# Patient Record
Sex: Male | Born: 1960 | Race: Black or African American | Hispanic: No | Marital: Single | Smoking: Current every day smoker
Health system: Southern US, Community
[De-identification: ages and names within clinical notes are randomized; demographics above are authoritative.]

## PROBLEM LIST (undated history)

## (undated) DIAGNOSIS — I10 Essential (primary) hypertension: Secondary | ICD-10-CM

---

## 2006-02-06 ENCOUNTER — Ambulatory Visit: Payer: Self-pay | Admitting: Cardiology

## 2006-02-11 ENCOUNTER — Ambulatory Visit: Payer: Self-pay | Admitting: Cardiology

## 2006-02-12 ENCOUNTER — Ambulatory Visit: Payer: Self-pay | Admitting: Cardiology

## 2009-02-07 ENCOUNTER — Emergency Department (HOSPITAL_COMMUNITY): Admission: EM | Admit: 2009-02-07 | Discharge: 2009-02-07 | Payer: Self-pay | Admitting: Emergency Medicine

## 2009-02-08 ENCOUNTER — Ambulatory Visit: Payer: Self-pay | Admitting: Cardiology

## 2019-12-23 ENCOUNTER — Encounter (HOSPITAL_COMMUNITY): Payer: Self-pay | Admitting: Emergency Medicine

## 2019-12-23 ENCOUNTER — Other Ambulatory Visit: Payer: Self-pay

## 2019-12-23 ENCOUNTER — Inpatient Hospital Stay (HOSPITAL_COMMUNITY)
Admission: EM | Admit: 2019-12-23 | Discharge: 2019-12-31 | DRG: 474 | Disposition: A | Discharge status: Rehabilitation Facility | Payer: Medicaid Other | Attending: Internal Medicine | Admitting: Internal Medicine

## 2019-12-23 ENCOUNTER — Emergency Department (HOSPITAL_COMMUNITY): Payer: Medicaid Other

## 2019-12-23 DIAGNOSIS — I739 Peripheral vascular disease, unspecified: Secondary | ICD-10-CM

## 2019-12-23 DIAGNOSIS — F10230 Alcohol dependence with withdrawal, uncomplicated: Secondary | ICD-10-CM | POA: Diagnosis not present

## 2019-12-23 DIAGNOSIS — Z7902 Long term (current) use of antithrombotics/antiplatelets: Secondary | ICD-10-CM

## 2019-12-23 DIAGNOSIS — R739 Hyperglycemia, unspecified: Secondary | ICD-10-CM | POA: Diagnosis not present

## 2019-12-23 DIAGNOSIS — I70202 Unspecified atherosclerosis of native arteries of extremities, left leg: Secondary | ICD-10-CM | POA: Diagnosis present

## 2019-12-23 DIAGNOSIS — E538 Deficiency of other specified B group vitamins: Secondary | ICD-10-CM

## 2019-12-23 DIAGNOSIS — Z89612 Acquired absence of left leg above knee: Secondary | ICD-10-CM

## 2019-12-23 DIAGNOSIS — Z6826 Body mass index (BMI) 26.0-26.9, adult: Secondary | ICD-10-CM | POA: Diagnosis not present

## 2019-12-23 DIAGNOSIS — F1010 Alcohol abuse, uncomplicated: Secondary | ICD-10-CM | POA: Diagnosis not present

## 2019-12-23 DIAGNOSIS — I998 Other disorder of circulatory system: Secondary | ICD-10-CM | POA: Diagnosis present

## 2019-12-23 DIAGNOSIS — Y901 Blood alcohol level of 20-39 mg/100 ml: Secondary | ICD-10-CM | POA: Diagnosis present

## 2019-12-23 DIAGNOSIS — E43 Unspecified severe protein-calorie malnutrition: Secondary | ICD-10-CM | POA: Diagnosis present

## 2019-12-23 DIAGNOSIS — R7989 Other specified abnormal findings of blood chemistry: Secondary | ICD-10-CM | POA: Diagnosis present

## 2019-12-23 DIAGNOSIS — D473 Essential (hemorrhagic) thrombocythemia: Secondary | ICD-10-CM | POA: Diagnosis not present

## 2019-12-23 DIAGNOSIS — L89159 Pressure ulcer of sacral region, unspecified stage: Secondary | ICD-10-CM

## 2019-12-23 DIAGNOSIS — I96 Gangrene, not elsewhere classified: Secondary | ICD-10-CM | POA: Diagnosis present

## 2019-12-23 DIAGNOSIS — F17210 Nicotine dependence, cigarettes, uncomplicated: Secondary | ICD-10-CM | POA: Diagnosis present

## 2019-12-23 DIAGNOSIS — D75839 Thrombocytosis, unspecified: Secondary | ICD-10-CM

## 2019-12-23 DIAGNOSIS — I10 Essential (primary) hypertension: Secondary | ICD-10-CM | POA: Diagnosis not present

## 2019-12-23 DIAGNOSIS — G629 Polyneuropathy, unspecified: Secondary | ICD-10-CM | POA: Diagnosis present

## 2019-12-23 DIAGNOSIS — Z888 Allergy status to other drugs, medicaments and biological substances status: Secondary | ICD-10-CM

## 2019-12-23 DIAGNOSIS — L97429 Non-pressure chronic ulcer of left heel and midfoot with unspecified severity: Secondary | ICD-10-CM | POA: Diagnosis present

## 2019-12-23 DIAGNOSIS — K5900 Constipation, unspecified: Secondary | ICD-10-CM | POA: Diagnosis not present

## 2019-12-23 DIAGNOSIS — L89152 Pressure ulcer of sacral region, stage 2: Secondary | ICD-10-CM | POA: Diagnosis not present

## 2019-12-23 DIAGNOSIS — F10239 Alcohol dependence with withdrawal, unspecified: Secondary | ICD-10-CM | POA: Diagnosis present

## 2019-12-23 DIAGNOSIS — E872 Acidosis, unspecified: Secondary | ICD-10-CM

## 2019-12-23 DIAGNOSIS — Z7982 Long term (current) use of aspirin: Secondary | ICD-10-CM

## 2019-12-23 DIAGNOSIS — I70234 Atherosclerosis of native arteries of right leg with ulceration of heel and midfoot: Secondary | ICD-10-CM | POA: Diagnosis not present

## 2019-12-23 DIAGNOSIS — L89312 Pressure ulcer of right buttock, stage 2: Secondary | ICD-10-CM | POA: Diagnosis present

## 2019-12-23 DIAGNOSIS — Z20828 Contact with and (suspected) exposure to other viral communicable diseases: Secondary | ICD-10-CM | POA: Diagnosis present

## 2019-12-23 DIAGNOSIS — D539 Nutritional anemia, unspecified: Secondary | ICD-10-CM | POA: Diagnosis present

## 2019-12-23 DIAGNOSIS — L97529 Non-pressure chronic ulcer of other part of left foot with unspecified severity: Secondary | ICD-10-CM | POA: Diagnosis not present

## 2019-12-23 DIAGNOSIS — E162 Hypoglycemia, unspecified: Secondary | ICD-10-CM

## 2019-12-23 DIAGNOSIS — M869 Osteomyelitis, unspecified: Secondary | ICD-10-CM

## 2019-12-23 DIAGNOSIS — E785 Hyperlipidemia, unspecified: Secondary | ICD-10-CM | POA: Diagnosis present

## 2019-12-23 DIAGNOSIS — L89322 Pressure ulcer of left buttock, stage 2: Secondary | ICD-10-CM | POA: Diagnosis not present

## 2019-12-23 DIAGNOSIS — D62 Acute posthemorrhagic anemia: Secondary | ICD-10-CM | POA: Diagnosis present

## 2019-12-23 DIAGNOSIS — I7025 Atherosclerosis of native arteries of other extremities with ulceration: Secondary | ICD-10-CM | POA: Diagnosis present

## 2019-12-23 DIAGNOSIS — F10939 Alcohol use, unspecified with withdrawal, unspecified: Secondary | ICD-10-CM

## 2019-12-23 DIAGNOSIS — L97509 Non-pressure chronic ulcer of other part of unspecified foot with unspecified severity: Secondary | ICD-10-CM

## 2019-12-23 DIAGNOSIS — Z4781 Encounter for orthopedic aftercare following surgical amputation: Secondary | ICD-10-CM | POA: Diagnosis not present

## 2019-12-23 DIAGNOSIS — M86171 Other acute osteomyelitis, right ankle and foot: Principal | ICD-10-CM | POA: Diagnosis present

## 2019-12-23 DIAGNOSIS — Z79899 Other long term (current) drug therapy: Secondary | ICD-10-CM

## 2019-12-23 DIAGNOSIS — E871 Hypo-osmolality and hyponatremia: Secondary | ICD-10-CM | POA: Diagnosis present

## 2019-12-23 DIAGNOSIS — I70262 Atherosclerosis of native arteries of extremities with gangrene, left leg: Secondary | ICD-10-CM | POA: Diagnosis not present

## 2019-12-23 HISTORY — DX: Essential (primary) hypertension: I10

## 2019-12-23 LAB — CBG MONITORING, ED
Glucose-Capillary: 62 mg/dL — ABNORMAL LOW (ref 70–99)
Glucose-Capillary: 128 mg/dL — ABNORMAL HIGH (ref 70–99)

## 2019-12-23 LAB — CBC WITH DIFFERENTIAL/PLATELET
Abs Immature Granulocytes: 0.05 10*3/uL (ref 0.00–0.07)
Basophils Absolute: 0.1 10*3/uL (ref 0.0–0.1)
Basophils Relative: 1 %
Eosinophils Absolute: 0 10*3/uL (ref 0.0–0.5)
Eosinophils Relative: 0 %
HCT: 29.8 % — ABNORMAL LOW (ref 39.0–52.0)
Hemoglobin: 9.7 g/dL — ABNORMAL LOW (ref 13.0–17.0)
Immature Granulocytes: 1 %
Lymphocytes Relative: 16 %
Lymphs Abs: 1.2 10*3/uL (ref 0.7–4.0)
MCH: 33 pg (ref 26.0–34.0)
MCHC: 32.6 g/dL (ref 30.0–36.0)
MCV: 101.4 fL — ABNORMAL HIGH (ref 80.0–100.0)
Monocytes Absolute: 0.6 10*3/uL (ref 0.1–1.0)
Monocytes Relative: 8 %
Neutro Abs: 5.3 10*3/uL (ref 1.7–7.7)
Neutrophils Relative %: 74 %
Platelets: 533 10*3/uL — ABNORMAL HIGH (ref 150–400)
RBC: 2.94 MIL/uL — ABNORMAL LOW (ref 4.22–5.81)
RDW: 17.6 % — ABNORMAL HIGH (ref 11.5–15.5)
WBC: 7.1 10*3/uL (ref 4.0–10.5)
nRBC: 0 % (ref 0.0–0.2)

## 2019-12-23 LAB — BASIC METABOLIC PANEL
Anion gap: >20 — ABNORMAL HIGH (ref 5–15)
BUN: 8 mg/dL (ref 6–20)
CO2: 16 mmol/L — ABNORMAL LOW (ref 22–32)
Calcium: 8.5 mg/dL — ABNORMAL LOW (ref 8.9–10.3)
Chloride: 95 mmol/L — ABNORMAL LOW (ref 98–111)
Creatinine, Ser: 0.62 mg/dL (ref 0.61–1.24)
GFR calc Af Amer: >60 mL/min (ref >60)
GFR calc non Af Amer: >60 mL/min (ref >60)
Glucose, Bld: 59 mg/dL — ABNORMAL LOW (ref 70–99)
Potassium: 3.7 mmol/L (ref 3.5–5.1)
Sodium: 132 mmol/L — ABNORMAL LOW (ref 135–145)

## 2019-12-23 LAB — LACTIC ACID, PLASMA
Lactic Acid, Venous: 5.4 mmol/L (ref 0.5–1.9)
Lactic Acid, Venous: 2.1 mmol/L (ref 0.5–1.9)

## 2019-12-23 LAB — RESPIRATORY PANEL BY RT PCR (FLU A&B, COVID)
Influenza A by PCR: NEGATIVE
Influenza B by PCR: NEGATIVE
SARS Coronavirus 2 by RT PCR: NEGATIVE

## 2019-12-23 LAB — ETHANOL: Alcohol, Ethyl (B): 23 mg/dL — ABNORMAL HIGH (ref <10)

## 2019-12-23 MED ORDER — THIAMINE HCL 100 MG PO TABS
100.0000 mg | ORAL_TABLET | Freq: Every day | ORAL | Status: DC
  Administered 2019-12-23 – 2019-12-31 (×7): 100 mg via ORAL
  Filled 2019-12-23 (×8): qty 1

## 2019-12-23 MED ORDER — SODIUM CHLORIDE 0.9 % IV BOLUS
1000.0000 mL | Freq: Once | INTRAVENOUS | Status: AC
  Administered 2019-12-23: 1000 mL via INTRAVENOUS

## 2019-12-23 MED ORDER — LORAZEPAM 2 MG/ML IJ SOLN: 0.0000 mg | Freq: Four times a day (QID) | INTRAMUSCULAR | Status: DC

## 2019-12-23 MED ORDER — VANCOMYCIN HCL 1500 MG/300ML IV SOLN
1500.0000 mg | Freq: Once | INTRAVENOUS | Status: AC
  Administered 2019-12-23: 1500 mg via INTRAVENOUS
  Filled 2019-12-23: qty 300

## 2019-12-23 MED ORDER — LORAZEPAM 1 MG PO TABS
0.0000 mg | ORAL_TABLET | Freq: Two times a day (BID) | ORAL | Status: AC
  Administered 2019-12-26: 1 mg via ORAL
  Filled 2019-12-23: qty 1

## 2019-12-23 MED ORDER — CEFAZOLIN SODIUM-DEXTROSE 1-4 GM/50ML-% IV SOLN
2.0000 g | Freq: Once | INTRAVENOUS | Status: DC
  Filled 2019-12-23 (×2): qty 100

## 2019-12-23 MED ORDER — THIAMINE HCL 100 MG/ML IJ SOLN: 100.0000 mg | Freq: Every day | INTRAMUSCULAR | Status: DC

## 2019-12-23 MED ORDER — ACETAMINOPHEN 325 MG PO TABS
650.0000 mg | ORAL_TABLET | Freq: Four times a day (QID) | ORAL | Status: DC | PRN
  Administered 2019-12-24 – 2019-12-28 (×6): 650 mg via ORAL
  Filled 2019-12-23 (×6): qty 2

## 2019-12-23 MED ORDER — LORAZEPAM 2 MG/ML IJ SOLN
0.0000 mg | Freq: Two times a day (BID) | INTRAMUSCULAR | Status: AC
  Administered 2019-12-27: 1 mg via INTRAVENOUS
  Administered 2019-12-27: 2 mg via INTRAVENOUS
  Filled 2019-12-23 (×2): qty 1

## 2019-12-23 MED ORDER — LORAZEPAM 1 MG PO TABS: 0.0000 mg | ORAL_TABLET | Freq: Four times a day (QID) | ORAL | Status: DC

## 2019-12-23 MED ORDER — MORPHINE SULFATE (PF) 4 MG/ML IV SOLN
4.0000 mg | Freq: Once | INTRAVENOUS | Status: AC
  Administered 2019-12-23: 4 mg via INTRAVENOUS
  Filled 2019-12-23: qty 1

## 2019-12-23 MED ORDER — SODIUM CHLORIDE 0.9 % IV SOLN
2.0000 g | Freq: Once | INTRAVENOUS | Status: AC
  Administered 2019-12-23: 2 g via INTRAVENOUS
  Filled 2019-12-23: qty 2

## 2019-12-23 MED ORDER — ACETAMINOPHEN 650 MG RE SUPP: 650.0000 mg | Freq: Four times a day (QID) | RECTAL | Status: DC | PRN

## 2019-12-23 MED ORDER — VANCOMYCIN HCL IN DEXTROSE 1-5 GM/200ML-% IV SOLN
1000.0000 mg | Freq: Two times a day (BID) | INTRAVENOUS | Status: DC
  Administered 2019-12-24 – 2019-12-29 (×10): 1000 mg via INTRAVENOUS
  Filled 2019-12-23 (×11): qty 200

## 2019-12-23 NOTE — ED Notes (Signed)
Date and time results received: 12/23/19 2150   Test: Lactic Critical Value: 2.1  Name of Provider Notified: Josephine Cables, DO

## 2019-12-23 NOTE — Progress Notes (Addendum)
Pharmacy Antibiotic Note  Terrence Carr is a 58 y.o. male admitted on 12/23/2019 with wound infection.  Pharmacy has been consulted for vancomycin dosing.  Presenting with multiple wounds to bilateral extremities with noted foul odor and drainage. WBC 7.1, afebrile. Scr 0.62 (CrCl 88 mL/min).   Plan: Vancomycin 1500 mg IV once then 1 g IV every 12 hours (estAUC 476)  Monitor renal fx, cx results, clinical pic, and vanc levels as appropriate  Height: 5\' 6"  (167.6 cm) Weight: 165 lb (74.8 kg) IBW/kg (Calculated) : 63.8  Temp (24hrs), Avg:97.9 F (36.6 C), Min:97.9 F (36.6 C), Max:97.9 F (36.6 C)  Recent Labs  Lab 12/23/19 1854  WBC 7.1  CREATININE 0.62  LATICACIDVEN 5.4*    Estimated Creatinine Clearance: 88.6 mL/min (by C-G formula based on SCr of 0.62 mg/dL).    Not on File  Antimicrobials this admission: Vancomycin 12/23 >>  Cefepime 12/23 x1  Dose adjustments this admission: N/A  Microbiology results: N/A  Thank you for allowing pharmacy to be a part of this patient's care.  Antonietta Jewel, PharmD, BCCCP Clinical Pharmacist   Please check AMION for all [facility] phone numbers 12/23/2019 7:46 PM

## 2019-12-23 NOTE — ED Triage Notes (Signed)
Pt from home with girlfriend. Present with multiple wounds to both legs and feet.  Foul odor, drainage noted

## 2019-12-23 NOTE — H&P (Signed)
History and Physical  Terrence Carr [id number] DOB: Jul 23, 1961 DOA: 12/23/2019  Referring physician: Rodell Perna PA-C PCP: Patient, No Pcp Per  Patient coming from: Home  Chief Complaint: Bilateral wounds and pain on legs   HPI: Terrence Carr is a 58 y.o. male with medical history significant for hypertension, peripheral arterial occlusive disease, tobacco use disorder, alcohol abuse, peripheral neuropathy, vitamin B12 deficiency and history of severe protein calorie malnutrition who presents to the emergency department due to 3-week history of progressive and worsening wounds to bilateral lower extremities, patient complains of bilateral lower extremity wounds with yellowish and malodorous discharge, he complained of severe pain from the wounds.  Patient states that PCP prescribed antibiotic for the wounds, but he has not taken antibiotics since he cannot remember the name.  Patient was counseled against consequences of tobacco abuse including lower extremity amputatation based on his history of peripheral arterial disease by a physician, he states that he has cut down to half a pack of cigarettes daily, however he continues to drink alcohol (about a pint of vodka daily).  Patient states that he has been treating his wounds with Epson salt soaks and Goody powder without any improvement.  He denies chest pain, shortness of breath, fever, nausea, vomiting or abdominal pain. Of note, patient appears to have been seen by vascular surgery at [facility] as an outpatient on 09/02/2019 was noted to be status post right femoral endarterectomy and right iliac artery stent placement by Dr. Radene Knee in May 2020 in which he had no lower extremity wounds at that time by ED medical record.  ED Course:  In the emergency department, BP was 165/60, but other vital signs were within normal range.  Work-up in the ED showed macrocytic anemia,  Hypoglycemia, hyponatremia,  lactic acid 5.4> 2.1, alcohol level was 23.   COVID-19 virus test, influenza A and B were negative.  Right tibia and fibula showed diffuse edematous thickening of the RLE with a large ankle effusion and subcortical lucency at the distal tibial metaphysis worrisome for osteomyelitis.  Left foot x-ray showed soft tissue ulceration along the plantar aspect of the heel and posterolateral aspect of the foot.  Patient was started on IV cefepime and vancomycin.  Patient was placed on CIWA protocol and IV hydration of 1 L NS was given.  Dr. Donnetta Hutching (vascular surgeon at [facility]) was consulted per ED PA who states that it was recommended for patient to be transferred to [facility] with plan to see me in the morning.  Hospitalist was asked to admit for further evaluation management.   Review of Systems: Constitutional: Negative for chills and fever.  HENT: Negative for ear pain and sore throat.   Eyes: Negative for pain and visual disturbance.  Respiratory: Negative for cough, chest tightness and shortness of breath.   Cardiovascular: Negative for chest pain and palpitations.  Gastrointestinal: Negative for abdominal pain and vomiting.  Endocrine: Negative for polyphagia and polyuria.  Genitourinary: Negative for decreased urine volume, dysuria, enuresis Musculoskeletal: Positive for  Leg wounds and swelling. Negative for back pain.  Skin: Positive for wound.  Allergic/Immunologic: Negative for immunocompromised state.  Neurological: Negative for tremors, syncope, speech difficulty.  Hematological: Does not bruise/bleed easily.  Review of systems as noted in the HPI. All other systems reviewed and are negative.   History reviewed. No pertinent past medical history. History reviewed. No pertinent surgical history.  Social History:  reports that he has never smoked. He has never used smokeless tobacco.  He reports that he does not drink alcohol or use drugs.   Not on File  History reviewed. No pertinent family history.   Prior to Admission medications    Not on File    Physical Exam: BP (!) 160/83   Pulse 99   Temp 99.9 F (37.7 C) (Rectal)   Resp (!) 21   Ht 5\' 6"  (1.676 m)   Wt 74.8 kg   SpO2 97%   BMI 26.63 kg/m   . General: 58 y.o. year-old male well developed well nourished in no acute distress.  Alert and oriented x3. Marland Kitchen HEENT: Normocephalic, atraumatic . NECK: Supple, trachea midline . Cardiovascular: Regular rate and rhythm with no rubs or gallops.  No thyromegaly or JVD noted.  Bilateral +2 extremity edema. 1/4 pulses in  Right dorsalis pedis, left dorsalis pedis palpable only on doppler. Marland Kitchen Respiratory: Clear to auscultation with no wheezes or rales. Good inspiratory effort. . Abdomen: Soft nontender nondistended with normal bowel sounds x4 quadrants. . Muskuloskeletal: Bilateral lower extremity edema and wounds with malodorous purulent discharge.   . Neuro: CN II-XII intact, strength, sensation, reflexes . Skin: Numerous skin lesions with weeping and malodorous purulent discharge.   Marland Kitchen Psychiatry: Judgement and insight appear normal. Mood is appropriate for condition and setting          Labs on Admission:  Basic Metabolic Panel: Recent Labs  Lab 12/23/19 1854  NA 132*  K 3.7  CL 95*  CO2 16*  GLUCOSE 59*  BUN 8  CREATININE 0.62  CALCIUM 8.5*   Liver Function Tests: No results for input(s): AST, ALT, ALKPHOS, BILITOT, PROT, ALBUMIN in the last 168 hours. No results for input(s): LIPASE, AMYLASE in the last 168 hours. No results for input(s): AMMONIA in the last 168 hours. CBC: Recent Labs  Lab 12/23/19 1854  WBC 7.1  NEUTROABS 5.3  HGB 9.7*  HCT 29.8*  MCV 101.4*  PLT 533*   Cardiac Enzymes: No results for input(s): CKTOTAL, CKMB, CKMBINDEX, TROPONINI in the last 168 hours.  BNP (last 3 results) No results for input(s): BNP in the last 8760 hours.  ProBNP (last 3 results) No results for input(s): PROBNP in the last 8760 hours.  CBG: Recent Labs  Lab 12/23/19 2132 12/23/19 2312  GLUCAP  62* 128*    Radiological Exams on Admission: DG Tibia/Fibula Left  Result Date: 12/23/2019 CLINICAL DATA:  Multiple wounds. Assessment for osteomyelitis. EXAM: LEFT TIBIA AND FIBULA - 2 VIEW COMPARISON:  None FINDINGS: Diffuse mild soft tissue swelling of the left lower leg. Question some subcortical lucency along the distal fibular metadiaphysis with overlying material. If there is visible ulceration at this location, could reflect early features of osteomyelitis. No other erosion, destructive change, or immature periostitis to suggest a discrete focus of infection. Few fragments in the suprapatellar joint recess of the left knee, may be posttraumatic or degenerative in nature. Serpiginous sclerosis in the distal femur likely sequela of prior infarct. Atherosclerotic calcifications noted in the posterior knee. IMPRESSION: 1. Question subtle subcortical lucency along the distal fibular metadiaphysis with overlying bandage material. If there is visible ulceration at this location, could reflect early features of osteomyelitis. 2. No other erosion, destructive change, or immature periostitis to suggest a discrete focus of infection. Electronically Signed   By: Lovena Le M.D.   On: 12/23/2019 18:59   DG Tibia/Fibula Right  Result Date: 12/23/2019 CLINICAL DATA:  Wounds, assess for ostia EXAM: RIGHT TIBIA AND FIBULA - 2 VIEW COMPARISON:  Concurrent ankle radiographs FINDINGS: Diffuse edematous thickening of the right lower extremity which is increasing towards the level of the foot. There is circumferential swelling at the level of the ankle with a large effusion and some questionable gas suspicious lucency noted in the subcortical bone of the distal tibial metaphysis. Mild tricompartmental degenerative changes are seen at the knee. Vascular calcium in the soft tissues. No acute fracture or traumatic malalignment. IMPRESSION: 1. Diffuse edematous thickening of the right lower extremity which is increasing  towards the level of the foot. 2. There is a large ankle effusion, questionable gas in the joint, and circumferential swelling at the level of the ankle. 3. Subcortical lucency at the distal tibial metaphysis, worrisome for osteomyelitis. Electronically Signed   By: Lovena Le M.D.   On: 12/23/2019 19:09   DG Foot Complete Left  Result Date: 12/23/2019 CLINICAL DATA:  Wounds, assess for osteomyelitis EXAM: LEFT FOOT - COMPLETE 3+ VIEW COMPARISON:  None. FINDINGS: Diffuse soft tissue swelling of the foot. There is ulceration noted along the plantar aspect of the heel and posterolateral aspect of the foot. No subjacent osseous erosion, cortical lucency or immature periostitis to suggest site of osteomyelitis. No other radiographically evident site of ulceration. Vascular calcium noted in the soft tissues. Diffuse degenerative changes in the midfoot are noted most pronounced involving navicular, medial cuneiform and base of the first metatarsal. Clawtoe deformities of the second through fifth digits. IMPRESSION: 1. Soft tissue ulceration along the plantar aspect of the heel and posterolateral aspect of the foot. 2. No convincing radiographic evidence for osteomyelitis. If there is persisting concern contrast-enhanced MRI is more sensitive and specific. 3. Diffuse soft tissue swelling of the foot. 4. Diffuse degenerative changes in the midfoot. Electronically Signed   By: Lovena Le M.D.   On: 12/23/2019 19:02   DG Foot Complete Right  Result Date: 12/23/2019 CLINICAL DATA:  Wounds, assess for osteomyelitis. EXAM: RIGHT FOOT COMPLETE - 3+ VIEW COMPARISON:  None. FINDINGS: There is diffuse soft tissue swelling and ulceration of the foot at the level of the plantar calcaneus and along the medial aspect of the head of the first metatarsal. There is some transcortical lucency at the base of the first distal phalanx. The osseous structures appear diffusely demineralized which may limit detection of small or  nondisplaced fractures or subtle osseous features of osteomyelitis. There is questionable intra-articular gas at the level of the ankle. There is a severe hallux valgus deformity of the first ray with marked clawtoe deformities of the second through fifth digits. Extensive degenerative change noted through the mid and hindfoot as well as at the level of the ankle. No acute fracture or malalignment is seen. Ankle effusion is noted. IMPRESSION: 1. Diffuse soft tissue swelling and focal ulceration of the foot at the level of the plantar calcaneus and along the medial aspect of the head of the first metatarsal. 2. There is some transcortical lucency at the base of the first distal phalanx which could be secondary to osteomyelitis. 3. Question some intra-articular gas at the level of the ankle. Consider cross-sectional imaging. 4. Diffuse bony demineralization and degenerative changes, as above. Electronically Signed   By: Lovena Le M.D.   On: 12/23/2019 19:07    EKG: I independently viewed the EKG done and my findings are as followed: EKG was not done in the ED  Assessment/Plan Present on Admission: . Osteomyelitis of ankle or foot, acute, right (HCC)  Active Problems:   Osteomyelitis of ankle or  foot, acute, right (HCC)   Lactic acidosis   Alcohol abuse   Alcohol withdrawal (HCC)   Macrocytic anemia   Sacral decubitus ulcer   Hyperglycemia   Essential hypertension   Peripheral arterial disease (HCC)   Vitamin B12 deficiency   Thrombocytosis (HCC)   Hypoglycemia  Bilateral lower extremity wounds with suspicion for acute right ankle/foot osteomyelitis in the setting of history of peripheral arterial disease Soft tissue ulceration of left heel and posterior lateral aspect of foot X-ray of right tibia and fibula showed diffuse edematous thickening of the RLE with a large ankle effusion and subcortical lucency at the distal tibial metaphysis worrisome for osteomyelitis.   Left foot x-ray showed  soft tissue ulceration along the plantar aspect of the heel and posterolateral aspect of the foot.  Patient was started on IV vancomycin and cefepime; we shall continue with same at this time Vascular surgeon (Dr. Donnetta Hutching) at [facility] was consulted per ED PA and recommended for patient transfer for possible surgical intervention in the morning. Continue wound care Continue n.p.o. at midnight  Lactic acidosis possible secondary to above Lactic acid 5.4>2.1 s/p IV hydration Continue to trend lactic acid  Alcohol abuse/withdrawal Alcohol level was 23, patient states that he drinks about a pint of vodka daily. He denies history of alcohol withdrawal, patient was placed on CIWA protocol in the ED, we shall continue with same at this time.  Macrocytic anemia H/H 9.7/29.8, MCV 101.4  Folate and B12 level will be checked  Thrombocytosis possibly reactive Platelets from 533, stable with no obvious sign of acute bleeding Continue to monitor platelet levels with morning labs  Hypoglycemia-resolved Blood glucose on admission was 59, dysesthesias increased to 128 Continue to monitor blood glucose level  Sacral decubitus ulcer stage II Continue wound care  DVT prophylaxis: SCDs  Code Status: Full code  Family Communication: None at bedside  Disposition Plan: Transfer to [facility] for further evaluation and management.  Disposition pending surgical decision/outcome  Consults called: Vascular surgeon at [facility] per ED PA  Admission status: Inpatient    Bernadette Hoit MD [facility]  If 7PM-7AM, please contact night-coverage [email or web address]  12/24/2019, 1:00 AM

## 2019-12-23 NOTE — ED Provider Notes (Signed)
[facility] EMERGENCY DEPARTMENT Provider Note   CSN: [id number] Arrival date & time: 12/23/19  1700     History Chief Complaint  Patient presents with   Wound Check    Terrence Carr is a 58 y.o. male with history of peripheral arterial occlusive disease, hypertension, diverticulosis, tobacco use disorder, peripheral neuropathy, B12 deficiency with anemia, alcohol abuse, severe protein calorie malnutrition presents for evaluation of acute onset, progressively worsening wounds to the bilateral lower extremities for 3 weeks.  He reports severe pain to his lower extremities with wounds draining malodorous yellowish discharge, left worse than right.  Notes bilateral lower extremity edema that has also been worsening.  He states that he was seen by his PCP and is supposed to be on antibiotics but he cannot remember what antibiotics he is supposed to be on and has not been taking them.  He states that he was told by a physician at some point that "if I do not quit smoking that I will lose my legs".  He states that he has cut back on his smoking and he is now smoking around half a pack of cigarettes daily.  He also notes that he drinks around a pint of vodka daily.  Denies recreational drug use.  He has been soaking his wounds in Epson salt soaks and taking Goody's powder without improvement.  He denies fevers, chest pain, shortness of breath, abdominal pain, nausea, or vomiting.  Chart review shows that he was seen by vascular surgery through [facility] at an office visit on 09/02/2019 which notes that he is status post right femoral endarterectomy and right iliac artery stent placement by Dr. Radene Knee in March 2020.  At that time he did not have any wounds on his lower extremities.   The history is provided by the patient.       History reviewed. No pertinent past medical history.  There are no problems to display for this patient.   History reviewed. No pertinent surgical  history.     History reviewed. No pertinent family history.  Social History   Tobacco Use   Smoking status: Never Smoker   Smokeless tobacco: Never Used  Substance Use Topics   Alcohol use: Never   Drug use: Never    Home Medications Prior to Admission medications   Not on File    Allergies    Patient has no allergy information on record.  Review of Systems   Review of Systems  Constitutional: Negative for chills and fever.  Respiratory: Negative for shortness of breath.   Cardiovascular: Positive for leg swelling. Negative for chest pain.  Gastrointestinal: Negative for abdominal pain, nausea and vomiting.  Skin: Positive for wound.  All other systems reviewed and are negative.   Physical Exam Updated Vital Signs BP (!) 152/115    Pulse 98    Temp 97.9 F (36.6 C) (Oral)    Resp (!) 23    Ht 5\' 6"  (1.676 m)    Wt 74.8 kg    SpO2 100%    BMI 26.63 kg/m   Physical Exam Vitals and nursing note reviewed.  Constitutional:      General: He is not in acute distress.    Appearance: He is well-developed.  HENT:     Head: Normocephalic and atraumatic.  Eyes:     General:        Right eye: No discharge.        Left eye: No discharge.     Conjunctiva/sclera:  Conjunctivae normal.  Neck:     Vascular: No JVD.     Trachea: No tracheal deviation.  Cardiovascular:     Rate and Rhythm: Normal rate.     Comments: pitting edema of the bilateral lower extremities noted, right greater than left.  1+ DP pulse on the right, left DP pulse is not palpable but present on Doppler.  Numerous skin lesions with some necrosis, others weeping malodorous purulent discharge. Pulmonary:     Effort: Pulmonary effort is normal.  Abdominal:     General: There is no distension.  Musculoskeletal:     Cervical back: Neck supple.  Skin:    General: Skin is warm and dry.     Findings: Lesion present.  Neurological:     Mental Status: He is alert.  Psychiatric:        Behavior:  Behavior normal.           ED Results / Procedures / Treatments   Labs (all labs ordered are listed, but only abnormal results are displayed) Labs Reviewed  BASIC METABOLIC PANEL - Abnormal; Notable for the following components:      Result Value   Sodium 132 (*)    Chloride 95 (*)    CO2 16 (*)    Glucose, Bld 59 (*)    Calcium 8.5 (*)    Anion gap >20 (*)    All other components within normal limits  CBC WITH DIFFERENTIAL/PLATELET - Abnormal; Notable for the following components:   RBC 2.94 (*)    Hemoglobin 9.7 (*)    HCT 29.8 (*)    MCV 101.4 (*)    RDW 17.6 (*)    Platelets 533 (*)    All other components within normal limits  LACTIC ACID, PLASMA - Abnormal; Notable for the following components:   Lactic Acid, Venous 5.4 (*)    All other components within normal limits  ETHANOL - Abnormal; Notable for the following components:   Alcohol, Ethyl (B) 23 (*)    All other components within normal limits  RESPIRATORY PANEL BY RT PCR (FLU A&B, COVID)  LACTIC ACID, PLASMA  CBG MONITORING, ED    EKG None  Radiology DG Tibia/Fibula Left  Result Date: 12/23/2019 CLINICAL DATA:  Multiple wounds. Assessment for osteomyelitis. EXAM: LEFT TIBIA AND FIBULA - 2 VIEW COMPARISON:  None FINDINGS: Diffuse mild soft tissue swelling of the left lower leg. Question some subcortical lucency along the distal fibular metadiaphysis with overlying material. If there is visible ulceration at this location, could reflect early features of osteomyelitis. No other erosion, destructive change, or immature periostitis to suggest a discrete focus of infection. Few fragments in the suprapatellar joint recess of the left knee, may be posttraumatic or degenerative in nature. Serpiginous sclerosis in the distal femur likely sequela of prior infarct. Atherosclerotic calcifications noted in the posterior knee. IMPRESSION: 1. Question subtle subcortical lucency along the distal fibular metadiaphysis with  overlying bandage material. If there is visible ulceration at this location, could reflect early features of osteomyelitis. 2. No other erosion, destructive change, or immature periostitis to suggest a discrete focus of infection. Electronically Signed   By: Lovena Le M.D.   On: 12/23/2019 18:59   DG Tibia/Fibula Right  Result Date: 12/23/2019 CLINICAL DATA:  Wounds, assess for ostia EXAM: RIGHT TIBIA AND FIBULA - 2 VIEW COMPARISON:  Concurrent ankle radiographs FINDINGS: Diffuse edematous thickening of the right lower extremity which is increasing towards the level of the foot. There is circumferential  swelling at the level of the ankle with a large effusion and some questionable gas suspicious lucency noted in the subcortical bone of the distal tibial metaphysis. Mild tricompartmental degenerative changes are seen at the knee. Vascular calcium in the soft tissues. No acute fracture or traumatic malalignment. IMPRESSION: 1. Diffuse edematous thickening of the right lower extremity which is increasing towards the level of the foot. 2. There is a large ankle effusion, questionable gas in the joint, and circumferential swelling at the level of the ankle. 3. Subcortical lucency at the distal tibial metaphysis, worrisome for osteomyelitis. Electronically Signed   By: Lovena Le M.D.   On: 12/23/2019 19:09   DG Foot Complete Left  Result Date: 12/23/2019 CLINICAL DATA:  Wounds, assess for osteomyelitis EXAM: LEFT FOOT - COMPLETE 3+ VIEW COMPARISON:  None. FINDINGS: Diffuse soft tissue swelling of the foot. There is ulceration noted along the plantar aspect of the heel and posterolateral aspect of the foot. No subjacent osseous erosion, cortical lucency or immature periostitis to suggest site of osteomyelitis. No other radiographically evident site of ulceration. Vascular calcium noted in the soft tissues. Diffuse degenerative changes in the midfoot are noted most pronounced involving navicular, medial  cuneiform and base of the first metatarsal. Clawtoe deformities of the second through fifth digits. IMPRESSION: 1. Soft tissue ulceration along the plantar aspect of the heel and posterolateral aspect of the foot. 2. No convincing radiographic evidence for osteomyelitis. If there is persisting concern contrast-enhanced MRI is more sensitive and specific. 3. Diffuse soft tissue swelling of the foot. 4. Diffuse degenerative changes in the midfoot. Electronically Signed   By: Lovena Le M.D.   On: 12/23/2019 19:02   DG Foot Complete Right  Result Date: 12/23/2019 CLINICAL DATA:  Wounds, assess for osteomyelitis. EXAM: RIGHT FOOT COMPLETE - 3+ VIEW COMPARISON:  None. FINDINGS: There is diffuse soft tissue swelling and ulceration of the foot at the level of the plantar calcaneus and along the medial aspect of the head of the first metatarsal. There is some transcortical lucency at the base of the first distal phalanx. The osseous structures appear diffusely demineralized which may limit detection of small or nondisplaced fractures or subtle osseous features of osteomyelitis. There is questionable intra-articular gas at the level of the ankle. There is a severe hallux valgus deformity of the first ray with marked clawtoe deformities of the second through fifth digits. Extensive degenerative change noted through the mid and hindfoot as well as at the level of the ankle. No acute fracture or malalignment is seen. Ankle effusion is noted. IMPRESSION: 1. Diffuse soft tissue swelling and focal ulceration of the foot at the level of the plantar calcaneus and along the medial aspect of the head of the first metatarsal. 2. There is some transcortical lucency at the base of the first distal phalanx which could be secondary to osteomyelitis. 3. Question some intra-articular gas at the level of the ankle. Consider cross-sectional imaging. 4. Diffuse bony demineralization and degenerative changes, as above. Electronically  Signed   By: Lovena Le M.D.   On: 12/23/2019 19:07    Procedures .Critical Care Performed by: Renita Papa, PA-C Authorized by: Renita Papa, PA-C   Critical care provider statement:    Critical care time (minutes):  45   Critical care was necessary to treat or prevent imminent or life-threatening deterioration of the following conditions:  Circulatory failure (ischemic limb)   Critical care was time spent personally by me on the following activities:  Discussions with consultants, evaluation of patient's response to treatment, examination of patient, ordering and performing treatments and interventions, ordering and review of laboratory studies, ordering and review of radiographic studies, pulse oximetry, re-evaluation of patient's condition, obtaining history from patient or surrogate and review of old charts   (including critical care time)  Medications Ordered in ED Medications  vancomycin (VANCOREADY) IVPB 1500 mg/300 mL (1,500 mg Intravenous New Bag/Given 12/23/19 2010)  vancomycin (VANCOCIN) IVPB 1000 mg/200 mL premix (has no administration in time range)  morphine 4 MG/ML injection 4 mg (4 mg Intravenous Given 12/23/19 1818)  sodium chloride 0.9 % bolus 1,000 mL (1,000 mLs Intravenous New Bag/Given 12/23/19 2011)  ceFEPIme (MAXIPIME) 2 g in sodium chloride 0.9 % 100 mL IVPB (2 g Intravenous New Bag/Given 12/23/19 2053)    ED Course  I have reviewed the triage vital signs and the nursing notes.  Pertinent labs & imaging results that were available during my care of the patient were reviewed by me and considered in my medical decision making (see chart for details).    MDM Rules/Calculators/A&P                      Patient presenting for progressively worsening edema and wounds to the bilateral lower extremities, left worse than right.  Has a history of a right femoral endarterectomy and right iliac artery stent placement by a vascular surgeon at [facility] in  March 2020 has had some follow-up with vascular surgery there outpatient since then.  Most recently had follow-up in September of this year with no mention of lower extremity wounds.  He is afebrile, persistently hypertensive in the ED.  He is uncomfortable but nontoxic in appearance.  Diminished DP pulse on the right, unable to palpate DP pulse on the left but it is dopplerable.  Wounds are concerning for both wet and dry gangrene particularly along the lateral left foot and ankle.  Will obtain lab work and radiographs to assess for possible bony involvement.  Lab work reviewed by me concerning for anemia with hemoglobin 9.7, elevated anion gap of 20 and elevated lactate of 5.4.  The latter 2 findings are likely in the setting of ischemia and he has no leukocytosis or fever to suggest sepsis at this time.  His ethanol is mildly elevated and he tells me he typically drinks a pint of vodka daily.  Will initiate CIWA protocol.  He does not appear to be withdrawing at this time.  Radiographs are concerning for possible osteomyelitis at the base of the first distal phalanx of the right foot as well as left distal fibular metadiaphysis.  Will start on vancomycin and cefepime.  CONSULT: Spoke with Dr. Donnetta Hutching with vascular surgery who is reviewed the patient's images and pictures attached to the patient's chart.  Unfortunately the patient will likely have to undergo bilateral lower extremity amputations lower extremity amputations certainly on the left though there could be some possibility of revitalization on the right. He recommends transfer to [facility], keep NPO after midnight, plan for surgery tomorrow.   Spoke with Dr. Josephine Cables with [facility] who agrees to assume care of patient and arrange for transfer to [facility]. COVID test is pending.   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

## 2019-12-24 ENCOUNTER — Encounter (HOSPITAL_COMMUNITY)
Admission: EM | Disposition: A | Discharge status: Rehabilitation Facility | Payer: Self-pay | Source: Home / Self Care | Attending: Internal Medicine

## 2019-12-24 ENCOUNTER — Encounter (HOSPITAL_COMMUNITY): Payer: Self-pay | Admitting: Internal Medicine

## 2019-12-24 ENCOUNTER — Inpatient Hospital Stay (HOSPITAL_COMMUNITY): Payer: Medicaid Other | Admitting: Anesthesiology

## 2019-12-24 DIAGNOSIS — D75839 Thrombocytosis, unspecified: Secondary | ICD-10-CM

## 2019-12-24 DIAGNOSIS — D473 Essential (hemorrhagic) thrombocythemia: Secondary | ICD-10-CM

## 2019-12-24 DIAGNOSIS — E162 Hypoglycemia, unspecified: Secondary | ICD-10-CM

## 2019-12-24 DIAGNOSIS — I70262 Atherosclerosis of native arteries of extremities with gangrene, left leg: Secondary | ICD-10-CM

## 2019-12-24 DIAGNOSIS — I70234 Atherosclerosis of native arteries of right leg with ulceration of heel and midfoot: Secondary | ICD-10-CM

## 2019-12-24 HISTORY — PX: AMPUTATION: SHX166

## 2019-12-24 LAB — COMPREHENSIVE METABOLIC PANEL
ALT: 21 U/L (ref 0–44)
AST: 49 U/L — ABNORMAL HIGH (ref 15–41)
Albumin: 1.6 g/dL — ABNORMAL LOW (ref 3.5–5.0)
Alkaline Phosphatase: 189 U/L — ABNORMAL HIGH (ref 38–126)
Anion gap: 11 (ref 5–15)
BUN: 8 mg/dL (ref 6–20)
CO2: 22 mmol/L (ref 22–32)
Calcium: 7.9 mg/dL — ABNORMAL LOW (ref 8.9–10.3)
Chloride: 97 mmol/L — ABNORMAL LOW (ref 98–111)
Creatinine, Ser: 0.71 mg/dL (ref 0.61–1.24)
GFR calc Af Amer: >60 mL/min (ref >60)
GFR calc non Af Amer: >60 mL/min (ref >60)
Glucose, Bld: 100 mg/dL — ABNORMAL HIGH (ref 70–99)
Potassium: 3.9 mmol/L (ref 3.5–5.1)
Sodium: 130 mmol/L — ABNORMAL LOW (ref 135–145)
Total Bilirubin: 1 mg/dL (ref 0.3–1.2)
Total Protein: 5.3 g/dL — ABNORMAL LOW (ref 6.5–8.1)

## 2019-12-24 LAB — CBC WITH DIFFERENTIAL/PLATELET
Abs Immature Granulocytes: 0.04 10*3/uL (ref 0.00–0.07)
Basophils Absolute: 0 10*3/uL (ref 0.0–0.1)
Basophils Relative: 0 %
Eosinophils Absolute: 0 10*3/uL (ref 0.0–0.5)
Eosinophils Relative: 0 %
HCT: 24.5 % — ABNORMAL LOW (ref 39.0–52.0)
Hemoglobin: 8.3 g/dL — ABNORMAL LOW (ref 13.0–17.0)
Immature Granulocytes: 1 %
Lymphocytes Relative: 10 %
Lymphs Abs: 0.7 10*3/uL (ref 0.7–4.0)
MCH: 32.9 pg (ref 26.0–34.0)
MCHC: 33.9 g/dL (ref 30.0–36.0)
MCV: 97.2 fL (ref 80.0–100.0)
Monocytes Absolute: 0.5 10*3/uL (ref 0.1–1.0)
Monocytes Relative: 7 %
Neutro Abs: 5.9 10*3/uL (ref 1.7–7.7)
Neutrophils Relative %: 82 %
Platelets: 467 10*3/uL — ABNORMAL HIGH (ref 150–400)
RBC: 2.52 MIL/uL — ABNORMAL LOW (ref 4.22–5.81)
RDW: 17.2 % — ABNORMAL HIGH (ref 11.5–15.5)
WBC: 7.1 10*3/uL (ref 4.0–10.5)
nRBC: 0 % (ref 0.0–0.2)

## 2019-12-24 LAB — TYPE AND SCREEN
ABO/RH(D): O POS
Antibody Screen: NEGATIVE

## 2019-12-24 LAB — MAGNESIUM
Magnesium: 1.9 mg/dL (ref 1.7–2.4)
Magnesium: 2 mg/dL (ref 1.7–2.4)

## 2019-12-24 LAB — HIV ANTIBODY (ROUTINE TESTING W REFLEX): HIV Screen 4th Generation wRfx: NONREACTIVE

## 2019-12-24 LAB — C-REACTIVE PROTEIN: CRP: 22.7 mg/dL — ABNORMAL HIGH (ref <1.0)

## 2019-12-24 LAB — SEDIMENTATION RATE: Sed Rate: 95 mm/hr — ABNORMAL HIGH (ref 0–16)

## 2019-12-24 LAB — VITAMIN B12: Vitamin B-12: 159 pg/mL — ABNORMAL LOW (ref 180–914)

## 2019-12-24 LAB — SURGICAL PCR SCREEN
MRSA, PCR: POSITIVE — AB
Staphylococcus aureus: POSITIVE — AB

## 2019-12-24 LAB — GLUCOSE, CAPILLARY: Glucose-Capillary: 89 mg/dL (ref 70–99)

## 2019-12-24 LAB — PHOSPHORUS: Phosphorus: 3.2 mg/dL (ref 2.5–4.6)

## 2019-12-24 LAB — ABO/RH: ABO/RH(D): O POS

## 2019-12-24 SURGERY — AMPUTATION, ABOVE KNEE
Anesthesia: General | Site: Leg Upper | Laterality: Left

## 2019-12-24 MED ORDER — PROPOFOL 10 MG/ML IV BOLUS
INTRAVENOUS | Status: AC
  Filled 2019-12-24: qty 20

## 2019-12-24 MED ORDER — LIDOCAINE HCL (CARDIAC) PF 100 MG/5ML IV SOSY
PREFILLED_SYRINGE | INTRAVENOUS | Status: DC | PRN
  Administered 2019-12-24: 100 mg via INTRAVENOUS

## 2019-12-24 MED ORDER — FENTANYL CITRATE (PF) 250 MCG/5ML IJ SOLN
INTRAMUSCULAR | Status: AC
  Filled 2019-12-24: qty 5

## 2019-12-24 MED ORDER — MIDAZOLAM HCL 2 MG/2ML IJ SOLN
INTRAMUSCULAR | Status: AC
  Filled 2019-12-24: qty 2

## 2019-12-24 MED ORDER — LACTATED RINGERS IV SOLN: INTRAVENOUS | Status: DC

## 2019-12-24 MED ORDER — PROPOFOL 10 MG/ML IV BOLUS
INTRAVENOUS | Status: DC | PRN
  Administered 2019-12-24: 150 mg via INTRAVENOUS

## 2019-12-24 MED ORDER — ENOXAPARIN SODIUM 40 MG/0.4ML ~~LOC~~ SOLN
40.0000 mg | SUBCUTANEOUS | Status: DC
  Administered 2019-12-24 – 2019-12-27 (×4): 40 mg via SUBCUTANEOUS
  Filled 2019-12-24 (×4): qty 0.4

## 2019-12-24 MED ORDER — CYANOCOBALAMIN 1000 MCG/ML IJ SOLN
1000.0000 ug | Freq: Once | INTRAMUSCULAR | Status: AC
  Administered 2019-12-24: 1000 ug via SUBCUTANEOUS
  Filled 2019-12-24: qty 1

## 2019-12-24 MED ORDER — PHENYLEPHRINE HCL (PRESSORS) 10 MG/ML IV SOLN
INTRAVENOUS | Status: DC | PRN
  Administered 2019-12-24 (×4): 80 ug via INTRAVENOUS

## 2019-12-24 MED ORDER — ONDANSETRON HCL 4 MG/2ML IJ SOLN
INTRAMUSCULAR | Status: DC | PRN
  Administered 2019-12-24: 4 mg via INTRAVENOUS

## 2019-12-24 MED ORDER — FENTANYL CITRATE (PF) 250 MCG/5ML IJ SOLN
INTRAMUSCULAR | Status: DC | PRN
  Administered 2019-12-24 (×3): 50 ug via INTRAVENOUS

## 2019-12-24 MED ORDER — SODIUM CHLORIDE 0.9 % IV SOLN
2.0000 g | Freq: Three times a day (TID) | INTRAVENOUS | Status: DC
  Administered 2019-12-24 – 2019-12-26 (×7): 2 g via INTRAVENOUS
  Filled 2019-12-24 (×7): qty 2

## 2019-12-24 MED ORDER — OXYCODONE-ACETAMINOPHEN 5-325 MG PO TABS
1.0000 | ORAL_TABLET | ORAL | Status: DC | PRN
  Administered 2019-12-24 – 2019-12-25 (×2): 2 via ORAL
  Administered 2019-12-25: 1 via ORAL
  Administered 2019-12-25: 2 via ORAL
  Administered 2019-12-25: 1 via ORAL
  Administered 2019-12-26 (×2): 2 via ORAL
  Administered 2019-12-26 – 2019-12-27 (×3): 1 via ORAL
  Administered 2019-12-28: 21:00:00 2 via ORAL
  Administered 2019-12-28 – 2019-12-29 (×2): 1 via ORAL
  Administered 2019-12-29 – 2019-12-30 (×3): 2 via ORAL
  Filled 2019-12-24 (×7): qty 2
  Filled 2019-12-24 (×3): qty 1
  Filled 2019-12-24: qty 2
  Filled 2019-12-24: qty 1
  Filled 2019-12-24: qty 2
  Filled 2019-12-24 (×3): qty 1
  Filled 2019-12-24: qty 2

## 2019-12-24 MED ORDER — PHENYLEPHRINE HCL-NACL 10-0.9 MG/250ML-% IV SOLN
INTRAVENOUS | Status: DC | PRN
  Administered 2019-12-24: 25 ug/min via INTRAVENOUS

## 2019-12-24 MED ORDER — ONDANSETRON HCL 4 MG/2ML IJ SOLN: 4.0000 mg | Freq: Once | INTRAMUSCULAR | Status: DC | PRN

## 2019-12-24 MED ORDER — HYDROMORPHONE HCL 1 MG/ML IJ SOLN: 0.2500 mg | INTRAMUSCULAR | Status: DC | PRN

## 2019-12-24 MED ORDER — MUPIROCIN 2 % EX OINT
1.0000 "application " | TOPICAL_OINTMENT | Freq: Two times a day (BID) | CUTANEOUS | Status: AC
  Administered 2019-12-24 – 2019-12-28 (×10): 1 via TOPICAL
  Filled 2019-12-24 (×6): qty 22

## 2019-12-24 MED ORDER — 0.9 % SODIUM CHLORIDE (POUR BTL) OPTIME
TOPICAL | Status: DC | PRN
  Administered 2019-12-24: 1000 mL

## 2019-12-24 MED ORDER — MEPERIDINE HCL 25 MG/ML IJ SOLN: 6.2500 mg | INTRAMUSCULAR | Status: DC | PRN

## 2019-12-24 SURGICAL SUPPLY — 47 items
BANDAGE ESMARK 6X9 LF (GAUZE/BANDAGES/DRESSINGS) IMPLANT
BLADE SAW GIGLI 510 (BLADE) ×2 IMPLANT
BLADE SAW GIGLI 510MM (BLADE) ×1
BNDG CMPR 9X6 STRL LF SNTH (GAUZE/BANDAGES/DRESSINGS)
BNDG CMPR MED 10X6 ELC LF (GAUZE/BANDAGES/DRESSINGS) ×1
BNDG ELASTIC 4X5.8 VLCR STR LF (GAUZE/BANDAGES/DRESSINGS) ×3 IMPLANT
BNDG ELASTIC 6X10 VLCR STRL LF (GAUZE/BANDAGES/DRESSINGS) ×2 IMPLANT
BNDG ELASTIC 6X5.8 VLCR STR LF (GAUZE/BANDAGES/DRESSINGS) ×3 IMPLANT
BNDG ESMARK 6X9 LF (GAUZE/BANDAGES/DRESSINGS)
BNDG GAUZE ELAST 4 BULKY (GAUZE/BANDAGES/DRESSINGS) ×2 IMPLANT
CANISTER SUCT 3000ML PPV (MISCELLANEOUS) ×3 IMPLANT
CLIP LIGATING EXTRA MED SLVR (CLIP) ×3 IMPLANT
CLIP LIGATING EXTRA SM BLUE (MISCELLANEOUS) ×3 IMPLANT
COVER SURGICAL LIGHT HANDLE (MISCELLANEOUS) ×6 IMPLANT
COVER WAND RF STERILE (DRAPES) ×3 IMPLANT
CUFF TOURN SGL QUICK 34 (TOURNIQUET CUFF)
CUFF TOURN SGL QUICK 42 (TOURNIQUET CUFF) IMPLANT
CUFF TRNQT CYL 34X4.125X (TOURNIQUET CUFF) IMPLANT
DRAIN SNY 10X20 3/4 PERF (WOUND CARE) IMPLANT
DRAPE HALF SHEET 40X57 (DRAPES) ×3 IMPLANT
DRAPE ORTHO SPLIT 77X108 STRL (DRAPES) ×6
DRAPE SURG ORHT 6 SPLT 77X108 (DRAPES) ×2 IMPLANT
ELECT CAUTERY BLADE 6.4 (BLADE) ×3 IMPLANT
ELECT REM PT RETURN 9FT ADLT (ELECTROSURGICAL) ×3
ELECTRODE REM PT RTRN 9FT ADLT (ELECTROSURGICAL) ×1 IMPLANT
EVACUATOR SILICONE 100CC (DRAIN) IMPLANT
GAUZE SPONGE 4X4 12PLY STRL (GAUZE/BANDAGES/DRESSINGS) ×4 IMPLANT
GAUZE XEROFORM 5X9 LF (GAUZE/BANDAGES/DRESSINGS) ×3 IMPLANT
GLOVE SS BIOGEL STRL SZ 7.5 (GLOVE) ×1 IMPLANT
GLOVE SUPERSENSE BIOGEL SZ 7.5 (GLOVE) ×2
GOWN STRL REUS W/ TWL LRG LVL3 (GOWN DISPOSABLE) ×3 IMPLANT
GOWN STRL REUS W/TWL LRG LVL3 (GOWN DISPOSABLE) ×9
KIT BASIN OR (CUSTOM PROCEDURE TRAY) ×3 IMPLANT
KIT TURNOVER KIT B (KITS) ×3 IMPLANT
NS IRRIG 1000ML POUR BTL (IV SOLUTION) ×3 IMPLANT
PACK GENERAL/GYN (CUSTOM PROCEDURE TRAY) ×3 IMPLANT
PAD ARMBOARD 7.5X6 YLW CONV (MISCELLANEOUS) ×6 IMPLANT
PADDING CAST COTTON 6X4 STRL (CAST SUPPLIES) IMPLANT
STAPLER VISISTAT 35W (STAPLE) ×5 IMPLANT
STOCKINETTE IMPERVIOUS LG (DRAPES) ×3 IMPLANT
SUT ETHILON 3 0 PS 1 (SUTURE) IMPLANT
SUT VIC AB 0 CT1 18XCR BRD 8 (SUTURE) ×2 IMPLANT
SUT VIC AB 0 CT1 8-18 (SUTURE) ×6
SUT VICRYL AB 2 0 TIES (SUTURE) ×5 IMPLANT
TOWEL GREEN STERILE (TOWEL DISPOSABLE) ×6 IMPLANT
UNDERPAD 30X30 (UNDERPADS AND DIAPERS) ×3 IMPLANT
WATER STERILE IRR 1000ML POUR (IV SOLUTION) ×3 IMPLANT

## 2019-12-24 NOTE — Op Note (Signed)
    OPERATIVE REPORT  DATE OF SURGERY: 12/24/2019  PATIENT: Terrence Carr, 58 y.o. male MRN: [id number]  DOB: 1961/10/26  PRE-OPERATIVE DIAGNOSIS: Gangrene left foot  POST-OPERATIVE DIAGNOSIS:  Same  PROCEDURE: Left above-knee amputation  SURGEON:  Curt Jews, M.D.  PHYSICIAN ASSISTANT: Risa Grill PA-C  ANESTHESIA: General  EBL: per anesthesia record  Total I/O In: 500 [I.V.:500] Out: 50 [Blood:50]  BLOOD ADMINISTERED: none  DRAINS: none  SPECIMEN: none  COUNTS CORRECT:  YES  PATIENT DISPOSITION:  PACU - hemodynamically stable  PROCEDURE DETAILS: Patient was taken the operating placed to position with area the left leg was prepped draped you sterile fashion.  [place] type incision was made at the base of the distal thigh and carried down through the fat and fascia and muscle to the level of the femur.  Superficial femoral artery was chronically occluded and was ligated and divided.  The femoral vein was ligated and divided.  The sciatic nerve was ligated further proximally and divided.  The periosteum was elevated off the femur and the femur was divided with a Gigli saw.  Bone edges were smoothed with a bone rasp.  The wound was irrigated with saline and hemostasis was obtained with electrocautery.  The anterior fascia was closed to the posterior fascia with interrupted 0 Vicryl figure-of-eight sutures.  The skin with closed with skin staples.  Sterile dressing was applied and the patient was transferred to the recovery in stable edition   Rosetta Posner, M.D., [facility] 12/24/2019 11:51 AM

## 2019-12-24 NOTE — Transfer of Care (Signed)
Immediate Anesthesia Transfer of Care Note  Patient: Terrence Carr  Procedure(s) Performed: LEFT ABOVE KNEE AMPUTATION (Left Leg Upper)  Patient Location: PACU  Anesthesia Type:General  Level of Consciousness: awake, alert  and oriented  Airway & Oxygen Therapy: Patient Spontanous Breathing and Patient connected to nasal cannula oxygen  Post-op Assessment: Report given to RN and Post -op Vital signs reviewed and stable  Post vital signs: Reviewed and stable  Last Vitals:  Vitals Value Taken Time  BP 127/66 12/24/19 1154  Temp    Pulse 67 12/24/19 1156  Resp 13 12/24/19 1156  SpO2 100 % 12/24/19 1156  Vitals shown include unvalidated device data.  Last Pain:  Vitals:   12/24/19 0841  TempSrc: Oral  PainSc:          Complications: No apparent anesthesia complications

## 2019-12-24 NOTE — Progress Notes (Signed)
Pharmacy Antibiotic Note  Terrence Carr is a 58 y.o. male admitted on 12/23/2019 with osteomyelitis.  Pharmacy has now been consulted for cefepime dosing. Patient did receive cefepime 2 g at 2053 in the ED and was also started on vancomycin.  Presenting with multiple wounds to bilateral extremities with noted foul odor and drainage. WBC 7.1, afebrile. Scr 0.62 (CrCl 88 mL/min).   Plan: Start Cefepime 2 g IV every 8 hours Continue vancomycin 1 g IV every 12 hours (estAUC 476)  Monitor renal function, culture results, clinical impression, and vancomycin levels as appropriate  Height: 5\' 6"  (167.6 cm) Weight: 165 lb (74.8 kg) IBW/kg (Calculated) : 63.8  Temp (24hrs), Avg:98.9 F (37.2 C), Min:97.9 F (36.6 C), Max:99.9 F (37.7 C)  Recent Labs  Lab 12/23/19 1854 12/23/19 2124  WBC 7.1  --   CREATININE 0.62  --   LATICACIDVEN 5.4* 2.1*    Estimated Creatinine Clearance: 88.6 mL/min (by C-G formula based on SCr of 0.62 mg/dL).     Antimicrobials this admission: Vancomycin 12/23 >>  Cefepime 12/23 >>  Dose adjustments this admission: N/A  Microbiology results: N/A   Thank you for allowing [place] to participate in this patients care. Jens Som, PharmD  Please check AMION for all [facility] phone numbers 12/24/2019 12:17 AM

## 2019-12-24 NOTE — Anesthesia Procedure Notes (Signed)
Procedure Name: LMA Insertion Date/Time: 12/24/2019 10:53 AM Performed by: Mariea Clonts, CRNA Pre-anesthesia Checklist: Patient identified, Emergency Drugs available, Suction available and Patient being monitored Patient Re-evaluated:Patient Re-evaluated prior to induction Oxygen Delivery Method: Circle System Utilized Preoxygenation: Pre-oxygenation with 100% oxygen Induction Type: IV induction Ventilation: Mask ventilation without difficulty LMA: LMA inserted LMA Size: 4.0 Number of attempts: 1 Airway Equipment and Method: Bite block Placement Confirmation: positive ETCO2 Tube secured with: Tape Dental Injury: Teeth and Oropharynx as per pre-operative assessment

## 2019-12-24 NOTE — Progress Notes (Signed)
[facility] Progress Note  Patient: Terrence Carr [id number]   PCP: Patient, No Pcp Per DOB: 1961/05/20   DOA: 12/23/2019   DOS: 12/24/2019   Date of Service: the patient was seen and examined on 12/24/2019  Chief Complaint  Patient presents with   Wound Check   Brief hospital course: Pt. with PMH of HTN, PAD, active smoker, alcohol abuse, peripheral neuropathy, B12 deficiency, severe protein calorie malnutrition; presented with complain of bilateral leg wound and pain progressively worsening over last 3 weeks, was found to have extensive gangrene of left foot.  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  Currently further plan is postoperative recovery.  Subjective: Denies any acute complaint.  No nausea no vomiting.  No fever no chills.  Somewhat confused.  Assessment and Plan: Scheduled Meds:  LORazepam  0-4 mg Intravenous Q6H   Or   LORazepam  0-4 mg Oral Q6H   [START ON 12/26/2019] LORazepam  0-4 mg Intravenous Q12H   Or   [START ON 12/26/2019] LORazepam  0-4 mg Oral Q12H   mupirocin ointment  1 application Topical BID   thiamine  100 mg Oral Daily   Or   thiamine  100 mg Intravenous Daily   Continuous Infusions:  ceFEPime (MAXIPIME) IV 2 g (12/24/19 1351)   vancomycin     PRN Meds: acetaminophen **OR** acetaminophen, oxyCODONE-acetaminophen  1.  Peripheral vascular disease Critical limb ischemia bilaterally Left foot gangrene. Osteomyelitis SP left AKA Appreciate vascular surgery assistance. Patient underwent left AKA. Tolerated procedure very well. Suspecting that the patient will also require right leg amputation but currently not urgent at this time.. Patient had significant lactic acidosis at the time of admission requiring IV hydration suspect that is from hypoperfusion. Given his presentation, will continue with the antibiotics for now and treat for at least 7 days.  2.  Alcohol abuse Alcohol withdrawal Continue CIWA  protocol. Continue thiamine.  3.  Macrocytic anemia Severe B12 deficiency. B12 injection subcutaneously x1 followed by oral B12 daily. Also continue oral folic acid.  4.  Reactive thrombocytosis. Platelet count significantly elevated at the time of the admission. 533, down to 467. Monitor.  5.  Severe protein calorie malnutrition. Likely from alcohol abuse. Dietary consultation. At risk for poor wound healing. Also at risk for third spacing from hypoalbuminemia. Monitor.  6.  Left buttock pressure ulcer stage II POA Continue foam dressing.  Pressure Injury 12/23/19 Buttocks Left;Right;Posterior;Lower;Upper;Mid Stage 2 -  Partial thickness loss of dermis presenting as a shallow open injury with a red, pink wound bed without slough. multiple pressure injuries to buttocks.  (Active)  12/23/19 2102  Location: Buttocks  Location Orientation: Left;Right;Posterior;Lower;Upper;Mid  Staging: Stage 2 -  Partial thickness loss of dermis presenting as a shallow open injury with a red, pink wound bed without slough.  Wound Description (Comments): multiple pressure injuries to buttocks.   Present on Admission: Yes   Diet: Cardiac diet and Regular diet  DVT Prophylaxis: Subcutaneous Lovenox   Advance goals of care discussion: Full code  Family Communication: no family was present at bedside, at the time of interview.  Disposition:  Discharge to be determined.  Consultants: Vascular surgery Procedures: Left AKA  Antibiotics: Anti-infectives (From admission, onward)   Start     Dose/Rate Route Frequency Ordered Stop   12/24/19 0800  vancomycin (VANCOCIN) IVPB 1000 mg/200 mL premix     1,000 mg 200 mL/hr over 60 Minutes Intravenous Every 12 hours 12/23/19 1950     12/24/19  0500  ceFEPIme (MAXIPIME) 2 g in sodium chloride 0.9 % 100 mL IVPB     2 g 200 mL/hr over 30 Minutes Intravenous Every 8 hours 12/24/19 0017     12/23/19 2045  ceFEPIme (MAXIPIME) 2 g in sodium chloride 0.9 % 100  mL IVPB     2 g 200 mL/hr over 30 Minutes Intravenous  Once 12/23/19 2036 12/23/19 2148   12/23/19 1930  ceFAZolin (ANCEF) IVPB 1 g/50 mL premix  Status:  Discontinued     2 g 200 mL/hr over 30 Minutes Intravenous  Once 12/23/19 1918 12/23/19 2036   12/23/19 1900  vancomycin (VANCOREADY) IVPB 1500 mg/300 mL     1,500 mg 150 mL/hr over 120 Minutes Intravenous  Once 12/23/19 1857 12/23/19 2231       Objective: Physical Exam: Vitals:   12/24/19 1225 12/24/19 1240 12/24/19 1251 12/24/19 1559  BP: 139/72 126/66 126/64   Pulse: 81 72 72 76  Resp: 17 14 15    Temp:  97.6 F (36.4 C) 98.4 F (36.9 C)   TempSrc:      SpO2: 100% 100% 100%   Weight:      Height:        Intake/Output Summary (Last 24 hours) at 12/24/2019 1610 Last data filed at 12/24/2019 1145 Gross per 24 hour  Intake 1911.52 ml  Output 250 ml  Net 1661.52 ml   Filed Weights   12/23/19 1855 12/24/19 1008  Weight: 74.8 kg 74.8 kg   General: alert and oriented to time, place, and person. Appear in moderate distress, affect appropriate Eyes: PERRL, Conjunctiva normal ENT: Oral Mucosa Clear, moist  Neck: no JVD, no Abnormal Mass Or lumps Cardiovascular: S1 and S2 Present, no Murmur,  Respiratory: good respiratory effort, Bilateral Air entry equal and Decreased, no signs of accessory muscle use, Clear to Auscultation, no Crackles, no wheezes Abdomen: Bowel Sound present, Soft and no tenderness, no hernia Skin: no rashes  Extremities: no Pedal edema, no calf tenderness Neurologic: without any new focal findings no asterixis Gait not checked due to patient safety concerns  Data Reviewed: I have personally reviewed and interpreted daily labs, tele strips, imagings as discussed above. I reviewed all nursing notes, pharmacy notes, vitals, pertinent old records I have discussed plan of care as described above with RN and patient/family.  CBC: Recent Labs  Lab 12/23/19 1854 12/24/19 0753  WBC 7.1 7.1  NEUTROABS  5.3 5.9  HGB 9.7* 8.3*  HCT 29.8* 24.5*  MCV 101.4* 97.2  PLT 533* 0000000*   Basic Metabolic Panel: Recent Labs  Lab 12/23/19 1854 12/24/19 0419 12/24/19 0753  NA 132*  --  130*  K 3.7  --  3.9  CL 95*  --  97*  CO2 16*  --  22  GLUCOSE 59*  --  100*  BUN 8  --  8  CREATININE 0.62  --  0.71  CALCIUM 8.5*  --  7.9*  MG  --  1.9 2.0  PHOS  --  3.2  --     Liver Function Tests: Recent Labs  Lab 12/24/19 0753  AST 49*  ALT 21  ALKPHOS 189*  BILITOT 1.0  PROT 5.3*  ALBUMIN 1.6*   No results for input(s): LIPASE, AMYLASE in the last 168 hours. No results for input(s): AMMONIA in the last 168 hours. Coagulation Profile: No results for input(s): INR, PROTIME in the last 168 hours. Cardiac Enzymes: No results for input(s): CKTOTAL, CKMB, CKMBINDEX, TROPONINI in the last  168 hours. BNP (last 3 results) No results for input(s): PROBNP in the last 8760 hours. CBG: Recent Labs  Lab 12/23/19 2132 12/23/19 2312 12/24/19 0640  GLUCAP 62* 128* 89   Studies: DG Tibia/Fibula Left  Result Date: 12/23/2019 CLINICAL DATA:  Multiple wounds. Assessment for osteomyelitis. EXAM: LEFT TIBIA AND FIBULA - 2 VIEW COMPARISON:  None FINDINGS: Diffuse mild soft tissue swelling of the left lower leg. Question some subcortical lucency along the distal fibular metadiaphysis with overlying material. If there is visible ulceration at this location, could reflect early features of osteomyelitis. No other erosion, destructive change, or immature periostitis to suggest a discrete focus of infection. Few fragments in the suprapatellar joint recess of the left knee, may be posttraumatic or degenerative in nature. Serpiginous sclerosis in the distal femur likely sequela of prior infarct. Atherosclerotic calcifications noted in the posterior knee. IMPRESSION: 1. Question subtle subcortical lucency along the distal fibular metadiaphysis with overlying bandage material. If there is visible ulceration at this  location, could reflect early features of osteomyelitis. 2. No other erosion, destructive change, or immature periostitis to suggest a discrete focus of infection. Electronically Signed   By: Lovena Le M.D.   On: 12/23/2019 18:59   DG Tibia/Fibula Right  Result Date: 12/23/2019 CLINICAL DATA:  Wounds, assess for ostia EXAM: RIGHT TIBIA AND FIBULA - 2 VIEW COMPARISON:  Concurrent ankle radiographs FINDINGS: Diffuse edematous thickening of the right lower extremity which is increasing towards the level of the foot. There is circumferential swelling at the level of the ankle with a large effusion and some questionable gas suspicious lucency noted in the subcortical bone of the distal tibial metaphysis. Mild tricompartmental degenerative changes are seen at the knee. Vascular calcium in the soft tissues. No acute fracture or traumatic malalignment. IMPRESSION: 1. Diffuse edematous thickening of the right lower extremity which is increasing towards the level of the foot. 2. There is a large ankle effusion, questionable gas in the joint, and circumferential swelling at the level of the ankle. 3. Subcortical lucency at the distal tibial metaphysis, worrisome for osteomyelitis. Electronically Signed   By: Lovena Le M.D.   On: 12/23/2019 19:09   DG Foot Complete Left  Result Date: 12/23/2019 CLINICAL DATA:  Wounds, assess for osteomyelitis EXAM: LEFT FOOT - COMPLETE 3+ VIEW COMPARISON:  None. FINDINGS: Diffuse soft tissue swelling of the foot. There is ulceration noted along the plantar aspect of the heel and posterolateral aspect of the foot. No subjacent osseous erosion, cortical lucency or immature periostitis to suggest site of osteomyelitis. No other radiographically evident site of ulceration. Vascular calcium noted in the soft tissues. Diffuse degenerative changes in the midfoot are noted most pronounced involving navicular, medial cuneiform and base of the first metatarsal. Clawtoe deformities of the  second through fifth digits. IMPRESSION: 1. Soft tissue ulceration along the plantar aspect of the heel and posterolateral aspect of the foot. 2. No convincing radiographic evidence for osteomyelitis. If there is persisting concern contrast-enhanced MRI is more sensitive and specific. 3. Diffuse soft tissue swelling of the foot. 4. Diffuse degenerative changes in the midfoot. Electronically Signed   By: Lovena Le M.D.   On: 12/23/2019 19:02   DG Foot Complete Right  Result Date: 12/23/2019 CLINICAL DATA:  Wounds, assess for osteomyelitis. EXAM: RIGHT FOOT COMPLETE - 3+ VIEW COMPARISON:  None. FINDINGS: There is diffuse soft tissue swelling and ulceration of the foot at the level of the plantar calcaneus and along the medial aspect of the  head of the first metatarsal. There is some transcortical lucency at the base of the first distal phalanx. The osseous structures appear diffusely demineralized which may limit detection of small or nondisplaced fractures or subtle osseous features of osteomyelitis. There is questionable intra-articular gas at the level of the ankle. There is a severe hallux valgus deformity of the first ray with marked clawtoe deformities of the second through fifth digits. Extensive degenerative change noted through the mid and hindfoot as well as at the level of the ankle. No acute fracture or malalignment is seen. Ankle effusion is noted. IMPRESSION: 1. Diffuse soft tissue swelling and focal ulceration of the foot at the level of the plantar calcaneus and along the medial aspect of the head of the first metatarsal. 2. There is some transcortical lucency at the base of the first distal phalanx which could be secondary to osteomyelitis. 3. Question some intra-articular gas at the level of the ankle. Consider cross-sectional imaging. 4. Diffuse bony demineralization and degenerative changes, as above. Electronically Signed   By: Lovena Le M.D.   On: 12/23/2019 19:07     Time spent: 35  minutes  Author: Berle Mull, MD [facility] 12/24/2019 4:10 PM  To reach On-call, see care teams to locate the attending and reach out to them via [email or web address]. If 7PM-7AM, please contact night-coverage If you still have difficulty reaching the attending provider, please page the [facility] (Director on Call) for [facility] on amion for assistance.

## 2019-12-24 NOTE — Plan of Care (Signed)

## 2019-12-24 NOTE — Consult Note (Addendum)
Hospital Consult    Reason for Consult:  PAD Requesting Physician:  Nils Flack MRN #:  [id number]  History of Present Illness: This is a 58 y.o. male who has a hx of PAD.  He was seen by his vascular surgery office 09/02/2019: status post right femoral endarterectomy and right iliac artery stent placement by Dr. Radene Knee in March 2020. This was approximately 6 months ago. He states that today he is still feeling well but he is having persistent issues with tingling and sharp tinges of pain into the bilateral bottoms of his feet. He states that this is been an ongoing issue for him. It does keep him up at night requiring him to take a large amount of Goody powder. He denies taking an aspirin daily. But as mentioned he has finished 2 packs of Goody powders in a short period of time per his grandson who accompanies him at today's visit.  When further questioned about claudication while he is up walking around he states that he does not ambulate often. He states that he does get up to perform his ADLs and back and forth to the bathroom without issue. He states that both of his feet bother him equally. He denies any wounds. He does state that he is still smoking several cigarettes daily. He seems to be taking Plavix. He is also on multiple antihypertensives. He is on 300 mg of Neurontin but states that he only takes this twice a day. I did trying question about other medications with him but he did seem unsure about all of the medications that he is currently taking.    They discussed smoking cessation and started neurontin for his neuropathic pain.  [facility] with c/o acute onset and progressive worsening of wound to BLE for 3 weeks.  The left is worse than the right.  He has been soaking his wounds in Epsom salts.    He had xrays that were concerning for osteomyelitis at the base of the first distal phalanx of the right foot as well as left distal fibular metadiaphysis and he was started on  IV abx.  He was then transferred to [facility] for further evaluation.  His covid test is negative.    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   The pt is not on a statin for cholesterol management.  The pt is not on a daily aspirin.   Other AC:  Plavix The pt is on CCB, ACEI for hypertension.   The pt is not diabetic.   Tobacco hx:  current  PMH: PAD HTN Tobacco use  PSH:  See HPI as well as left knee surgery and left shoulder surgery     Prior to Admission medications   Medication Sig Start Date End Date Taking? Authorizing Provider  amLODipine (NORVASC) 5 MG tablet Take 5 mg by mouth daily. 09/28/19  Yes [provider]  buPROPion (WELLBUTRIN SR) 100 MG 12 hr tablet Take 100 mg by mouth 2 (two) times daily. 09/28/19  Yes [provider]  chlorthalidone (HYGROTON) 25 MG tablet Take 25 mg by mouth every morning. 09/28/19  Yes [provider]  clopidogrel (PLAVIX) 75 MG tablet Take 75 mg by mouth daily. 09/28/19  Yes [provider]  cyanocobalamin 1000 MCG tablet Take by mouth. 09/11/19  Yes [provider]  ferrous sulfate 325 (65 FE) MG tablet Take 325 mg by mouth daily. 09/11/19  Yes [provider]  folic acid (FOLVITE) 1 MG  tablet Take 1 mg by mouth daily. 09/11/19  Yes [provider]  gabapentin (NEURONTIN) 300 MG capsule Take 600 mg by mouth 3 (three) times daily. 09/02/19  Yes [provider]  lisinopril (ZESTRIL) 20 MG tablet Take 20 mg by mouth daily. 09/28/19  Yes [provider]  metoprolol tartrate (LOPRESSOR) 25 MG tablet Take 25 mg by mouth 2 (two) times daily. 09/28/19  Yes [provider]  Multiple Vitamins-Minerals (THERA-M) TABS Take 1 tablet by mouth daily. 09/11/19  Yes [provider]  naltrexone (DEPADE) 50 MG tablet Take 50 mg by mouth daily. 09/11/19  Yes [provider]  nicotine (NICODERM CQ - DOSED IN MG/24 HOURS) 14 mg/24hr patch 14 mg daily.  09/11/19  Yes [provider]  nitroGLYCERIN (NITROSTAT) 0.4 MG SL tablet Place under the tongue. 03/13/19  Yes [provider]  thiamine 100 MG tablet Take by mouth. 09/11/19  Yes [provider]    Social History   Socioeconomic History  . Marital status: Single    Spouse name: Not on file  . Number of children: Not on file  . Years of education: Not on file  . Highest education level: Not on file  Occupational History  . Not on file  Tobacco Use  . Smoking status: Never Smoker  . Smokeless tobacco: Never Used  Substance and Sexual Activity  . Alcohol use: Never  . Drug use: Never  . Sexual activity: Not on file  Other Topics Concern  . Not on file  Social History Narrative  . Not on file   Social Determinants of Health   Financial Resource Strain:   . Difficulty of Paying Living Expenses: Not on file  Food Insecurity:   . Worried About Charity fundraiser in the Last Year: Not on file  . Ran Out of Food in the Last Year: Not on file  Transportation Needs:   . Lack of Transportation (Medical): Not on file  . Lack of Transportation (Non-Medical): Not on file  Physical Activity:   . Days of Exercise per Week: Not on file  . Minutes of Exercise per Session: Not on file  Stress:   . Feeling of Stress : Not on file  Social Connections:   . Frequency of Communication with Friends and Family: Not on file  . Frequency of Social Gatherings with Friends and Family: Not on file  . Attends Religious Services: Not on file  . Active Member of Clubs or Organizations: Not on file  . Attends Archivist Meetings: Not on file  . Marital Status: Not on file  Intimate Partner Violence:   . Fear of Current or Ex-Partner: Not on file  . Emotionally Abused: Not on file  . Physically Abused: Not on file  . Sexually Abused: Not on file    FH: Fother:  Cancer-deceased Mother:  Heart dz-deceased  ROS: [x]  Positive   [ ]  Negative   [ ]  All sytems  reviewed and are negative  Cardiac: [x]  high blood pressure  Vascular: [x]  pain in legs while walking [x]  pain in legs at rest [x]  pain in legs at night [x]  non-healing ulcers   Pulmonary: []  productive cough []  asthma/wheezing []  home O2  Neurologic: []  hx of CVA []  mini stroke  Hematologic: []  hx of cancer  Endocrine:   []  diabetes []  thyroid disease   GU: []  CKD/renal failure []  HD--[]  M/W/F or []  T/T/S  Psychiatric: []  anxiety []  depression  Musculoskeletal: []   arthritis []  joint pain  Integumentary: []  rashes []  ulcers  Constitutional: []  fever []  chills   Physical Examination  Vitals:   12/24/19 0100 12/24/19 0309  BP: (!) 158/77 138/70  Pulse: 98 87  Resp: 17 17  Temp: 98.4 F (36.9 C) 98.9 F (37.2 C)  SpO2: 100% 100%   Body mass index is 26.63 kg/m.  General:  Thin male is mild pain Gait: Not observed HENT: WNL, normocephalic Pulmonary: normal non-labored breathing, without Rales, rhonchi,  wheezing Cardiac: regular Abdomen:  soft, NT/ND, no masses Skin: with rash on right thigh and medial left thigh and right arm Vascular Exam/Pulses:  Right Left  Radial 2+ (normal) 2+ (normal)  Femoral 2+ (normal) 1+ (weak)  Unable to palpate distal pulses  Extremities:         Musculoskeletal: no muscle wasting or atrophy  Neurologic: A&O X 3;  No focal weakness or paresthesias are detected; speech is fluent/normal   CBC    Component Value Date/Time   WBC 7.1 12/23/2019 1854   RBC 2.94 (L) 12/23/2019 1854   HGB 9.7 (L) 12/23/2019 1854   HCT 29.8 (L) 12/23/2019 1854   PLT 533 (H) 12/23/2019 1854   MCV 101.4 (H) 12/23/2019 1854   MCH 33.0 12/23/2019 1854   MCHC 32.6 12/23/2019 1854   RDW 17.6 (H) 12/23/2019 1854   LYMPHSABS 1.2 12/23/2019 1854   MONOABS 0.6 12/23/2019 1854   EOSABS 0.0 12/23/2019 1854   BASOSABS 0.1 12/23/2019 1854    BMET    Component Value Date/Time   NA 132 (L) 12/23/2019 1854   K 3.7 12/23/2019  1854   CL 95 (L) 12/23/2019 1854   CO2 16 (L) 12/23/2019 1854   GLUCOSE 59 (L) 12/23/2019 1854   BUN 8 12/23/2019 1854   CREATININE 0.62 12/23/2019 1854   CALCIUM 8.5 (L) 12/23/2019 1854   GFRNONAA >60 12/23/2019 1854   GFRAA >60 12/23/2019 1854    COAGS: No results found for: INR, PROTIME   Non-Invasive Vascular Imaging:   Left foot xray 12/23/2019: IMPRESSION: 1. Soft tissue ulceration along the plantar aspect of the heel and posterolateral aspect of the foot. 2. No convincing radiographic evidence for osteomyelitis. If there is persisting concern contrast-enhanced MRI is more sensitive and specific. 3. Diffuse soft tissue swelling of the foot. 4. Diffuse degenerative changes in the midfoot  Right foot xray 12/23/2019: IMPRESSION: 1. Diffuse soft tissue swelling and focal ulceration of the foot at the level of the plantar calcaneus and along the medial aspect of the head of the first metatarsal. 2. There is some transcortical lucency at the base of the first distal phalanx which could be secondary to osteomyelitis. 3. Question some intra-articular gas at the level of the ankle. Consider cross-sectional imaging. 4. Diffuse bony demineralization and degenerative changes, as above.  CTA 03/09/2019:  1. Occlusion of the right common iliac artery extending into the right external iliac artery, common femoral artery, and superficial femoral artery with reconstitution at the above-knee popliteal artery. There is faint contrast opacification distally of the right anterior tibial, posterior tibial, and peroneal arteries to the level of the ankle. 2. Occlusion of the left SFA at its proximal one third with reconstitution distally at the above-knee popliteal artery with three-vessel runoff. 3. Approximately 5.5 cm fluid and gas containing collection anterior to the bladder. In the absence of infectious symptoms, this may reflect a urachal remnant or bladder diverticulum.  Alternatively, this could represent a diverticular abscess or infected urachal remnant  if there are infectious symptoms.  TTE 03/09/2019: SUMMARY The left ventricular size is normal.  Left ventricular systolic function is normal. LV ejection fraction = 60-65%. The right ventricle is normal in size and function. No significant stenosis or regurgitation seen There was insufficient TR detected to calculate RV systolic pressure. Estimated right atrial pressure is 5 mmHg.Marland Kitchen There is no pericardial effusion. Unable to compare with prior study in 2011  ASSESSMENT/PLAN: This is a 58 y.o. male with hx of PAD and hx of right common femoral and profunda endarterectomy and right iliac artery stent by Dr. Radene Knee March 2020  -unable to palpate distal pulses-femoral pulses are palpable with right>left -extensive wounds LLE and most likely will require AKA -he does have wounds on the RLE but not as extensive as the left. -he also has a rash on right thigh, medial left thigh and right arm-  Allergic rxn to abx? -Dr. Donnetta Hutching will be by to see pt this morning.   Leontine Locket, PA-C [facility] [phone]  I have examined the patient, reviewed and agree with above.  Difficult management issue.  Patient has very poor health care.  Had revascularization with right femoral endarterectomy in March 2020 at another institution.  Last follow-up available following that revealed ankle arm index in the point to 2.3 range bilaterally.  Obviously continued with critical limb ischemia despite intervention in March.  Presents now to the [facility] emergency room last night with gangrene and nonsalvageable left foot.  Gangrenous changes on his right foot with questionable viability.  He is an extremely poor historian.  He reports that he has not walked for quite some time.  He does report that he is able to transfer from bed to bathroom.  He reports that he lives with his girlfriend.  His left foot is  extremely malodorous.  Extensive gangrene in the entire foot.  Blistering up onto his calf.  Only option is left above-knee amputation.  I discussed this with the patient who understands and agrees to proceed.  Regarding his right foot.  He does have critical ischemia here as well.  No invasive infection.  He has a full-thickness dry eschar on his right heel and also full-thickness dry eschar on the medial aspect of his first metatarsal head.  Also has nonhealing wound on the right pretibial area.  Patient is at extremely high risk for right leg amputation as well but not urgent at this time.  We will plan amputation and then determine if any options are available for right leg limb salvage.  Suspect that he will end up with right leg amputation as well.  We will proceed with left above-knee amputation today.  Curt Jews, MD 12/24/2019 9:09 AM

## 2019-12-24 NOTE — Anesthesia Postprocedure Evaluation (Signed)
Anesthesia Post Note  Patient: Terrence Carr  Procedure(s) Performed: LEFT ABOVE KNEE AMPUTATION (Left Leg Upper)     Patient location during evaluation: PACU Anesthesia Type: General Level of consciousness: awake and alert Pain management: pain level controlled Vital Signs Assessment: post-procedure vital signs reviewed and stable Respiratory status: spontaneous breathing, nonlabored ventilation, respiratory function stable and patient connected to nasal cannula oxygen Cardiovascular status: blood pressure returned to baseline and stable Postop Assessment: no apparent nausea or vomiting Anesthetic complications: no    Last Vitals:  Vitals:   12/24/19 1240 12/24/19 1251  BP: 126/66 126/64  Pulse: 72 72  Resp: 14 15  Temp: 36.4 C 36.9 C  SpO2: 100% 100%    Last Pain:  Vitals:   12/24/19 1240  TempSrc:   PainSc: Byng DAVID

## 2019-12-24 NOTE — Anesthesia Preprocedure Evaluation (Signed)
Anesthesia Evaluation  Patient identified by MRN, date of birth, ID band Patient awake    Reviewed: Allergy & Precautions, NPO status , Patient's Chart, lab work & pertinent test results  Airway Mallampati: I  TM Distance: >3 FB Neck ROM: Full    Dental   Pulmonary    Pulmonary exam normal        Cardiovascular hypertension, Pt. on medications + Peripheral Vascular Disease  Normal cardiovascular exam     Neuro/Psych    GI/Hepatic   Endo/Other    Renal/GU      Musculoskeletal   Abdominal   Peds  Hematology   Anesthesia Other Findings   Reproductive/Obstetrics                             Anesthesia Physical Anesthesia Plan  ASA: III  Anesthesia Plan: General   Post-op Pain Management:    Induction: Intravenous  PONV Risk Score and Plan: 2 and Ondansetron and Midazolam  Airway Management Planned: LMA  Additional Equipment:   Intra-op Plan:   Post-operative Plan: Extubation in OR  Informed Consent: I have reviewed the patients History and Physical, chart, labs and discussed the procedure including the risks, benefits and alternatives for the proposed anesthesia with the patient or authorized representative who has indicated his/her understanding and acceptance.       Plan Discussed with: CRNA  Anesthesia Plan Comments:         Anesthesia Quick Evaluation

## 2019-12-25 ENCOUNTER — Inpatient Hospital Stay (HOSPITAL_COMMUNITY): Payer: Medicaid Other

## 2019-12-25 LAB — CBC
HCT: 21.6 % — ABNORMAL LOW (ref 39.0–52.0)
Hemoglobin: 7.4 g/dL — ABNORMAL LOW (ref 13.0–17.0)
MCH: 33 pg (ref 26.0–34.0)
MCHC: 34.3 g/dL (ref 30.0–36.0)
MCV: 96.4 fL (ref 80.0–100.0)
Platelets: 391 10*3/uL (ref 150–400)
RBC: 2.24 MIL/uL — ABNORMAL LOW (ref 4.22–5.81)
RDW: 17.3 % — ABNORMAL HIGH (ref 11.5–15.5)
WBC: 5.3 10*3/uL (ref 4.0–10.5)
nRBC: 0 % (ref 0.0–0.2)

## 2019-12-25 LAB — BASIC METABOLIC PANEL
Anion gap: 9 (ref 5–15)
BUN: 6 mg/dL (ref 6–20)
CO2: 24 mmol/L (ref 22–32)
Calcium: 7.8 mg/dL — ABNORMAL LOW (ref 8.9–10.3)
Chloride: 101 mmol/L (ref 98–111)
Creatinine, Ser: 0.7 mg/dL (ref 0.61–1.24)
GFR calc Af Amer: >60 mL/min (ref >60)
GFR calc non Af Amer: >60 mL/min (ref >60)
Glucose, Bld: 100 mg/dL — ABNORMAL HIGH (ref 70–99)
Potassium: 3.9 mmol/L (ref 3.5–5.1)
Sodium: 134 mmol/L — ABNORMAL LOW (ref 135–145)

## 2019-12-25 LAB — MAGNESIUM: Magnesium: 1.9 mg/dL (ref 1.7–2.4)

## 2019-12-25 MED ORDER — GABAPENTIN 100 MG PO CAPS
100.0000 mg | ORAL_CAPSULE | Freq: Three times a day (TID) | ORAL | Status: DC
  Administered 2019-12-25 – 2019-12-31 (×17): 100 mg via ORAL
  Filled 2019-12-25 (×18): qty 1

## 2019-12-25 MED ORDER — VITAMIN B-12 1000 MCG PO TABS
1000.0000 ug | ORAL_TABLET | Freq: Every day | ORAL | Status: DC
  Administered 2019-12-25 – 2019-12-31 (×6): 1000 ug via ORAL
  Filled 2019-12-25 (×6): qty 1

## 2019-12-25 NOTE — Plan of Care (Signed)
  Problem: Education: Goal: Knowledge of General Education information will improve Description: Including pain rating scale, medication(s)/side effects and non-pharmacologic comfort measures Outcome: Progressing   Problem: Health Behavior/Discharge Planning: Goal: Ability to manage health-related needs will improve Outcome: Progressing   Problem: Activity: Goal: Risk for activity intolerance will decrease Outcome: Progressing   

## 2019-12-25 NOTE — Evaluation (Signed)
Physical Therapy Evaluation Patient Details Name: Terrence Carr MRN: [id number] DOB: 1961-03-08 Today's Date: 12/25/2019   History of Present Illness  Terrence Carr is a 58 y.o. male with medical history significant for hypertension, peripheral arterial occlusive disease, tobacco use disorder, alcohol abuse, peripheral neuropathy, vitamin B12 deficiency and history of severe protein calorie malnutrition who presents to the emergency department due to 3-week history of progressive and worsening wounds to bilateral lower extremities. S/P LAKA 12/24.  Clinical Impression  PTA pt living with girlfriend and grandchildren in single story home with steps to enter. Pt ambulated household distances with a cane or RW and was independent in ADLS and iADLS. Pt is currently limited in safe mobility by decreased balance from change in COG as well as decreased strength. Pt is mod A for bed mobility, and maxAx2 for transfers. Pt has 24 hour care and would benefit from CIR level rehab to return to PLOF. PT will continue to follow acutely.    Follow Up Recommendations CIR    Equipment Recommendations  Other (comment)(TBD at next venue)    Recommendations for Other Services Rehab consult     Precautions / Restrictions Precautions Precautions: Fall Restrictions Weight Bearing Restrictions: Yes LLE Weight Bearing: Non weight bearing      Mobility  Bed Mobility Overal bed mobility: Needs Assistance Bed Mobility: Rolling;Sidelying to Sit Rolling: Mod assist Sidelying to sit: Mod assist;HOB elevated       General bed mobility comments: VCs for technique use of bed pad  Transfers Overall transfer level: Needs assistance Equipment used: Rolling walker (2 wheeled) Transfers: Sit to/from Stand Sit to Stand: Max assist;+2 physical assistance         General transfer comment: Pt with difficulty with fully upright needed cues to try to stand up straight by tucking his buttocks       Balance Overall balance assessment: Needs assistance Sitting-balance support: No upper extremity supported(RLE supported) Sitting balance-Leahy Scale: Fair     Standing balance support: Bilateral upper extremity supported;During functional activity Standing balance-Leahy Scale: Zero Standing balance comment: reliant on RW and additonal A +2 from therapists                             Pertinent Vitals/Pain Pain Assessment: 0-10 Pain Score: 9  Pain Location: LLE Pain Descriptors / Indicators: Aching;Sore Pain Intervention(s): Limited activity within patient's tolerance;Monitored during session;Repositioned    Home Living Family/patient expects to be discharged to:: Private residence Living Arrangements: Spouse/significant other;Other relatives Available Help at Discharge: Family;Available 24 hours/day Type of Home: House Home Access: Stairs to enter   [employer] of Steps: 1 Home Layout: One level Home Equipment: Walker - 4 wheels;Shower seat      Prior Function Level of Independence: Independent with assistive device(s)         Comments: RW     Hand Dominance   Dominant Hand: Right    Extremity/Trunk Assessment   Upper Extremity Assessment Upper Extremity Assessment: Defer to OT evaluation    Lower Extremity Assessment Lower Extremity Assessment: LLE deficits/detail;RLE deficits/detail RLE Deficits / Details: strength grossly 3+/5, ROM in hip and knee lacking full extension RLE Sensation: decreased light touch RLE Coordination: decreased fine motor LLE Deficits / Details: L AKA       Communication   Communication: No difficulties  Cognition Arousal/Alertness: Awake/alert Behavior During Therapy: WFL for tasks assessed/performed Overall Cognitive Status: Impaired/Different from baseline Area of Impairment: Safety/judgement  Safety/Judgement: Decreased awareness of safety(with transfers)             General Comments General comments (skin integrity, edema, etc.): R heel boggy, open wound on ankle         Assessment/Plan    PT Assessment Patient needs continued PT services  PT Problem List Decreased strength;Decreased range of motion;Decreased activity tolerance;Decreased balance;Decreased mobility;Decreased cognition;Decreased safety awareness;Pain       PT Treatment Interventions DME instruction;Gait training;Stair training;Functional mobility training;Therapeutic activities;Therapeutic exercise;Balance training;Cognitive remediation;Wheelchair mobility training    PT Goals (Current goals can be found in the Care Plan section)  Acute Rehab PT Goals Patient Stated Goal: to get some coffee and pain medication PT Goal Formulation: With patient Time For Goal Achievement: 01/08/20 Potential to Achieve Goals: Good    Frequency Min 3X/week   Barriers to discharge        Co-evaluation PT/OT/SLP Co-Evaluation/Treatment: Yes Reason for Co-Treatment: For patient/therapist safety PT goals addressed during session: Mobility/safety with mobility         AM-PAC PT "6 Clicks" Mobility  Outcome Measure Help needed turning from your back to your side while in a flat bed without using bedrails?: None Help needed moving from lying on your back to sitting on the side of a flat bed without using bedrails?: A Lot Help needed moving to and from a bed to a chair (including a wheelchair)?: Total Help needed standing up from a chair using your arms (e.g., wheelchair or bedside chair)?: Total Help needed to walk in hospital room?: Total Help needed climbing 3-5 steps with a railing? : Total 6 Click Score: 10    End of Session Equipment Utilized During Treatment: Gait belt Activity Tolerance: Patient tolerated treatment well Patient left: in chair;with call bell/phone within reach;with chair alarm set Nurse Communication: Mobility status PT Visit Diagnosis: Unsteadiness on feet  (R26.81);Other abnormalities of gait and mobility (R26.89);Muscle weakness (generalized) (M62.81);Difficulty in walking, not elsewhere classified (R26.2);Pain Pain - Right/Left: Left Pain - part of body: Leg(op site)    Time: [id number] PT Time Calculation (min) (ACUTE ONLY): 22 min   Charges:   PT Evaluation $PT Eval Moderate Complexity: 1 Mod          Kelena Garrow B. Migdalia Dk PT, DPT Acute Rehabilitation Services Pager [phone] Office [phone]   [employer] 12/25/2019, 10:12 AM

## 2019-12-25 NOTE — Evaluation (Signed)
Occupational Therapy Evaluation Patient Details Name: Terrence Carr MRN: [id number] DOB: 1961/06/04 Today's Date: 12/25/2019    History of Present Illness Terrence Carr is a 58 y.o. male with medical history significant for hypertension, peripheral arterial occlusive disease, tobacco use disorder, alcohol abuse, peripheral neuropathy, vitamin B12 deficiency and history of severe protein calorie malnutrition who presents to the emergency department due to 3-week history of progressive and worsening wounds to bilateral lower extremities. S/P [facility] 12/24.   Clinical Impression   This 58 yo male admitted and underwent above presents to acute OT with increased pain, decreased balance, decreased mobility, and decreased safety awareness thus affecting his PLOF of being independent to Mod I with all basic ADLs. He will benefit from acute OT with follow up on CIR to progress from a +2 A to a S at W/C level or better.    Follow Up Recommendations  CIR;Supervision/Assistance - 24 hour    Equipment Recommendations  3 in 1 bedside commode;Wheelchair (measurements OT);Wheelchair cushion (measurements OT)       Precautions / Restrictions Precautions Precautions: Fall Restrictions Weight Bearing Restrictions: Yes LLE Weight Bearing: Non weight bearing      Mobility Bed Mobility Overal bed mobility: Needs Assistance Bed Mobility: Rolling;Sidelying to Sit Rolling: Mod assist Sidelying to sit: Mod assist;HOB elevated       General bed mobility comments: VCs for technique use of bed pad  Transfers Overall transfer level: Needs assistance Equipment used: Rolling walker (2 wheeled) Transfers: Sit to/from Stand Sit to Stand: Max assist;+2 physical assistance         General transfer comment: Pt with difficulty with fully upright needed cues to try to stand up straight by tucking his buttocks    Balance Overall balance assessment: Needs assistance Sitting-balance support: No upper  extremity supported(RLE supported) Sitting balance-Leahy Scale: Fair     Standing balance support: Bilateral upper extremity supported;During functional activity Standing balance-Leahy Scale: Zero Standing balance comment: reliant on RW and additonal A +2 from therapists                           ADL either performed or assessed with clinical judgement   ADL Overall ADL's : Needs assistance/impaired Eating/Feeding: Independent;Sitting   Grooming: Set up;Sitting   Upper Body Bathing: Set up;Sitting   Lower Body Bathing: Maximal assistance Lower Body Bathing Details (indicate cue type and reason): Max A +2 sit<>stand Upper Body Dressing : Minimal assistance;Sitting   Lower Body Dressing: Total assistance Lower Body Dressing Details (indicate cue type and reason): Max A +2 sit<>stand Toilet Transfer: Maximal assistance;+2 for physical assistance;RW;Stand-pivot Toilet Transfer Details (indicate cue type and reason): Bed>recliner on pt's right Toileting- Clothing Manipulation and Hygiene: Total assistance Toileting - Clothing Manipulation Details (indicate cue type and reason): Max A +2 sit<>stand             Vision Patient Visual Report: No change from baseline              Pertinent Vitals/Pain Pain Assessment: 0-10 Pain Score: 9  Pain Location: LLE Pain Descriptors / Indicators: Aching;Sore Pain Intervention(s): Limited activity within patient's tolerance;Monitored during session;Repositioned     Hand Dominance Right   Extremity/Trunk Assessment Upper Extremity Assessment Upper Extremity Assessment: Overall WFL for tasks assessed           Communication Communication Communication: No difficulties   Cognition Arousal/Alertness: Awake/alert Behavior During Therapy: WFL for tasks assessed/performed Overall Cognitive Status: Impaired/Different from  baseline Area of Impairment: Safety/judgement                         Safety/Judgement:  Decreased awareness of safety(with transfers)                    Home Living Family/patient expects to be discharged to:: Private residence Living Arrangements: Spouse/significant other;Other relatives Available Help at Discharge: Family;Available 24 hours/day Type of Home: House Home Access: Stairs to enter [employer] of Steps: 1   Home Layout: One level     Bathroom Shower/Tub: Occupational psychologist: Standard     Home Equipment: Environmental consultant - 4 wheels;Shower seat          Prior Functioning/Environment Level of Independence: Independent with assistive device(s)        Comments: RW        OT Problem List: Decreased strength;Impaired balance (sitting and/or standing);Pain;Decreased knowledge of use of DME or AE      OT Treatment/Interventions: Self-care/ADL training;DME and/or AE instruction;Patient/family education;Balance training    OT Goals(Current goals can be found in the care plan section) Acute Rehab OT Goals Patient Stated Goal: to get some coffee and pain medication OT Goal Formulation: With patient Time For Goal Achievement: 01/08/20 Potential to Achieve Goals: Good  OT Frequency: Min 2X/week              AM-PAC OT "6 Clicks" Daily Activity     Outcome Measure Help from another person eating meals?: None Help from another person taking care of personal grooming?: A Little Help from another person toileting, which includes using toliet, bedpan, or urinal?: A Lot Help from another person bathing (including washing, rinsing, drying)?: A Lot Help from another person to put on and taking off regular upper body clothing?: A Little Help from another person to put on and taking off regular lower body clothing?: Total 6 Click Score: 15   End of Session Equipment Utilized During Treatment: Gait belt;Rolling walker Nurse Communication: Mobility status;Patient requests pain meds  Activity Tolerance: Patient tolerated treatment  well Patient left: in chair;with call bell/phone within reach;with chair alarm set;with nursing/sitter in room  OT Visit Diagnosis: Unsteadiness on feet (R26.81);Other abnormalities of gait and mobility (R26.89);Muscle weakness (generalized) (M62.81);Pain Pain - Right/Left: Left Pain - part of body: Leg                Time: [id number] OT Time Calculation (min): 21 min Charges:  OT General Charges $OT Visit: 1 Visit OT Evaluation $OT Eval Moderate Complexity: [place], OTR/L Acute [employer] Pager [phone] Office [phone]    Almon Register 12/25/2019, 9:00 AM

## 2019-12-25 NOTE — Progress Notes (Signed)
[facility]  Subjective  - no complaints.   Objective 126/64 76 98.4 F (36.9 C) (Oral) 18 100%  Intake/Output Summary (Last 24 hours) at 12/25/2019 0925 Last data filed at 12/25/2019 0541 Gross per 24 hour  Intake 988.54 ml  Output 1250 ml  Net -261.46 ml    L AKA dressing c/d/i Right foot with dry wound on 1st metatarsal head and right shin wounds as well  Laboratory Lab Results: Recent Labs    12/24/19 0753 12/25/19 0359  WBC 7.1 5.3  HGB 8.3* 7.4*  HCT 24.5* 21.6*  PLT 467* 391   BMET Recent Labs    12/24/19 0753 12/25/19 0359  NA 130* 134*  K 3.9 3.9  CL 97* 101  CO2 22 24  GLUCOSE 100* 100*  BUN 8 6  CREATININE 0.71 0.70  CALCIUM 7.9* 7.8*    COAG No results found for: INR, PROTIME No results found for: PTT  Assessment/Planning:  Postop day 1 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 Donnetta Hutching.  Dressing is clean dry and intact and pain seems reasonably well controlled.  Can consult PT OT etc.  We will also order ABIs of the right lower extremity given non-palpable pedal pulses and wounds.  We will make a decision about proceeding with arteriogram, possible intervention for limb salvage of the right foot.  Reportedly had a right femoral endarterectomy and right iliac stent at [facility] in March of this year.  Marty Heck 12/25/2019 9:25 AM --

## 2019-12-25 NOTE — Progress Notes (Signed)
[facility] Progress Note  Patient: Terrence Carr [id number]   PCP: Patient, No Pcp Per DOB: 1961/04/24   DOA: 12/23/2019   DOS: 12/25/2019   Date of Service: the patient was seen and examined on 12/25/2019  Chief Complaint  Patient presents with  . Wound Check   Brief hospital course: Pt. with PMH of HTN, PAD, active smoker, alcohol abuse, peripheral neuropathy, B12 deficiency, severe protein calorie malnutrition; presented with complain of bilateral leg wound and pain progressively worsening over last 3 weeks, was found to have extensive gangrene of left foot.  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  Currently further plan is postoperative recovery.  Subjective: Reports generalized body ache.  Also reports pain at the surgical site.  No nausea no vomiting.  Minimal oral intake.  No fever no chills.  Assessment and Plan: Scheduled Meds: . enoxaparin (LOVENOX) injection  40 mg Subcutaneous Q24H  . gabapentin  100 mg Oral TID  . LORazepam  0-4 mg Intravenous Q6H   Or  . LORazepam  0-4 mg Oral Q6H  . [START ON 12/26/2019] LORazepam  0-4 mg Intravenous Q12H   Or  . [START ON 12/26/2019] LORazepam  0-4 mg Oral Q12H  . mupirocin ointment  1 application Topical BID  . thiamine  100 mg Oral Daily  . vitamin B-12  1,000 mcg Oral Daily   Continuous Infusions: . ceFEPime (MAXIPIME) IV 2 g (12/25/19 1302)  . vancomycin 1,000 mg (12/25/19 0854)   PRN Meds: acetaminophen **OR** acetaminophen, oxyCODONE-acetaminophen  1.  Peripheral vascular disease Critical limb ischemia bilaterally Left foot gangrene. Osteomyelitis SP left AKA Appreciate vascular surgery assistance. Patient underwent left AKA. Tolerated procedure very well. Suspecting that the patient will also require right leg amputation but currently not urgent at this time.. Patient had significant lactic acidosis at the time of admission requiring IV hydration suspect that is from  hypoperfusion. Given his presentation, will continue with the antibiotics for now and treat for at least 7 days.  Transitioning to oral on 12/26/2019, will discuss with ID before that given the evidence of osteomyelitis on right leg which is still not operated on.  2.  Alcohol abuse Alcohol withdrawal Continue CIWA protocol. Continue thiamine.  3.  Macrocytic anemia Severe B12 deficiency. B12 injection subcutaneously x1 followed by oral B12 daily. Also continue oral folic acid.  4.  Reactive thrombocytosis. Platelet count significantly elevated at the time of the admission. 533, down to 467. Monitor.  5.  Severe protein calorie malnutrition. Likely from alcohol abuse. Dietary consultation. At risk for poor wound healing. Also at risk for third spacing from hypoalbuminemia. Monitor.  6.  Left buttock pressure ulcer stage II POA Continue foam dressing.  Pressure Injury 12/23/19 Buttocks Left;Right;Posterior;Lower;Upper;Mid Stage 2 -  Partial thickness loss of dermis presenting as a shallow open injury with a red, pink wound bed without slough. multiple pressure injuries to buttocks.  (Active)  12/23/19 2102  Location: Buttocks  Location Orientation: Left;Right;Posterior;Lower;Upper;Mid  Staging: Stage 2 -  Partial thickness loss of dermis presenting as a shallow open injury with a red, pink wound bed without slough.  Wound Description (Comments): multiple pressure injuries to buttocks.   Present on Admission: Yes   7.  Generalized body ache. Added gabapentin.  Diet: Cardiac diet and Regular diet  DVT Prophylaxis: Subcutaneous Lovenox   Advance goals of care discussion: Full code  Family Communication: no family was present at bedside, at the time of interview.  Disposition:  Discharge to  be determined.  Consultants: Vascular surgery Procedures: Left AKA  Antibiotics: Anti-infectives (From admission, onward)   Start     Dose/Rate Route Frequency Ordered Stop    12/24/19 0800  vancomycin (VANCOCIN) IVPB 1000 mg/200 mL premix     1,000 mg 200 mL/hr over 60 Minutes Intravenous Every 12 hours 12/23/19 1950     12/24/19 0500  ceFEPIme (MAXIPIME) 2 g in sodium chloride 0.9 % 100 mL IVPB     2 g 200 mL/hr over 30 Minutes Intravenous Every 8 hours 12/24/19 0017     12/23/19 2045  ceFEPIme (MAXIPIME) 2 g in sodium chloride 0.9 % 100 mL IVPB     2 g 200 mL/hr over 30 Minutes Intravenous  Once 12/23/19 2036 12/23/19 2148   12/23/19 1930  ceFAZolin (ANCEF) IVPB 1 g/50 mL premix  Status:  Discontinued     2 g 200 mL/hr over 30 Minutes Intravenous  Once 12/23/19 1918 12/23/19 2036   12/23/19 1900  vancomycin (VANCOREADY) IVPB 1500 mg/300 mL     1,500 mg 150 mL/hr over 120 Minutes Intravenous  Once 12/23/19 1857 12/23/19 2231       Objective: Physical Exam: Vitals:   12/24/19 1931 12/25/19 0324 12/25/19 0832 12/25/19 1429  BP: (!) 107/56 (!) 122/59 126/64 126/62  Pulse: 73 79 76 76  Resp: 16 18 18 18   Temp: 98.4 F (36.9 C) 98.6 F (37 C) 98.4 F (36.9 C) 98.6 F (37 C)  TempSrc: Oral Oral Oral Oral  SpO2: 98% 97% 100% 100%  Weight:      Height:        Intake/Output Summary (Last 24 hours) at 12/25/2019 1510 Last data filed at 12/25/2019 0900 Gross per 24 hour  Intake 728.54 ml  Output 1600 ml  Net -871.46 ml   Filed Weights   12/23/19 1855 12/24/19 1008  Weight: 74.8 kg 74.8 kg   General: alert and oriented to time, place, and person. Appear in moderate distress, affect appropriate Eyes: PERRL, Conjunctiva normal ENT: Oral Mucosa Clear, moist  Neck: no JVD, no Abnormal Mass Or lumps Cardiovascular: S1 and S2 Present, no Murmur,  Respiratory: good respiratory effort, Bilateral Air entry equal and Decreased, no signs of accessory muscle use, Clear to Auscultation, no Crackles, no wheezes Abdomen: Bowel Sound present, Soft and no tenderness, no hernia Skin: no rashes  Extremities: no Pedal edema, no calf tenderness Neurologic: without  any new focal findings no asterixis Gait not checked due to patient safety concerns  Data Reviewed: I have personally reviewed and interpreted daily labs, tele strips, imagings as discussed above. I reviewed all nursing notes, pharmacy notes, vitals, pertinent old records I have discussed plan of care as described above with RN and patient/family.  CBC: Recent Labs  Lab 12/23/19 1854 12/24/19 0753 12/25/19 0359  WBC 7.1 7.1 5.3  NEUTROABS 5.3 5.9  --   HGB 9.7* 8.3* 7.4*  HCT 29.8* 24.5* 21.6*  MCV 101.4* 97.2 96.4  PLT 533* 467* 0000000   Basic Metabolic Panel: Recent Labs  Lab 12/23/19 1854 12/24/19 0419 12/24/19 0753 12/25/19 0359  NA 132*  --  130* 134*  K 3.7  --  3.9 3.9  CL 95*  --  97* 101  CO2 16*  --  22 24  GLUCOSE 59*  --  100* 100*  BUN 8  --  8 6  CREATININE 0.62  --  0.71 0.70  CALCIUM 8.5*  --  7.9* 7.8*  MG  --  1.9  2.0 1.9  PHOS  --  3.2  --   --     Liver Function Tests: Recent Labs  Lab 12/24/19 0753  AST 49*  ALT 21  ALKPHOS 189*  BILITOT 1.0  PROT 5.3*  ALBUMIN 1.6*   No results for input(s): LIPASE, AMYLASE in the last 168 hours. No results for input(s): AMMONIA in the last 168 hours. Coagulation Profile: No results for input(s): INR, PROTIME in the last 168 hours. Cardiac Enzymes: No results for input(s): CKTOTAL, CKMB, CKMBINDEX, TROPONINI in the last 168 hours. BNP (last 3 results) No results for input(s): PROBNP in the last 8760 hours. CBG: Recent Labs  Lab 12/23/19 2132 12/23/19 2312 12/24/19 0640  GLUCAP 62* 128* 89   Studies: No results found.   Time spent: 35 minutes  Author: Berle Mull, MD [facility] 12/25/2019 3:10 PM  To reach On-call, see care teams to locate the attending and reach out to them via [email or web address]. If 7PM-7AM, please contact night-coverage If you still have difficulty reaching the attending provider, please page the [facility] (Director on Call) for [facility] on amion for  assistance.

## 2019-12-26 ENCOUNTER — Inpatient Hospital Stay (HOSPITAL_COMMUNITY): Payer: Medicaid Other

## 2019-12-26 DIAGNOSIS — I96 Gangrene, not elsewhere classified: Secondary | ICD-10-CM

## 2019-12-26 LAB — BASIC METABOLIC PANEL
Anion gap: 9 (ref 5–15)
BUN: 6 mg/dL (ref 6–20)
CO2: 21 mmol/L — ABNORMAL LOW (ref 22–32)
Calcium: 7.6 mg/dL — ABNORMAL LOW (ref 8.9–10.3)
Chloride: 101 mmol/L (ref 98–111)
Creatinine, Ser: 0.7 mg/dL (ref 0.61–1.24)
GFR calc Af Amer: >60 mL/min (ref >60)
GFR calc non Af Amer: >60 mL/min (ref >60)
Glucose, Bld: 145 mg/dL — ABNORMAL HIGH (ref 70–99)
Potassium: 3.9 mmol/L (ref 3.5–5.1)
Sodium: 131 mmol/L — ABNORMAL LOW (ref 135–145)

## 2019-12-26 LAB — CBC
HCT: 21.7 % — ABNORMAL LOW (ref 39.0–52.0)
Hemoglobin: 7.3 g/dL — ABNORMAL LOW (ref 13.0–17.0)
MCH: 33.3 pg (ref 26.0–34.0)
MCHC: 33.6 g/dL (ref 30.0–36.0)
MCV: 99.1 fL (ref 80.0–100.0)
Platelets: 409 10*3/uL — ABNORMAL HIGH (ref 150–400)
RBC: 2.19 MIL/uL — ABNORMAL LOW (ref 4.22–5.81)
RDW: 17.2 % — ABNORMAL HIGH (ref 11.5–15.5)
WBC: 4.7 10*3/uL (ref 4.0–10.5)
nRBC: 0 % (ref 0.0–0.2)

## 2019-12-26 MED ORDER — OCUVITE-LUTEIN PO CAPS
1.0000 | ORAL_CAPSULE | Freq: Every day | ORAL | Status: DC
  Filled 2019-12-26: qty 1

## 2019-12-26 MED ORDER — ENSURE ENLIVE PO LIQD: 237.0000 mL | Freq: Two times a day (BID) | ORAL | Status: DC

## 2019-12-26 MED ORDER — ADULT MULTIVITAMIN W/MINERALS CH: 1.0000 | ORAL_TABLET | Freq: Every day | ORAL | Status: DC

## 2019-12-26 MED ORDER — JUVEN PO PACK
1.0000 | PACK | Freq: Two times a day (BID) | ORAL | Status: DC
  Administered 2019-12-29 – 2019-12-31 (×3): 1 via ORAL
  Filled 2019-12-26 (×12): qty 1

## 2019-12-26 MED ORDER — PROSIGHT PO TABS
1.0000 | ORAL_TABLET | Freq: Every day | ORAL | Status: DC
  Administered 2019-12-27 – 2019-12-29 (×3): 1 via ORAL
  Filled 2019-12-26 (×3): qty 1

## 2019-12-26 NOTE — Progress Notes (Signed)
ABI completed. Refer to "CV Proc" under chart review to view preliminary results. Preliminary results discussed with Dr. Carlis Abbott.  12/26/2019 3:07 PM Kelby Aline., MHA, RVT, RDCS, RDMS

## 2019-12-26 NOTE — Progress Notes (Signed)
[facility] Progress Note  Patient: Terrence Carr [id number]   PCP: Patient, No Pcp Per DOB: 01/29/1961   DOA: 12/23/2019   DOS: 12/26/2019   Date of Service: the patient was seen and examined on 12/26/2019  Chief Complaint  Patient presents with   Wound Check   Brief hospital course: Pt. with PMH of HTN, PAD, active smoker, alcohol abuse, peripheral neuropathy, B12 deficiency, severe protein calorie malnutrition; presented with complain of bilateral leg wound and pain progressively worsening over last 3 weeks, was found to have extensive gangrene of left foot.  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  Currently further plan is postoperative recovery.  Subjective: Denies any acute complaint.  Pain is moderately well controlled.  No fever no chills.  Assessment and Plan: Scheduled Meds:  enoxaparin (LOVENOX) injection  40 mg Subcutaneous Q24H   [START ON 12/27/2019] feeding supplement (ENSURE ENLIVE)  237 mL Oral BID BM   gabapentin  100 mg Oral TID   LORazepam  0-4 mg Intravenous Q12H   Or   LORazepam  0-4 mg Oral Q12H   multivitamin  1 tablet Oral Daily   mupirocin ointment  1 application Topical BID   [START ON 12/27/2019] nutrition supplement (JUVEN)  1 packet Oral BID BM   thiamine  100 mg Oral Daily   vitamin B-12  1,000 mcg Oral Daily   Continuous Infusions:  vancomycin 1,000 mg (12/26/19 0859)   PRN Meds: acetaminophen **OR** acetaminophen, oxyCODONE-acetaminophen  1.  Peripheral vascular disease Critical limb ischemia bilaterally Left foot gangrene. Osteomyelitis SP left AKA Appreciate vascular surgery assistance. Patient underwent left AKA. Tolerated procedure very well. Suspecting that the patient will also require right leg amputation but currently not urgent at this time.. Patient had significant lactic acidosis at the time of admission requiring IV hydration suspect that is from hypoperfusion. Given his presentation,  will continue with the antibiotics for now  will need to discuss with ID before that given the evidence of osteomyelitis on right leg which is still not operated on.  2.  Alcohol abuse Alcohol withdrawal Continue CIWA protocol. Continue thiamine.  3.  Macrocytic anemia Severe B12 deficiency. B12 injection subcutaneously x1 followed by oral B12 daily. Also continue oral folic acid.  4.  Reactive thrombocytosis. Platelet count significantly elevated at the time of the admission. 533, down to 467. Monitor.  5.  Severe protein calorie malnutrition. Likely from alcohol abuse. Dietary consultation. At risk for poor wound healing. Also at risk for third spacing from hypoalbuminemia. Monitor.  6.  Left buttock pressure ulcer stage II POA Continue foam dressing.  Pressure Injury 12/23/19 Buttocks Left;Right;Posterior;Lower;Upper;Mid Stage 2 -  Partial thickness loss of dermis presenting as a shallow open injury with a red, pink wound bed without slough. multiple pressure injuries to buttocks.  (Active)  12/23/19 2102  Location: Buttocks  Location Orientation: Left;Right;Posterior;Lower;Upper;Mid  Staging: Stage 2 -  Partial thickness loss of dermis presenting as a shallow open injury with a red, pink wound bed without slough.  Wound Description (Comments): multiple pressure injuries to buttocks.   Present on Admission: Yes   7.  Generalized body ache. Added gabapentin.  Diet: Cardiac diet and Regular diet  DVT Prophylaxis: Subcutaneous Lovenox   Advance goals of care discussion: Full code  Family Communication: no family was present at bedside, at the time of interview.  Disposition:  Discharge to be determined.  Consultants: Vascular surgery Procedures: Left AKA  Antibiotics: Anti-infectives (From admission, onward)   Start  Dose/Rate Route Frequency Ordered Stop   12/24/19 0800  vancomycin (VANCOCIN) IVPB 1000 mg/200 mL premix     1,000 mg 200 mL/hr over 60  Minutes Intravenous Every 12 hours 12/23/19 1950     12/24/19 0500  ceFEPIme (MAXIPIME) 2 g in sodium chloride 0.9 % 100 mL IVPB  Status:  Discontinued     2 g 200 mL/hr over 30 Minutes Intravenous Every 8 hours 12/24/19 0017 12/26/19 0750   12/23/19 2045  ceFEPIme (MAXIPIME) 2 g in sodium chloride 0.9 % 100 mL IVPB     2 g 200 mL/hr over 30 Minutes Intravenous  Once 12/23/19 2036 12/23/19 2148   12/23/19 1930  ceFAZolin (ANCEF) IVPB 1 g/50 mL premix  Status:  Discontinued     2 g 200 mL/hr over 30 Minutes Intravenous  Once 12/23/19 1918 12/23/19 2036   12/23/19 1900  vancomycin (VANCOREADY) IVPB 1500 mg/300 mL     1,500 mg 150 mL/hr over 120 Minutes Intravenous  Once 12/23/19 1857 12/23/19 2231       Objective: Physical Exam: Vitals:   12/26/19 1300 12/26/19 1748 12/26/19 1757 12/26/19 1926  BP: 116/64 118/66 118/66 116/64  Pulse: 86 80 80 77  Resp: 18  16   Temp: 98.6 F (37 C)   99 F (37.2 C)  TempSrc: Oral   Oral  SpO2: 100%  100% 100%  Weight:      Height:        Intake/Output Summary (Last 24 hours) at 12/26/2019 2111 Last data filed at 12/26/2019 1718 Gross per 24 hour  Intake 720 ml  Output 700 ml  Net 20 ml   Filed Weights   12/23/19 1855 12/24/19 1008  Weight: 74.8 kg 74.8 kg   General: alert and oriented to time, place, and person. Appear in moderate distress, affect appropriate Eyes: PERRL, Conjunctiva normal ENT: Oral Mucosa Clear, moist  Neck: no JVD, no Abnormal Mass Or lumps Cardiovascular: S1 and S2 Present, no Murmur,  Respiratory: good respiratory effort, Bilateral Air entry equal and Decreased, no signs of accessory muscle use, Clear to Auscultation, no Crackles, no wheezes Abdomen: Bowel Sound present, Soft and no tenderness, no hernia Skin: no rashes  Extremities: no Pedal edema, no calf tenderness Neurologic: without any new focal findings no asterixis Gait not checked due to patient safety concerns  Data Reviewed: I have personally  reviewed and interpreted daily labs, tele strips, imagings as discussed above. I reviewed all nursing notes, pharmacy notes, vitals, pertinent old records I have discussed plan of care as described above with RN and patient/family.  CBC: Recent Labs  Lab 12/23/19 1854 12/24/19 0753 12/25/19 0359 12/26/19 0727  WBC 7.1 7.1 5.3 4.7  NEUTROABS 5.3 5.9  --   --   HGB 9.7* 8.3* 7.4* 7.3*  HCT 29.8* 24.5* 21.6* 21.7*  MCV 101.4* 97.2 96.4 99.1  PLT 533* 467* 391 [id number]   Basic Metabolic Panel: Recent Labs  Lab 12/23/19 1854 12/24/19 0419 12/24/19 0753 12/25/19 0359 12/26/19 0727  NA 132*  --  130* 134* 131*  K 3.7  --  3.9 3.9 3.9  CL 95*  --  97* 101 101  CO2 16*  --  22 24 21*  GLUCOSE 59*  --  100* 100* 145*  BUN 8  --  8 6 6   CREATININE 0.62  --  0.71 0.70 0.70  CALCIUM 8.5*  --  7.9* 7.8* 7.6*  MG  --  1.9 2.0 1.9  --  PHOS  --  3.2  --   --   --     Liver Function Tests: Recent Labs  Lab 12/24/19 0753  AST 49*  ALT 21  ALKPHOS 189*  BILITOT 1.0  PROT 5.3*  ALBUMIN 1.6*   No results for input(s): LIPASE, AMYLASE in the last 168 hours. No results for input(s): AMMONIA in the last 168 hours. Coagulation Profile: No results for input(s): INR, PROTIME in the last 168 hours. Cardiac Enzymes: No results for input(s): CKTOTAL, CKMB, CKMBINDEX, TROPONINI in the last 168 hours. BNP (last 3 results) No results for input(s): PROBNP in the last 8760 hours. CBG: Recent Labs  Lab 12/23/19 2132 12/23/19 2312 12/24/19 0640  GLUCAP 62* 128* 89   Studies: VAS [place] ABI WITH/WO TBI  Result Date: 12/26/2019 LOWER EXTREMITY DOPPLER STUDY Indications: Gangrene.  Vascular Interventions: Left AKA. Comparison Study: No prior study. Performing Technologist: Maudry Mayhew MHA, RVT, RDCS, RDMS  Examination Guidelines: A complete evaluation includes at minimum, Doppler waveform signals and systolic blood pressure reading at the level of bilateral brachial, anterior tibial,  and posterior tibial arteries, when vessel segments are accessible. Bilateral testing is considered an integral part of a complete examination. Photoelectric Plethysmograph (PPG) waveforms and toe systolic pressure readings are included as required and additional duplex testing as needed. Limited examinations for reoccurring indications may be performed as noted.  ABI Findings: +--------+------------------+-----+-------------------+--------+  Right    Rt Pressure (mmHg) Index Waveform            Comment   +--------+------------------+-----+-------------------+--------+  Brachial 120                      triphasic                     +--------+------------------+-----+-------------------+--------+  PTA                               dampened monophasic           +--------+------------------+-----+-------------------+--------+  DP                                dampened monophasic           +--------+------------------+-----+-------------------+--------+ +--------+------------------+-----+--------+-------------+  Left     Lt Pressure (mmHg) Index Waveform Comment        +--------+------------------+-----+--------+-------------+  Brachial                                   Not evaluated  +--------+------------------+-----+--------+-------------+  Summary: Right: Abnormal right pedal waveforms suggestive of possible severe arterial insufficiency. Patient unable to tolerate blood pressure cuff, therefore unable to obtain right ABI.  *See table(s) above for measurements and observations.    Preliminary      Time spent: 35 minutes  Author: Berle Mull, MD [facility] 12/26/2019 9:11 PM  To reach On-call, see care teams to locate the attending and reach out to them via [email or web address]. If 7PM-7AM, please contact night-coverage If you still have difficulty reaching the attending provider, please page the [facility] (Director on Call) for [facility] on amion for assistance.

## 2019-12-26 NOTE — Progress Notes (Signed)
Initial Nutrition Assessment  DOCUMENTATION CODES:   Not applicable  INTERVENTION:  -Ensure Enlive po BID, each supplement provides 350 kcal and 20 grams of protein  -1 packet Juven BID, each packet provides 95 calories, 2.5 grams of protein (collagen), and 9.8 grams of carbohydrate (3 grams sugar); also contains 7 grams of L-arginine and L-glutamine, 300 mg vitamin C, 15 mg vitamin E, 1.2 mcg vitamin B-12, 9.5 mg zinc, 200 mg calcium, and 1.5 g  Calcium Beta-hydroxy-Beta-methylbutyrate to support wound healing  -MVI with minerals daily  -Ocuvite-luetin daily to support wound healing  -Recommend monitoring magnesium, potassium, and phosphorus daily for at least 3 days, MD to replete as needed, as pt is at risk for refeeding syndrome given history of alcohol abuse, alcohol 23 upon presentation.  -Recommend daily folic acid  -Recommend obtaining new wt s/p AKA  NUTRITION DIAGNOSIS:   Increased nutrient needs related to acute illness, wound healing(gangrene of left foot s/p left AKA) as evidenced by estimated needs.  GOAL:   Patient will meet greater than or equal to 90% of their needs    MONITOR:   PO intake, Labs, I & O's, Supplement acceptance, Weight trends, Skin  REASON FOR ASSESSMENT:   Consult Wound healing  ASSESSMENT:  RD working remotely.  58 year old Carr with past medical history of HTN, peripheral arterial occlusive disease, alcohol abuse, peripheral neuropathy, vitamin B12 deficiency, 9/02 right femoral endarterectomy and right iliac artery stent at [facility] vascular, and history of severe protein calorie malnutrition who presents to ED with 3 week history of progressive and worsening wounds to bilateral lower extremities with yellowish malodorous discharge and severe pain. In ED,alcohol level 23; right tibia and fibula showed diffuse edematous thickening of the RLE with large ankle effusion worrisome for osteomyelitis; left foot x-ray showed soft tissue ulceration  along plantar aspect of heel and patient transferred to [facility] for vascular surgery evaluation.  Patient is s/p left AKA for gangrene of left foot on 12/24.   Per chart awaiting ABIs of the right lower extremity  given non-palpable pedal pulses and wounds. Decision about proceeding with arteriogram, possible intervention for limb salvage of right foot currently pending.   Patient with good po intake, eating 100% of 4 recorded meals this admission. RD will provide Ensure twice daily to aid with calorie/protein needs as well as Juven to support wound healing.   Current wt 74.8 kg (164.56 lb) prior to left AKA. Will use adjusted IBW + 10% (63.5kg) to calculate needs and recommend obtaining new weight to fully assess needs.   I/Os: +630 ml since admit   -320 ml x 24 hrs UOP: 800 ml x 24 hrs Medications reviewed and include: Gabapentin, Ativan, B1, B12 Vancomycin Labs: CBGs 62-128, Na 131 (L), Hgb 7.3 (L)  NUTRITION - FOCUSED PHYSICAL EXAM: Unable to complete at this time, RD working remotely.  Diet Order:   Diet Order            Diet Heart Room service appropriate? Yes; Fluid consistency: Thin  Diet effective now              EDUCATION NEEDS:   Not appropriate for education at this time  Skin:  Skin Assessment: Reviewed RN Assessment(amputation;left leg; stage II; buttocks)  Last BM:  12/26  Height:   Ht Readings from Last 1 Encounters:  12/24/19 5' 5.98" (1.676 m)    Weight:   Wt Readings from Last 1 Encounters:  12/24/19 74.8 kg    Ideal Body  Weight:  58 kg(Adjusted for AKA)  BMI:  Body mass index is 26.64 kg/m.  Estimated Nutritional Needs:   Kcal:  2000-2200  Protein:  102-114  Fluid:  >/= 2 L/day   Lajuan Lines, RD, LDN Clinical Nutrition [facility] Telephone [phone] After Hours/Weekend Pager: [phone]

## 2019-12-26 NOTE — Progress Notes (Signed)
[facility]  Subjective  - no complaints.   Objective (!) 104/56 84 99 F (37.2 C) (Oral) 18 100%  Intake/Output Summary (Last 24 hours) at 12/26/2019 1123 Last data filed at 12/26/2019 1009 Gross per 24 hour  Intake 480 ml  Output 800 ml  Net -320 ml    L AKA c/d/i after dressing removed with staples Right foot with dry wound on 1st metatarsal head and right shin wounds as well  Laboratory Lab Results: Recent Labs    12/25/19 0359 12/26/19 0727  WBC 5.3 4.7  HGB 7.4* 7.3*  HCT 21.6* 21.7*  PLT 391 409*   BMET Recent Labs    12/25/19 0359 12/26/19 0727  NA 134* 131*  K 3.9 3.9  CL 101 101  CO2 24 21*  GLUCOSE 100* 145*  BUN 6 6  CREATININE 0.70 0.70  CALCIUM 7.8* 7.6*    COAG No results found for: INR, PROTIME No results found for: PTT  Assessment/Planning:  Postop day 2 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 Donnetta Hutching.  Dressing removed at bedside and amputation looks excellent.  Staples remain for 3 weeks.  Awaiting ABIs of the right lower extremity given non-palpable pedal pulses and wounds.  We will make a decision about proceeding with arteriogram, possible intervention for limb salvage of the right foot.  Reportedly had a right femoral endarterectomy and right iliac stent at [facility] in March of this year.  Marty Heck 12/26/2019 11:23 AM --

## 2019-12-26 NOTE — Consult Note (Addendum)
Physical Medicine and Rehabilitation Consult Reason for Consult: Impaired mobility and ADLs Referring Physician: Dr. Berle Mull   HPI: Terrence Carr is a 58 y.o. male with PMH of HTN, PAD, active smoker, alcohol abuse, peripheral neuropathy, B12 deficiency, severe protein calorie malnutrition, who presented with bilateral leg wounds and pain progressively worsening over the past 3 weeks and was found to have extensive gangrene of the left foot, now postop day 2 from a left AKA for gangrene of the left foot by Dr. Donnetta Hutching (vascular surgery). Healing well. Staples to remain for 3 weeks. Pending decision regarding right foot limb salvage based on results of ABIs and possible arteriogram.   ROS + pain at surgical site, generalized body ache, minimal oral intake. Otherwise negative.  Past Medical History:  Diagnosis Date  . Hypertension    Past Surgical History:  Procedure Laterality Date  . AMPUTATION Left 12/24/2019   Procedure: LEFT ABOVE KNEE AMPUTATION;  Surgeon: Rosetta Posner, MD;  Location: [facility];  Service: Vascular;  Laterality: Left;   History reviewed. No pertinent family history. Social History:  reports that he has never smoked. He has never used smokeless tobacco. He reports that he does not drink alcohol or use drugs. Allergies: Not on File Medications Prior to Admission  Medication Sig Dispense Refill  . amLODipine (NORVASC) 5 MG tablet Take 5 mg by mouth daily.    Marland Kitchen buPROPion (WELLBUTRIN SR) 100 MG 12 hr tablet Take 100 mg by mouth 2 (two) times daily.    . chlorthalidone (HYGROTON) 25 MG tablet Take 25 mg by mouth every morning.    . clopidogrel (PLAVIX) 75 MG tablet Take 75 mg by mouth daily.    . cyanocobalamin 1000 MCG tablet Take by mouth.    . ferrous sulfate 325 (65 FE) MG tablet Take 325 mg by mouth daily.    . folic acid (FOLVITE) 1 MG tablet Take 1 mg by mouth daily.    Marland Kitchen gabapentin (NEURONTIN) 300 MG capsule Take 600 mg by mouth 3 (three) times daily.     Marland Kitchen lisinopril (ZESTRIL) 20 MG tablet Take 20 mg by mouth daily.    . metoprolol tartrate (LOPRESSOR) 25 MG tablet Take 25 mg by mouth 2 (two) times daily.    . Multiple Vitamins-Minerals (THERA-M) TABS Take 1 tablet by mouth daily.    . naltrexone (DEPADE) 50 MG tablet Take 50 mg by mouth daily.    . nicotine (NICODERM CQ - DOSED IN MG/24 HOURS) 14 mg/24hr patch Place 14 mg onto the skin daily.     . nitroGLYCERIN (NITROSTAT) 0.4 MG SL tablet Place under the tongue.    . thiamine 100 MG tablet Take by mouth.      Home: Home Living Family/patient expects to be discharged to:: Private residence Living Arrangements: Spouse/significant other, Other relatives Available Help at Discharge: Family, Available 24 hours/day Type of Home: House Home Access: Stairs to enter [employer] of Steps: 1 [place]: One level Bathroom Shower/Tub: Multimedia programmer: [place]: Environmental consultant - 4 wheels, Shower seat  Functional History: Prior Function Level of Independence: Independent with assistive device(s) Comments: RW Functional Status:  Mobility: Bed Mobility Overal bed mobility: Needs Assistance Bed Mobility: Rolling, Sidelying to Sit Rolling: Mod assist Sidelying to sit: Mod assist, HOB elevated General bed mobility comments: VCs for technique use of bed pad Transfers Overall transfer level: Needs assistance Equipment used: Rolling walker (2 wheeled) Transfers: Sit to/from Stand Sit to  Stand: Max assist, +2 physical assistance General transfer comment: Pt with difficulty with fully upright needed cues to try to stand up straight by tucking his buttocks      ADL: ADL Overall ADL's : Needs assistance/impaired Eating/Feeding: Independent, Sitting Grooming: Set up, Sitting Upper Body Bathing: Set up, Sitting Lower Body Bathing: Maximal assistance Lower Body Bathing Details (indicate cue type and reason): Max A +2 sit<>stand Upper Body Dressing : Minimal  assistance, Sitting Lower Body Dressing: Total assistance Lower Body Dressing Details (indicate cue type and reason): Max A +2 sit<>stand Toilet Transfer: Maximal assistance, +2 for physical assistance, RW, Stand-pivot Toilet Transfer Details (indicate cue type and reason): Bed>recliner on pt's right ToiletingRunner, broadcasting/film/video and Hygiene: Total assistance Toileting - Clothing Manipulation Details (indicate cue type and reason): Max A +2 sit<>stand  Cognition: Cognition Overall Cognitive Status: Impaired/Different from baseline Orientation Level: Oriented to person, Oriented to place, Oriented to situation, Disoriented to time Cognition Arousal/Alertness: Awake/alert Behavior During Therapy: WFL for tasks assessed/performed Overall Cognitive Status: Impaired/Different from baseline Area of Impairment: Safety/judgement Safety/Judgement: Decreased awareness of safety(with transfers)  Blood pressure 116/64, pulse 86, temperature 98.6 F (37 C), temperature source Oral, resp. rate 18, height 5' 5.98" (1.676 m), weight 74.8 kg, SpO2 100 %.   Physical Exam  General: Alert and oriented x 3, No apparent distress HEENT: Head is normocephalic, atraumatic, PERRLA, EOMI, sclera anicteric, oral mucosa pink and moist, dentition intact, ext ear canals clear,  Neck: Supple without JVD or lymphadenopathy Heart: Reg rate and rhythm. No murmurs rubs or gallops Chest: CTA bilaterally without wheezes, rales, or rhonchi; no distress Abdomen: Soft, non-tender, non-distended, bowel sounds positive. Extremities: No clubbing, cyanosis, or edema. Pulses are 2+ Skin: Clean and intact without signs of breakdown Neuro/MSK: Alert, decreased safety awareness. Impaired memory. L AKA staples C/D/I. Right wounds on shin and 1st metatarsal head.   Psych: Belligerent at first, did not want to be disturbed, more agreeable later on.   Results for orders placed or performed during the hospital encounter of 12/23/19  (from the past 24 hour(s))  Basic metabolic panel     Status: Abnormal   Collection Time: 12/26/19  7:27 AM  Result Value Ref Range   Sodium 131 (L) 135 - 145 mmol/L   Potassium 3.9 3.5 - 5.1 mmol/L   Chloride 101 98 - 111 mmol/L   CO2 21 (L) 22 - 32 mmol/L   Glucose, Bld 145 (H) 70 - 99 mg/dL   BUN 6 6 - 20 mg/dL   Creatinine, Ser 0.70 0.61 - 1.24 mg/dL   Calcium 7.6 (L) 8.9 - 10.3 mg/dL   GFR calc non Af Amer >60 >60 mL/min   GFR calc Af Amer >60 >60 mL/min   Anion gap 9 5 - 15  CBC     Status: Abnormal   Collection Time: 12/26/19  7:27 AM  Result Value Ref Range   WBC 4.7 4.0 - 10.5 K/uL   RBC 2.19 (L) 4.22 - 5.81 MIL/uL   Hemoglobin 7.3 (L) 13.0 - 17.0 g/dL   HCT 21.7 (L) 39.0 - 52.0 %   MCV 99.1 80.0 - 100.0 fL   MCH 33.3 26.0 - 34.0 pg   MCHC 33.6 30.0 - 36.0 g/dL   RDW 17.2 (H) 11.5 - 15.5 %   Platelets 409 (H) 150 - 400 K/uL   nRBC 0.0 0.0 - 0.2 %   VAS [place] ABI WITH/WO TBI  Result Date: 12/26/2019 LOWER EXTREMITY DOPPLER STUDY Indications: Gangrene.  Vascular Interventions: Left AKA. Comparison Study: No prior study. Performing Technologist: Maudry Mayhew MHA, RVT, RDCS, RDMS  Examination Guidelines: A complete evaluation includes at minimum, Doppler waveform signals and systolic blood pressure reading at the level of bilateral brachial, anterior tibial, and posterior tibial arteries, when vessel segments are accessible. Bilateral testing is considered an integral part of a complete examination. Photoelectric Plethysmograph (PPG) waveforms and toe systolic pressure readings are included as required and additional duplex testing as needed. Limited examinations for reoccurring indications may be performed as noted.  ABI Findings: +--------+------------------+-----+-------------------+--------+ Right   Rt Pressure (mmHg)IndexWaveform           Comment  +--------+------------------+-----+-------------------+--------+ Brachial120                    triphasic                    +--------+------------------+-----+-------------------+--------+ PTA                            dampened monophasic         +--------+------------------+-----+-------------------+--------+ DP                             dampened monophasic         +--------+------------------+-----+-------------------+--------+ +--------+------------------+-----+--------+-------------+ Left    Lt Pressure (mmHg)IndexWaveformComment       +--------+------------------+-----+--------+-------------+ Brachial                               Not evaluated +--------+------------------+-----+--------+-------------+  Summary: Right: Abnormal right pedal waveforms suggestive of possible severe arterial insufficiency. Patient unable to tolerate blood pressure cuff, therefore unable to obtain right ABI.  *See table(s) above for measurements and observations.    Preliminary      Assessment/Plan: Diagnosis: Impaired mobility and ADLs 1. Does the need for close, 24 hr/day medical supervision in concert with the patient's rehab needs make it unreasonable for this patient to be served in a less intensive setting? Yes 2. Co-Morbidities requiring supervision/potential complications: lactic acidosis, alcohol abuse and withdrawal, macrocytic anemia, sacral decubitus ulcer, hyperglycemia, essential HTN, PAD, vitamin B12 deficiency, thrombocytosis, hypoglycemia  3. Due to bladder management, bowel management, safety, skin/wound care, disease management, medication administration, pain management and patient education, does the patient require 24 hr/day rehab nursing? Yes 4. Does the patient require coordinated care of a physician, rehab nurse, therapy disciplines of PT, OT, SLP to address physical and functional deficits in the context of the above medical diagnosis(es)? Yes Addressing deficits in the following areas: balance, endurance, locomotion, strength, transferring, bowel/bladder control, bathing,  dressing, feeding, grooming, toileting, cognition and psychosocial support 5. Can the patient actively participate in an intensive therapy program of at least 3 hrs of therapy per day at least 5 days per week? Yes 6. The potential for patient to make measurable gains while on inpatient rehab is good 7. Anticipated functional outcomes upon discharge from inpatient rehab are S with PT, S with OT, modified independent with SLP. 8. Estimated rehab length of stay to reach the above functional goals is: 10-14 days 9. Anticipated discharge destination: Home 10. Overall Rehab/Functional Prognosis: excellent and good  RECOMMENDATIONS: This patient's condition is appropriate for continued rehabilitative care in the following setting: CIR Patient has agreed to participate in recommended program. Yes Note that insurance prior authorization may be required for reimbursement for recommended  care.  Comment: Mr. Wissler would be a good CIR candidate pending family support. Requires education regarding medication compliance. Goal of CIR is to progress from +2A to S at W/C level or better. Vascular team currently determining whether arteriogram and right foot limb salvage are appropriate.   Izora Ribas, MD 12/26/2019

## 2019-12-27 LAB — FOLATE RBC
Folate, Hemolysate: 327 ng/mL
Folate, RBC: 1324 ng/mL (ref >498)
Hematocrit: 24.7 % — ABNORMAL LOW (ref 37.5–51.0)

## 2019-12-27 LAB — BASIC METABOLIC PANEL
Anion gap: 9 (ref 5–15)
BUN: 5 mg/dL — ABNORMAL LOW (ref 6–20)
CO2: 26 mmol/L (ref 22–32)
Calcium: 8.1 mg/dL — ABNORMAL LOW (ref 8.9–10.3)
Chloride: 99 mmol/L (ref 98–111)
Creatinine, Ser: 0.76 mg/dL (ref 0.61–1.24)
GFR calc Af Amer: >60 mL/min (ref >60)
GFR calc non Af Amer: >60 mL/min (ref >60)
Glucose, Bld: 102 mg/dL — ABNORMAL HIGH (ref 70–99)
Potassium: 3.9 mmol/L (ref 3.5–5.1)
Sodium: 134 mmol/L — ABNORMAL LOW (ref 135–145)

## 2019-12-27 LAB — CBC
HCT: 23.4 % — ABNORMAL LOW (ref 39.0–52.0)
Hemoglobin: 7.7 g/dL — ABNORMAL LOW (ref 13.0–17.0)
MCH: 32.8 pg (ref 26.0–34.0)
MCHC: 32.9 g/dL (ref 30.0–36.0)
MCV: 99.6 fL (ref 80.0–100.0)
Platelets: 495 10*3/uL — ABNORMAL HIGH (ref 150–400)
RBC: 2.35 MIL/uL — ABNORMAL LOW (ref 4.22–5.81)
RDW: 17.2 % — ABNORMAL HIGH (ref 11.5–15.5)
WBC: 5.3 10*3/uL (ref 4.0–10.5)
nRBC: 0 % (ref 0.0–0.2)

## 2019-12-27 NOTE — Progress Notes (Signed)
Pharmacy Antibiotic Note  Terrence Carr is a 58 y.o. male admitted on 12/23/2019 with osteomyelitis. Pharmacy has been consulted for vancomycin dosing.   Patient is s/p L AKA on 12/24. Today, afebrile and WBC wnl  Plan: Continue vancomycin 1 g IV every 12 hours (estAUC 476 using SCr 0.8)  Monitor renal function, culture results, clinical impression, and vancomycin levels as appropriate Follow up surgical plans   Height: 5' 5.98" (167.6 cm) Weight: 165 lb (74.8 kg) IBW/kg (Calculated) : 63.76  Temp (24hrs), Avg:98.5 F (36.9 C), Min:97.7 F (36.5 C), Max:99 F (37.2 C)  Recent Labs  Lab 12/23/19 1854 12/23/19 2124 12/24/19 0753 12/25/19 0359 12/26/19 0727 12/27/19 0535  WBC 7.1  --  7.1 5.3 4.7 5.3  CREATININE 0.62  --  0.71 0.70 0.70 0.76  LATICACIDVEN 5.4* 2.1*  --   --   --   --     Estimated Creatinine Clearance: 88.6 mL/min (by C-G formula based on SCr of 0.76 mg/dL).     Antimicrobials this admission: Vancomycin 12/23 >>  Cefepime 12/23 >> 12/26  Microbiology results: Resp PCR 12/23: negative 12/24 MRSA PCR: Positive   Brendolyn Patty, PharmD PGY2 Pharmacy Resident Phone [phone]  12/27/2019   1:27 PM

## 2019-12-27 NOTE — Progress Notes (Addendum)
  Progress Note    12/27/2019 8:14 AM 3 Days Post-Op  Subjective:  No complaints  Tm 99 now afebrile  Vitals:   12/27/19 0500 12/27/19 0742  BP: 138/65 136/67  Pulse: 71 85  Resp:  17  Temp: 97.7 F (36.5 C) 98.8 F (37.1 C)  SpO2: 100% 100%    Physical Exam: Incisions:  Clean and dry with staples in tact.    CBC    Component Value Date/Time   WBC 5.3 12/27/2019 0535   RBC 2.35 (L) 12/27/2019 0535   HGB 7.7 (L) 12/27/2019 0535   HCT 23.4 (L) 12/27/2019 0535   PLT 495 (H) 12/27/2019 0535   MCV 99.6 12/27/2019 0535   MCH 32.8 12/27/2019 0535   MCHC 32.9 12/27/2019 0535   RDW 17.2 (H) 12/27/2019 0535   LYMPHSABS 0.7 12/24/2019 0753   MONOABS 0.5 12/24/2019 0753   EOSABS 0.0 12/24/2019 0753   BASOSABS 0.0 12/24/2019 0753    BMET    Component Value Date/Time   NA 134 (L) 12/27/2019 0535   K 3.9 12/27/2019 0535   CL 99 12/27/2019 0535   CO2 26 12/27/2019 0535   GLUCOSE 102 (H) 12/27/2019 0535   BUN 5 (L) 12/27/2019 0535   CREATININE 0.76 12/27/2019 0535   CALCIUM 8.1 (L) 12/27/2019 0535   GFRNONAA >60 12/27/2019 0535   GFRAA >60 12/27/2019 0535    INR No results found for: INR   Intake/Output Summary (Last 24 hours) at 12/27/2019 0814 Last data filed at 12/27/2019 0500 Gross per 24 hour  Intake 720 ml  Output 750 ml  Net -30 ml     Assessment/Plan:  58 y.o. male is s/p left above knee amputation  3 Days Post-Op  -pt's stump looks good with staples in tact.   -will hold order for retention sock.  Pt's bed wet both days dressing has been changed with presumed urine.  Will need to keep close eye on this to help stump not get infected.     Leontine Locket, PA-C [facility] [phone] 12/27/2019 8:14 AM   I have seen and evaluated the patient. I agree with the PA note as documented above.  Left AKA continues to look okay.  I have posted him for right lower extremity arteriogram and possible intervention tomorrow as an  add-on if we can get to him.  He has very diminished waveforms at the ankle with noncompressible ABIs.  I do not think he has enough inflow to heal his wounds.  He previously had a right femoral endarterectomy and iliac intervention at [place], MD [facility] [place] Office: [phone] Pager: [phone]

## 2019-12-27 NOTE — Progress Notes (Signed)
[facility] Progress Note  Patient: Terrence Carr [id number]   PCP: Patient, No Pcp Per DOB: [birth year]   DOA: 12/23/2019   DOS: 12/27/2019   Date of Service: the patient was seen and examined on 12/27/2019  Chief Complaint  Patient presents with  . Wound Check   Brief hospital course: Patient is a 58 year old African-American male with past medical history significant for hypertension, peripheral vascular disease, tobacco abuse that is continuous, alcohol abuse, peripheral neuropathy, B12 deficiency and severe protein calorie malnutrition.  Patient presented  with bilateral leg wound and pain progressively worsening over last 3 weeks, was found to have extensive gangrene of left foot.  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  Vascular surgery plans arteriogram of the right lower extremity tomorrow.    Subjective:  -No new complaints.   -No fever or chills.   -No shortness of breath.    Assessment and Plan: Scheduled Meds: . enoxaparin (LOVENOX) injection  40 mg Subcutaneous Q24H  . feeding supplement (ENSURE ENLIVE)  237 mL Oral BID BM  . gabapentin  100 mg Oral TID  . LORazepam  0-4 mg Intravenous Q12H   Or  . LORazepam  0-4 mg Oral Q12H  . multivitamin  1 tablet Oral Daily  . mupirocin ointment  1 application Topical BID  . nutrition supplement (JUVEN)  1 packet Oral BID BM  . thiamine  100 mg Oral Daily  . vitamin B-12  1,000 mcg Oral Daily   Continuous Infusions: . vancomycin 1,000 mg (12/27/19 1000)   PRN Meds: acetaminophen **OR** acetaminophen, oxyCODONE-acetaminophen  1.  Peripheral vascular disease Critical limb ischemia bilaterally Left foot gangrene. Osteomyelitis SP left AKA Appreciate vascular surgery assistance. Patient underwent left AKA. Tolerated procedure very well. Suspecting that the patient will also require right leg amputation but currently not urgent at this time.. Patient had significant lactic acidosis at the time of admission  requiring IV hydration suspect that is from hypoperfusion. Given his presentation, will continue with the antibiotics for now  will need to discuss with ID before that given the evidence of osteomyelitis on right leg which is still not operated on. 12/27/2019: Patient remained stable postop.  Arteriogram of the right lower extremity is planned for tomorrow.  Vascular surgery team is directing postop care.  2.  Alcohol abuse Alcohol withdrawal Continue CIWA protocol. Continue thiamine.  3.  Macrocytic anemia Severe B12 deficiency. B12 injection subcutaneously x1 followed by oral B12 daily. Also continue oral folic acid.  4.  Reactive thrombocytosis. -Continue to monitor closely. -Platelet count today is 495.  5.  Severe protein calorie malnutrition. Likely from alcohol abuse. Dietary consultation. At risk for poor wound healing. Also at risk for third spacing from hypoalbuminemia. Monitor.  6.  Left buttock pressure ulcer stage II POA Continue foam dressing.  Pressure Injury 12/23/19 Buttocks Left;Right;Posterior;Lower;Upper;Mid Stage 2 -  Partial thickness loss of dermis presenting as a shallow open injury with a red, pink wound bed without slough. multiple pressure injuries to buttocks.  (Active)  12/23/19 2102  Location: Buttocks  Location Orientation: Left;Right;Posterior;Lower;Upper;Mid  Staging: Stage 2 -  Partial thickness loss of dermis presenting as a shallow open injury with a red, pink wound bed without slough.  Wound Description (Comments): multiple pressure injuries to buttocks.   Present on Admission: Yes    Diet: Cardiac diet and Regular diet  DVT Prophylaxis: Subcutaneous Lovenox   Advance goals of care discussion: Full code  Family Communication: no family was present at bedside, at  the time of interview.  Disposition:  Discharge to be determined.  Consultants: Vascular surgery Procedures: Left AKA  Antibiotics: Anti-infectives (From admission,  onward)   Start     Dose/Rate Route Frequency Ordered Stop   12/24/19 0800  vancomycin (VANCOCIN) IVPB 1000 mg/200 mL premix     1,000 mg 200 mL/hr over 60 Minutes Intravenous Every 12 hours 12/23/19 1950     12/24/19 0500  ceFEPIme (MAXIPIME) 2 g in sodium chloride 0.9 % 100 mL IVPB  Status:  Discontinued     2 g 200 mL/hr over 30 Minutes Intravenous Every 8 hours 12/24/19 0017 12/26/19 0750   12/23/19 2045  ceFEPIme (MAXIPIME) 2 g in sodium chloride 0.9 % 100 mL IVPB     2 g 200 mL/hr over 30 Minutes Intravenous  Once 12/23/19 2036 12/23/19 2148   12/23/19 1930  ceFAZolin (ANCEF) IVPB 1 g/50 mL premix  Status:  Discontinued     2 g 200 mL/hr over 30 Minutes Intravenous  Once 12/23/19 1918 12/23/19 2036   12/23/19 1900  vancomycin (VANCOREADY) IVPB 1500 mg/300 mL     1,500 mg 150 mL/hr over 120 Minutes Intravenous  Once 12/23/19 1857 12/23/19 2231       Objective: Physical Exam: Vitals:   12/26/19 1926 12/27/19 0500 12/27/19 0742 12/27/19 1535  BP: 116/64 138/65 136/67 127/68  Pulse: 77 71 85 77  Resp:   17 15  Temp: 99 F (37.2 C) 97.7 F (36.5 C) 98.8 F (37.1 C) 98.7 F (37.1 C)  TempSrc: Oral Oral Oral Oral  SpO2: 100% 100% 100%   Weight:      Height:        Intake/Output Summary (Last 24 hours) at 12/27/2019 1640 Last data filed at 12/27/2019 1500 Gross per 24 hour  Intake 480 ml  Output 375 ml  Net 105 ml   Filed Weights   12/23/19 1855 12/24/19 1008  Weight: 74.8 kg 74.8 kg   General: Patient is cachectic.  Patient is not in any obvious distress.   Eyes: Pallor.  Neck: no JVD Cardiovascular: S1 and S2  Respiratory: Clear to auscultation  abdomen: Bowel Sound present, Soft and no tenderness, no hernia Extremities: Status post left AKA.  Right lower extremity wounds are dressed.   Neurologic:  Awake and alert.  Patient moves all extremities.  CBC: Recent Labs  Lab 12/23/19 1854 12/24/19 0419 12/24/19 0753 12/25/19 0359 12/26/19 0727  12/27/19 0535  WBC 7.1  --  7.1 5.3 4.7 5.3  NEUTROABS 5.3  --  5.9  --   --   --   HGB 9.7*  --  8.3* 7.4* 7.3* 7.7*  HCT 29.8* 24.7* 24.5* 21.6* 21.7* 23.4*  MCV 101.4*  --  97.2 96.4 99.1 99.6  PLT 533*  --  467* 391 409* [id number]*   Basic Metabolic Panel: Recent Labs  Lab 12/23/19 1854 12/24/19 0419 12/24/19 0753 12/25/19 0359 12/26/19 0727 12/27/19 0535  NA 132*  --  130* 134* 131* 134*  K 3.7  --  3.9 3.9 3.9 3.9  CL 95*  --  97* 101 101 99  CO2 16*  --  22 24 21* 26  GLUCOSE 59*  --  100* 100* 145* 102*  BUN 8  --  8 6 6  5*  CREATININE 0.62  --  0.71 0.70 0.70 0.76  CALCIUM 8.5*  --  7.9* 7.8* 7.6* 8.1*  MG  --  1.9 2.0 1.9  --   --  PHOS  --  3.2  --   --   --   --     Liver Function Tests: Recent Labs  Lab 12/24/19 0753  AST 49*  ALT 21  ALKPHOS 189*  BILITOT 1.0  PROT 5.3*  ALBUMIN 1.6*   No results for input(s): LIPASE, AMYLASE in the last 168 hours. No results for input(s): AMMONIA in the last 168 hours. Coagulation Profile: No results for input(s): INR, PROTIME in the last 168 hours. Cardiac Enzymes: No results for input(s): CKTOTAL, CKMB, CKMBINDEX, TROPONINI in the last 168 hours. BNP (last 3 results) No results for input(s): PROBNP in the last 8760 hours. CBG: Recent Labs  Lab 12/23/19 2132 12/23/19 2312 12/24/19 0640  GLUCAP 62* 128* 89   Studies: No results found.   Time spent: 25 minutes  Author: Dana Allan, MD [facility] 12/27/2019 4:40 PM  To reach On-call, see care teams to locate the attending and reach out to them via [email or web address]. If 7PM-7AM, please contact night-coverage If you still have difficulty reaching the attending provider, please page the [facility] (Director on Call) for [facility] on amion for assistance.

## 2019-12-28 ENCOUNTER — Inpatient Hospital Stay (HOSPITAL_COMMUNITY): Payer: Medicaid Other

## 2019-12-28 ENCOUNTER — Encounter (HOSPITAL_COMMUNITY)
Admission: EM | Disposition: A | Discharge status: Rehabilitation Facility | Payer: Self-pay | Source: Home / Self Care | Attending: Internal Medicine

## 2019-12-28 DIAGNOSIS — I739 Peripheral vascular disease, unspecified: Secondary | ICD-10-CM

## 2019-12-28 HISTORY — PX: LOWER EXTREMITY ANGIOGRAPHY: CATH118251

## 2019-12-28 LAB — CBC
HCT: 23 % — ABNORMAL LOW (ref 39.0–52.0)
Hemoglobin: 7.6 g/dL — ABNORMAL LOW (ref 13.0–17.0)
MCH: 32.9 pg (ref 26.0–34.0)
MCHC: 33 g/dL (ref 30.0–36.0)
MCV: 99.6 fL (ref 80.0–100.0)
Platelets: 452 10*3/uL — ABNORMAL HIGH (ref 150–400)
RBC: 2.31 MIL/uL — ABNORMAL LOW (ref 4.22–5.81)
RDW: 17 % — ABNORMAL HIGH (ref 11.5–15.5)
WBC: 5.2 10*3/uL (ref 4.0–10.5)
nRBC: 0 % (ref 0.0–0.2)

## 2019-12-28 LAB — POCT ACTIVATED CLOTTING TIME: Activated Clotting Time: 125 seconds

## 2019-12-28 LAB — PROTIME-INR
INR: 1 (ref 0.8–1.2)
Prothrombin Time: 12.9 seconds (ref 11.4–15.2)

## 2019-12-28 LAB — BASIC METABOLIC PANEL
Anion gap: 7 (ref 5–15)
BUN: 6 mg/dL (ref 6–20)
CO2: 27 mmol/L (ref 22–32)
Calcium: 8.1 mg/dL — ABNORMAL LOW (ref 8.9–10.3)
Chloride: 98 mmol/L (ref 98–111)
Creatinine, Ser: 0.63 mg/dL (ref 0.61–1.24)
GFR calc Af Amer: >60 mL/min (ref >60)
GFR calc non Af Amer: >60 mL/min (ref >60)
Glucose, Bld: 103 mg/dL — ABNORMAL HIGH (ref 70–99)
Potassium: 4.2 mmol/L (ref 3.5–5.1)
Sodium: 132 mmol/L — ABNORMAL LOW (ref 135–145)

## 2019-12-28 SURGERY — LOWER EXTREMITY ANGIOGRAPHY
Anesthesia: LOCAL | Laterality: Right

## 2019-12-28 MED ORDER — SODIUM CHLORIDE 0.9 % IV SOLN: INTRAVENOUS | Status: AC

## 2019-12-28 MED ORDER — HYDRALAZINE HCL 20 MG/ML IJ SOLN: 5.0000 mg | INTRAMUSCULAR | Status: DC | PRN

## 2019-12-28 MED ORDER — SODIUM CHLORIDE 0.9% FLUSH
3.0000 mL | Freq: Two times a day (BID) | INTRAVENOUS | Status: DC
  Administered 2019-12-28 – 2019-12-29 (×3): 3 mL via INTRAVENOUS

## 2019-12-28 MED ORDER — FENTANYL CITRATE (PF) 100 MCG/2ML IJ SOLN
INTRAMUSCULAR | Status: AC
  Filled 2019-12-28: qty 2

## 2019-12-28 MED ORDER — LIDOCAINE HCL (PF) 1 % IJ SOLN
INTRAMUSCULAR | Status: AC
  Filled 2019-12-28: qty 30

## 2019-12-28 MED ORDER — MIDAZOLAM HCL 2 MG/2ML IJ SOLN
INTRAMUSCULAR | Status: DC | PRN
  Administered 2019-12-28: 1 mg via INTRAVENOUS

## 2019-12-28 MED ORDER — SODIUM CHLORIDE 0.9 % IV SOLN: INTRAVENOUS | Status: DC

## 2019-12-28 MED ORDER — MORPHINE SULFATE (PF) 2 MG/ML IV SOLN
2.0000 mg | INTRAVENOUS | Status: DC | PRN
  Administered 2019-12-29: 2 mg via INTRAVENOUS
  Filled 2019-12-28: qty 1

## 2019-12-28 MED ORDER — ASPIRIN EC 81 MG PO TBEC
81.0000 mg | DELAYED_RELEASE_TABLET | Freq: Every day | ORAL | Status: DC
  Administered 2019-12-28 – 2019-12-31 (×3): 81 mg via ORAL
  Filled 2019-12-28 (×3): qty 1

## 2019-12-28 MED ORDER — OXYCODONE HCL 5 MG PO TABS
5.0000 mg | ORAL_TABLET | ORAL | Status: DC | PRN
  Administered 2019-12-30 – 2019-12-31 (×4): 10 mg via ORAL
  Filled 2019-12-28 (×4): qty 2

## 2019-12-28 MED ORDER — HEPARIN SODIUM (PORCINE) 5000 UNIT/ML IJ SOLN
5000.0000 [IU] | Freq: Three times a day (TID) | INTRAMUSCULAR | Status: DC
  Administered 2019-12-28 – 2019-12-30 (×5): 5000 [IU] via SUBCUTANEOUS
  Filled 2019-12-28 (×5): qty 1

## 2019-12-28 MED ORDER — SODIUM CHLORIDE 0.9 % IV SOLN: 250.0000 mL | INTRAVENOUS | Status: DC | PRN

## 2019-12-28 MED ORDER — MIDAZOLAM HCL 2 MG/2ML IJ SOLN
INTRAMUSCULAR | Status: AC
  Filled 2019-12-28: qty 2

## 2019-12-28 MED ORDER — ONDANSETRON HCL 4 MG/2ML IJ SOLN
4.0000 mg | Freq: Four times a day (QID) | INTRAMUSCULAR | Status: DC | PRN
  Filled 2019-12-28: qty 2

## 2019-12-28 MED ORDER — ACETAMINOPHEN 325 MG PO TABS
650.0000 mg | ORAL_TABLET | ORAL | Status: DC | PRN
  Administered 2019-12-28 – 2019-12-29 (×3): 650 mg via ORAL
  Filled 2019-12-28 (×3): qty 2

## 2019-12-28 MED ORDER — LIDOCAINE HCL (PF) 1 % IJ SOLN
INTRAMUSCULAR | Status: DC | PRN
  Administered 2019-12-28: 18 mL via INTRADERMAL

## 2019-12-28 MED ORDER — FENTANYL CITRATE (PF) 100 MCG/2ML IJ SOLN
INTRAMUSCULAR | Status: DC | PRN
  Administered 2019-12-28: 25 ug via INTRAVENOUS

## 2019-12-28 MED ORDER — SODIUM CHLORIDE 0.9% FLUSH: 3.0000 mL | INTRAVENOUS | Status: DC | PRN

## 2019-12-28 MED ORDER — LABETALOL HCL 5 MG/ML IV SOLN: 10.0000 mg | INTRAVENOUS | Status: DC | PRN

## 2019-12-28 MED ORDER — HEPARIN (PORCINE) IN NACL 1000-0.9 UT/500ML-% IV SOLN
INTRAVENOUS | Status: DC | PRN
  Administered 2019-12-28 (×2): 500 mL

## 2019-12-28 MED ORDER — IODIXANOL 320 MG/ML IV SOLN
INTRAVENOUS | Status: DC | PRN
  Administered 2019-12-28: 65 mL via INTRA_ARTERIAL

## 2019-12-28 SURGICAL SUPPLY — 9 items
CATH OMNI FLUSH 5F 65CM (CATHETERS) ×1 IMPLANT
KIT MICROPUNCTURE NIT STIFF (SHEATH) ×1 IMPLANT
KIT PV (KITS) ×2 IMPLANT
SHEATH PINNACLE 5F 10CM (SHEATH) ×1 IMPLANT
SHEATH PROBE COVER 6X72 (BAG) ×1 IMPLANT
SYR MEDRAD MARK V 150ML (SYRINGE) ×1 IMPLANT
TRANSDUCER W/STOPCOCK (MISCELLANEOUS) ×2 IMPLANT
TRAY PV CATH (CUSTOM PROCEDURE TRAY) ×2 IMPLANT
WIRE BENTSON .035X145CM (WIRE) ×1 IMPLANT

## 2019-12-28 NOTE — Progress Notes (Signed)
  Progress Note    12/28/2019 10:22 AM Day of Surgery  Subjective: No overnight issues.  Vitals:   12/28/19 0503 12/28/19 0852  BP: 117/63 113/61  Pulse: 67 63  Resp:  16  Temp: 98.4 F (36.9 C) 98 F (36.7 C)  SpO2: 100% 100%    Physical Exam: Awake alert oriented Right groin well-healed Left AKA healing well  CBC    Component Value Date/Time   WBC 5.2 12/28/2019 0447   RBC 2.31 (L) 12/28/2019 0447   HGB 7.6 (L) 12/28/2019 0447   HCT 23.0 (L) 12/28/2019 0447   HCT 24.7 (L) 12/24/2019 0419   PLT 452 (H) 12/28/2019 0447   MCV 99.6 12/28/2019 0447   MCH 32.9 12/28/2019 0447   MCHC 33.0 12/28/2019 0447   RDW 17.0 (H) 12/28/2019 0447   LYMPHSABS 0.7 12/24/2019 0753   MONOABS 0.5 12/24/2019 0753   EOSABS 0.0 12/24/2019 0753   BASOSABS 0.0 12/24/2019 0753    BMET    Component Value Date/Time   NA 132 (L) 12/28/2019 0447   K 4.2 12/28/2019 0447   CL 98 12/28/2019 0447   CO2 27 12/28/2019 0447   GLUCOSE 103 (H) 12/28/2019 0447   BUN 6 12/28/2019 0447   CREATININE 0.63 12/28/2019 0447   CALCIUM 8.1 (L) 12/28/2019 0447   GFRNONAA >60 12/28/2019 0447   GFRAA >60 12/28/2019 0447    INR    Component Value Date/Time   INR 1.0 12/28/2019 0447     Intake/Output Summary (Last 24 hours) at 12/28/2019 1022 Last data filed at 12/27/2019 1700 Gross per 24 hour  Intake 360 ml  Output 200 ml  Net 160 ml     Assessment/plan:  58 y.o. male is s/p left AKA now with critical limb ischemia on the right plan for right lower extremity angiography today.    Kolbey Teichert C. Donzetta Matters, MD [facility] [place] Office: [phone] Pager: [phone]  12/28/2019 10:22 AM

## 2019-12-28 NOTE — Progress Notes (Signed)
Physical Therapy Treatment Patient Details Name: Terrence Carr MRN: [id number] DOB: [birth year] Today's Date: 12/28/2019    History of Present Illness Terrence Carr is a 58 y.o. male with medical history significant for hypertension, peripheral arterial occlusive disease, tobacco use disorder, alcohol abuse, peripheral neuropathy, vitamin B12 deficiency and history of severe protein calorie malnutrition who presents to the emergency department due to 3-week history of progressive and worsening wounds to bilateral lower extremities. S/P LAKA 12/24.    PT Comments    Pt performed BLE exercises in supine and sitting EOB. Transport arrived during session to take pt for arteriogram RLE. Pt assisted back to bed and then with lateral slide transfer to stretcher.   Follow Up Recommendations  CIR     Equipment Recommendations  Other (comment)(TBD at next venue)    Recommendations for Other Services       Precautions / Restrictions Precautions Precautions: Fall Restrictions LLE Weight Bearing: Non weight bearing    Mobility  Bed Mobility Overal bed mobility: Needs Assistance Bed Mobility: Supine to Sit     Supine to sit: Mod assist;HOB elevated     General bed mobility comments: +rail, increased time, cues for sequencing, use of bed pad to scoot to EOB  Transfers                    Ambulation/Gait                 Stairs             Wheelchair Mobility    Modified Rankin (Stroke Patients Only)       Balance Overall balance assessment: Needs assistance Sitting-balance support: Bilateral upper extremity supported;Feet unsupported Sitting balance-Leahy Scale: Fair Sitting balance - Comments: Pt sat EOB x 5 minutes min guard assist                                    Cognition Arousal/Alertness: Awake/alert Behavior During Therapy: WFL for tasks assessed/performed Overall Cognitive Status: Impaired/Different from baseline Area  of Impairment: Safety/judgement                         Safety/Judgement: Decreased awareness of safety            Exercises General Exercises - Lower Extremity Ankle Circles/Pumps: AROM;Right;10 reps;Supine Gluteal Sets: AROM;Both;10 reps;Supine Long Arc Quad: AROM;Right;10 reps;Seated Heel Slides: AROM;Right;15 reps;Supine Hip ABduction/ADduction: AROM;Right;Left;15 reps;Supine    General Comments General comments (skin integrity, edema, etc.): RLE very tender to touch      Pertinent Vitals/Pain Pain Assessment: Faces Faces Pain Scale: Hurts whole lot Pain Location: bilat LE Pain Descriptors / Indicators: Grimacing;Guarding;Tender;Sore Pain Intervention(s): Repositioned;Limited activity within patient's tolerance;Monitored during session    Home Living                      Prior Function            PT Goals (current goals can now be found in the care plan section) Acute Rehab PT Goals Patient Stated Goal: decrease pain Progress towards PT goals: Progressing toward goals    Frequency    Min 3X/week      PT Plan Current plan remains appropriate    Co-evaluation              AM-PAC PT "6 Clicks" Mobility   Outcome Measure  Help  needed turning from your back to your side while in a flat bed without using bedrails?: None Help needed moving from lying on your back to sitting on the side of a flat bed without using bedrails?: A Lot Help needed moving to and from a bed to a chair (including a wheelchair)?: Total Help needed standing up from a chair using your arms (e.g., wheelchair or bedside chair)?: Total Help needed to walk in hospital room?: Total Help needed climbing 3-5 steps with a railing? : Total 6 Click Score: 10    End of Session   Activity Tolerance: Patient tolerated treatment well Patient left: Other (comment)(assisted to stretcher for transport to procedure) Nurse Communication: Mobility status PT Visit Diagnosis:  Unsteadiness on feet (R26.81);Other abnormalities of gait and mobility (R26.89);Muscle weakness (generalized) (M62.81);Difficulty in walking, not elsewhere classified (R26.2);Pain Pain - part of body: Leg     Time: 0911-0928 PT Time Calculation (min) (ACUTE ONLY): 17 min  Charges:  $Therapeutic Exercise: 8-22 mins                     Lorrin Goodell, PT  Office # [phone] Pager [phone]    Lorriane Shire 12/28/2019, 9:43 AM

## 2019-12-28 NOTE — Op Note (Signed)
    Patient name: Terrence Carr MRN: [id number] DOB: 1961/02/06 Sex: male  12/28/2019 Pre-operative Diagnosis: Critical right lower extremity ischemia with wounds Post-operative diagnosis:  Same Surgeon:  Erlene Quan C. Donzetta Matters, MD Procedure Performed: 1.  Ultrasound-guided cannulation left common femoral artery 2.  Aortogram 3.  Selection of right common femoral artery and right lower extremity angiography 4.  Moderate sedation with fentanyl and Versed for 22 minutes   Indications: 58 year old male presented with bilateral lower extremity ischemia.  He is undergone left above-knee amputation.  He is now indicated for right lower extremity angiography possible intervention.  Findings: Aorta and iliac segments are free of flow-limiting stenosis.  The right side common iliac artery has a stent that appears patent.  Common femoral artery has patent patch.  The SFA is flush occluded.  He reconstitutes above the knee popliteal artery which is diseased to below the knee.  Does have three-vessel runoff to the foot.   He will be considered for right femoral to popliteal artery bypass.   Procedure:  The patient was identified in the holding area and taken to room 8.  The patient was then placed supine on the table and prepped and draped in the usual sterile fashion.  A time out was called.  Ultrasound was used to evaluate the left common femoral artery.  There is anesthetized 1% lidocaine cannulated with direct ultrasound visualization a micropuncture needle followed by wire sheath.  Bentson wire was placed.  And images saved the permanent record of the ultrasound-guided cannulation site.  5 French sheath was placed.  Omni catheter placed to level L1 aortogram performed.  We crossed bifurcation perform right lower extremity angiography.  With above findings patient will need bypass surgery.  He remains high risk for amputation.  Catheter was removed over wire.  Sheath will be pulled in postoperative holding.   He tolerated procedure well without any complication   Contrast: 65 cc  [name] C. Donzetta Matters, MD [facility] Office: [phone] Pager: [phone]

## 2019-12-28 NOTE — Progress Notes (Signed)
Inpatient Rehabilitation Admissions Coordinator  Noted arteriogram results. I await further plans to assist with dispo.

## 2019-12-28 NOTE — Plan of Care (Signed)
  Problem: Activity: Goal: Risk for activity intolerance will decrease Outcome: Progressing   Problem: Coping: Goal: Level of anxiety will decrease Outcome: Progressing   Problem: Pain Managment: Goal: General experience of comfort will improve Outcome: Progressing   Problem: Skin Integrity: Goal: Risk for impaired skin integrity will decrease Outcome: Progressing   

## 2019-12-28 NOTE — Progress Notes (Signed)
Site area: Left  Groin a 5 french arterial sheath was removed  Site Prior to Removal:  Level 0  Pressure Applied For 20 MINUTES    Bedrest Beginning at 1130am X 4 hours  Manual:   Yes.    Patient Status During Pull:  stable  Post Pull Groin Site:  Level 0  Post Pull Instructions Given:  Yes.    Post Pull Pulses Present:  Yes.    Dressing Applied:  Yes.    Comments:

## 2019-12-28 NOTE — Progress Notes (Signed)
Patient has foam dressings to sacral area and buttocks. Applied barrier cream to peri area ulcers. Patient has foam dressing to right anterior lower leg and right foot/great toe. Wounds are malodorous and sticking to foam dressing. Will need wound consuLt for care.

## 2019-12-28 NOTE — Progress Notes (Signed)
PROGRESS NOTE    Terrence Carr  [id number] DOB: [birth year] DOA: 12/23/2019 PCP: Patient, No Pcp Per   Brief Narrative:  Patient is a 58 year old African-American male with past medical history significant for hypertension, peripheral vascular disease, tobacco abuse that is continuous, alcohol abuse, peripheral neuropathy, B12 deficiency and severe protein calorie malnutrition.  Patient presented  with bilateral leg wound and pain progressively worsening over last 3 weeks, was found to have extensive gangrene of left foot.  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  Vascular surgery plans arteriogram of the right lower extremity tomorrow.    12/28: No acute overnight events noted per nursing staff.  Plans for arteriogram of right lower extremity per vascular surgery today.  Assessment & Plan:   Active Problems:   Osteomyelitis of ankle or foot, acute, right (HCC)   Lactic acidosis   Alcohol abuse   Alcohol withdrawal (HCC)   Macrocytic anemia   Sacral decubitus ulcer   Hyperglycemia   Essential hypertension   Peripheral arterial disease (HCC)   Vitamin B12 deficiency   Thrombocytosis (HCC)   Hypoglycemia   1.  Peripheral vascular disease Critical limb ischemia bilaterally Left foot gangrene. Osteomyelitis SP left AKA Appreciate vascular surgery assistance. Patient underwent left AKA. Tolerated procedure very well. Suspecting that the patient will also require right leg amputation but currently not urgent at this time.. Patient had significant lactic acidosis at the time of admission requiring IV hydration suspect that is from hypoperfusion. Given his presentation, will continue with the antibiotics for now  will need to discuss with ID before that given the evidence of osteomyelitis on right leg which is still not operated on. 12/28/2019: Patient has remained stable postop.  Repeat arteriogram of right lower extremity is planned today.  Vascular surgery is directing  perioperative care.  2.  Alcohol abuse Alcohol withdrawal Continue CIWA protocol. Continue thiamine. -No signs of withdrawal noted  3.  Macrocytic anemia Severe B12 deficiency. B12 injection subcutaneously x1 followed by oral B12 daily. Also continue oral folic acid.  4.  Reactive thrombocytosis. -Continue to monitor closely. -Platelet count today is 452  5.  Severe protein calorie malnutrition. Likely from alcohol abuse. Dietary consultation. At risk for poor wound healing. Also at risk for third spacing from hypoalbuminemia. Monitor.  6.  Left buttock pressure ulcer stage II POA Continue foam dressing.  Pressure Injury 12/23/19 Buttocks Left;Right;Posterior;Lower;Upper;Mid Stage 2 -  Partial thickness loss of dermis presenting as a shallow open injury with a red, pink wound bed without slough. multiple pressure injuries to buttocks.  (Active)  12/23/19 2102  Location: Buttocks  Location Orientation: Left;Right;Posterior;Lower;Upper;Mid  Staging: Stage 2 -  Partial thickness loss of dermis presenting as a shallow open injury with a red, pink wound bed without slough.  Wound Description (Comments): multiple pressure injuries to buttocks.   Present on Admission: Yes      DVT prophylaxis: Lovenox Code Status: Full Family Communication: None at bedside Disposition Plan: Plan for right lower extremity arteriogram per vascular surgery today.   Consultants:   Vascular surgery  Procedures:   Left AKA  Plans for right lower extremity arteriogram 12/28  Antimicrobials:  Anti-infectives (From admission, onward)   Start     Dose/Rate Route Frequency Ordered Stop   12/24/19 0800  vancomycin (VANCOCIN) IVPB 1000 mg/200 mL premix     1,000 mg 200 mL/hr over 60 Minutes Intravenous Every 12 hours 12/23/19 1950     12/24/19 0500  ceFEPIme (MAXIPIME) 2 g in sodium  chloride 0.9 % 100 mL IVPB  Status:  Discontinued     2 g 200 mL/hr over 30 Minutes Intravenous Every 8  hours 12/24/19 0017 12/26/19 0750   12/23/19 2045  ceFEPIme (MAXIPIME) 2 g in sodium chloride 0.9 % 100 mL IVPB     2 g 200 mL/hr over 30 Minutes Intravenous  Once 12/23/19 2036 12/23/19 2148   12/23/19 1930  ceFAZolin (ANCEF) IVPB 1 g/50 mL premix  Status:  Discontinued     2 g 200 mL/hr over 30 Minutes Intravenous  Once 12/23/19 1918 12/23/19 2036   12/23/19 1900  vancomycin (VANCOREADY) IVPB 1500 mg/300 mL     1,500 mg 150 mL/hr over 120 Minutes Intravenous  Once 12/23/19 1857 12/23/19 2231       Subjective: Patient seen and evaluated today with no new acute complaints or concerns. No acute concerns or events noted overnight.  Objective: Vitals:   12/27/19 1535 12/27/19 2028 12/28/19 0500 12/28/19 0503  BP: 127/68 (!) 111/58  117/63  Pulse: 77 75  67  Resp: 15     Temp: 98.7 F (37.1 C) 98.2 F (36.8 C)  98.4 F (36.9 C)  TempSrc: Oral Oral  Oral  SpO2:  100%  100%  Weight:   55.1 kg   Height:        Intake/Output Summary (Last 24 hours) at 12/28/2019 0834 Last data filed at 12/27/2019 1700 Gross per 24 hour  Intake 480 ml  Output 325 ml  Net 155 ml   Filed Weights   12/23/19 1855 12/24/19 1008 12/28/19 0500  Weight: 74.8 kg 74.8 kg 55.1 kg    Examination:  General exam: Appears calm and comfortable  Respiratory system: Clear to auscultation. Respiratory effort normal.  On room air. Cardiovascular system: S1 & S2 heard, RRR. No JVD, murmurs, rubs, gallops or clicks. No pedal edema. Gastrointestinal system: Abdomen is nondistended, soft and nontender. No organomegaly or masses felt. Normal bowel sounds heard. Central nervous system: Alert and awake Extremities: Left AKA with staples.  Incision clean dry and intact with no drainage.  Right lower extremity with wounds on shin and right great toe area with necrotic base. Skin: No rashes, lesions or ulcers Psychiatry: Difficult to assess.    Data Reviewed: I have personally reviewed following labs and imaging  studies  CBC: Recent Labs  Lab 12/23/19 1854 12/24/19 0753 12/25/19 0359 12/26/19 0727 12/27/19 0535 12/28/19 0447  WBC 7.1 7.1 5.3 4.7 5.3 5.2  NEUTROABS 5.3 5.9  --   --   --   --   HGB 9.7* 8.3* 7.4* 7.3* 7.7* 7.6*  HCT 29.8* 24.5* 21.6* 21.7* 23.4* 23.0*  MCV 101.4* 97.2 96.4 99.1 99.6 99.6  PLT 533* 467* 391 409* 495* [id number]*   Basic Metabolic Panel: Recent Labs  Lab 12/24/19 0419 12/24/19 0753 12/25/19 0359 12/26/19 0727 12/27/19 0535 12/28/19 0447  NA  --  130* 134* 131* 134* 132*  K  --  3.9 3.9 3.9 3.9 4.2  CL  --  97* 101 101 99 98  CO2  --  22 24 21* 26 27  GLUCOSE  --  100* 100* 145* 102* 103*  BUN  --  8 6 6  5* 6  CREATININE  --  0.71 0.70 0.70 0.76 0.63  CALCIUM  --  7.9* 7.8* 7.6* 8.1* 8.1*  MG 1.9 2.0 1.9  --   --   --   PHOS 3.2  --   --   --   --   --  GFR: Estimated Creatinine Clearance: 76.5 mL/min (by C-G formula based on SCr of 0.63 mg/dL). Liver Function Tests: Recent Labs  Lab 12/24/19 0753  AST 49*  ALT 21  ALKPHOS 189*  BILITOT 1.0  PROT 5.3*  ALBUMIN 1.6*   No results for input(s): LIPASE, AMYLASE in the last 168 hours. No results for input(s): AMMONIA in the last 168 hours. Coagulation Profile: Recent Labs  Lab 12/28/19 0447  INR 1.0   Cardiac Enzymes: No results for input(s): CKTOTAL, CKMB, CKMBINDEX, TROPONINI in the last 168 hours. BNP (last 3 results) No results for input(s): PROBNP in the last 8760 hours. HbA1C: No results for input(s): HGBA1C in the last 72 hours. CBG: Recent Labs  Lab 12/23/19 2132 12/23/19 2312 12/24/19 0640  GLUCAP 62* 128* 89   Lipid Profile: No results for input(s): CHOL, HDL, LDLCALC, TRIG, CHOLHDL, LDLDIRECT in the last 72 hours. Thyroid Function Tests: No results for input(s): TSH, T4TOTAL, FREET4, T3FREE, THYROIDAB in the last 72 hours. Anemia Panel: No results for input(s): VITAMINB12, FOLATE, FERRITIN, TIBC, IRON, RETICCTPCT in the last 72 hours. Sepsis Labs: Recent Labs  Lab  12/23/19 1854 12/23/19 2124  LATICACIDVEN 5.4* 2.1*    Recent Results (from the past 240 hour(s))  Respiratory Panel by RT PCR (Flu A&B, Covid) - Nasopharyngeal Swab     Status: None   Collection Time: 12/23/19  8:55 PM   Specimen: Nasopharyngeal Swab  Result Value Ref Range Status   SARS Coronavirus 2 by RT PCR NEGATIVE NEGATIVE Final    Comment: (NOTE) SARS-CoV-2 target nucleic acids are NOT DETECTED. The SARS-CoV-2 RNA is generally detectable in upper respiratoy specimens during the acute phase of infection. The lowest concentration of SARS-CoV-2 viral copies this assay can detect is 131 copies/mL. A negative result does not preclude SARS-Cov-2 infection and should not be used as the sole basis for treatment or other patient management decisions. A negative result may occur with  improper specimen collection/handling, submission of specimen other than nasopharyngeal swab, presence of viral mutation(s) within the areas targeted by this assay, and inadequate number of viral copies (<131 copies/mL). A negative result must be combined with clinical observations, patient history, and epidemiological information. The expected result is Negative. Fact Sheet for Patients:  PinkCheek.be Fact Sheet for Healthcare Providers:  GravelBags.it This test is not yet ap proved or cleared by the [place] FDA and  has been authorized for detection and/or diagnosis of SARS-CoV-2 by FDA under an Emergency Use Authorization (EUA). This EUA will remain  in effect (meaning this test can be used) for the duration of the COVID-19 declaration under Section 564(b)(1) of the Act, 21 U.S.C. section 360bbb-3(b)(1), unless the authorization is terminated or revoked sooner.    Influenza A by PCR NEGATIVE NEGATIVE Final   Influenza B by PCR NEGATIVE NEGATIVE Final    Comment: (NOTE) The Xpert Xpress SARS-CoV-2/FLU/RSV assay is intended as an aid  in  the diagnosis of influenza from Nasopharyngeal swab specimens and  should not be used as a sole basis for treatment. Nasal washings and  aspirates are unacceptable for Xpert Xpress SARS-CoV-2/FLU/RSV  testing. Fact Sheet for Patients: PinkCheek.be Fact Sheet for Healthcare Providers: GravelBags.it This test is not yet approved or cleared by the [place] FDA and  has been authorized for detection and/or diagnosis of SARS-CoV-2 by  FDA under an Emergency Use Authorization (EUA). This EUA will remain  in effect (meaning this test can be used) for the duration of the  Covid-19 declaration  under Section 564(b)(1) of the Act, 21  U.S.C. section 360bbb-3(b)(1), unless the authorization is  terminated or revoked. Performed at [facility], [address] [place], [place] [address]   Surgical pcr screen     Status: Abnormal   Collection Time: 12/24/19  1:31 AM   Specimen: Nasal Mucosa; Nasal Swab  Result Value Ref Range Status   MRSA, PCR POSITIVE (A) NEGATIVE Final    Comment: RESULT CALLED TO, READ BACK BY AND VERIFIED WITH: A NEVIUS RN 12/24/19 0524 JDW    Staphylococcus aureus POSITIVE (A) NEGATIVE Final    Comment: (NOTE) The Xpert SA Assay (FDA approved for NASAL specimens in patients 85 years of age and older), is one component of a comprehensive surveillance program. It is not intended to diagnose infection nor to guide or monitor treatment. Performed at [facility], [place] [address] [place], [place] [address]          Radiology Studies: VAS [place] ABI WITH/WO TBI  Result Date: 12/27/2019 LOWER EXTREMITY DOPPLER STUDY Indications: Gangrene.  Vascular Interventions: Left AKA. Comparison Study: No prior study. Performing Technologist: Maudry Mayhew MHA, RVT, RDCS, RDMS  Examination Guidelines: A complete evaluation includes at minimum, Doppler waveform signals and systolic blood pressure reading at the  level of bilateral brachial, anterior tibial, and posterior tibial arteries, when vessel segments are accessible. Bilateral testing is considered an integral part of a complete examination. Photoelectric Plethysmograph (PPG) waveforms and toe systolic pressure readings are included as required and additional duplex testing as needed. Limited examinations for reoccurring indications may be performed as noted.  ABI Findings: +--------+------------------+-----+-------------------+--------+ Right   Rt Pressure (mmHg)IndexWaveform           Comment  +--------+------------------+-----+-------------------+--------+ Brachial120                    triphasic                   +--------+------------------+-----+-------------------+--------+ PTA                            dampened monophasic         +--------+------------------+-----+-------------------+--------+ DP                             dampened monophasic         +--------+------------------+-----+-------------------+--------+ +--------+------------------+-----+--------+-------------+ Left    Lt Pressure (mmHg)IndexWaveformComment       +--------+------------------+-----+--------+-------------+ Brachial                               Not evaluated +--------+------------------+-----+--------+-------------+  Summary: Right: Abnormal right pedal waveforms suggestive of possible severe arterial insufficiency. Patient unable to tolerate blood pressure cuff, therefore unable to obtain right ABI.  *See table(s) above for measurements and observations.  Electronically signed by Monica Martinez MD on 12/27/2019 at 1:42:16 PM.   Final         Scheduled Meds: . enoxaparin (LOVENOX) injection  40 mg Subcutaneous Q24H  . feeding supplement (ENSURE ENLIVE)  237 mL Oral BID BM  . gabapentin  100 mg Oral TID  . multivitamin  1 tablet Oral Daily  . mupirocin ointment  1 application Topical BID  . nutrition supplement (JUVEN)  1 packet  Oral BID BM  . thiamine  100 mg Oral Daily  . vitamin B-12  1,000 mcg Oral Daily  Continuous Infusions: . sodium chloride 100 mL/hr at 12/28/19 0458  . vancomycin 1,000 mg (12/27/19 [id number])     LOS: 5 days    Time spent: 30 minutes    Jenavive Lamboy Darleen Crocker, DO [facility] Pager [phone]  If 7PM-7AM, please contact night-coverage [email or web address] Password [facility] 12/28/2019, 8:34 AM

## 2019-12-28 NOTE — Plan of Care (Signed)

## 2019-12-28 NOTE — Progress Notes (Signed)
VASCULAR LAB PRELIMINARY  PRELIMINARY  PRELIMINARY  PRELIMINARY  Right lower extremity mapping completed.    Preliminary report:  See CV proc for preliminary results.  Katrin Grabel, RVT 12/28/2019, 2:04 PM

## 2019-12-29 LAB — CBC
HCT: 20.1 % — ABNORMAL LOW (ref 39.0–52.0)
HCT: 28 % — ABNORMAL LOW (ref 39.0–52.0)
Hemoglobin: 6.7 g/dL — CL (ref 13.0–17.0)
Hemoglobin: 9.7 g/dL — ABNORMAL LOW (ref 13.0–17.0)
MCH: 32.7 pg (ref 26.0–34.0)
MCH: 32.3 pg (ref 26.0–34.0)
MCHC: 33.3 g/dL (ref 30.0–36.0)
MCHC: 34.6 g/dL (ref 30.0–36.0)
MCV: 98 fL (ref 80.0–100.0)
MCV: 93.3 fL (ref 80.0–100.0)
Platelets: 421 10*3/uL — ABNORMAL HIGH (ref 150–400)
Platelets: 500 10*3/uL — ABNORMAL HIGH (ref 150–400)
RBC: 2.05 MIL/uL — ABNORMAL LOW (ref 4.22–5.81)
RBC: 3 MIL/uL — ABNORMAL LOW (ref 4.22–5.81)
RDW: 16.8 % — ABNORMAL HIGH (ref 11.5–15.5)
RDW: 18.3 % — ABNORMAL HIGH (ref 11.5–15.5)
WBC: 5.5 10*3/uL (ref 4.0–10.5)
WBC: 6.8 10*3/uL (ref 4.0–10.5)
nRBC: 0 % (ref 0.0–0.2)
nRBC: 0 % (ref 0.0–0.2)

## 2019-12-29 LAB — BASIC METABOLIC PANEL
Anion gap: 9 (ref 5–15)
BUN: 8 mg/dL (ref 6–20)
CO2: 25 mmol/L (ref 22–32)
Calcium: 7.9 mg/dL — ABNORMAL LOW (ref 8.9–10.3)
Chloride: 101 mmol/L (ref 98–111)
Creatinine, Ser: 0.78 mg/dL (ref 0.61–1.24)
GFR calc Af Amer: >60 mL/min (ref >60)
GFR calc non Af Amer: >60 mL/min (ref >60)
Glucose, Bld: 111 mg/dL — ABNORMAL HIGH (ref 70–99)
Potassium: 3.9 mmol/L (ref 3.5–5.1)
Sodium: 135 mmol/L (ref 135–145)

## 2019-12-29 LAB — PREPARE RBC (CROSSMATCH)

## 2019-12-29 LAB — OCCULT BLOOD X 1 CARD TO LAB, STOOL: Fecal Occult Bld: NEGATIVE

## 2019-12-29 MED ORDER — VANCOMYCIN HCL 750 MG/150ML IV SOLN
750.0000 mg | Freq: Two times a day (BID) | INTRAVENOUS | Status: DC
  Administered 2019-12-29 – 2019-12-31 (×4): 750 mg via INTRAVENOUS
  Filled 2019-12-29 (×7): qty 150

## 2019-12-29 MED ORDER — SODIUM CHLORIDE 0.9 % IV SOLN
1.5000 g | INTRAVENOUS | Status: AC
  Administered 2019-12-30: 1.5 g via INTRAVENOUS
  Filled 2019-12-29: qty 1.5

## 2019-12-29 MED ORDER — SODIUM CHLORIDE 0.9% IV SOLUTION: Freq: Once | INTRAVENOUS | Status: AC

## 2019-12-29 MED ORDER — BISACODYL 5 MG PO TBEC
5.0000 mg | DELAYED_RELEASE_TABLET | Freq: Every day | ORAL | Status: DC | PRN
  Administered 2019-12-29: 5 mg via ORAL
  Filled 2019-12-29: qty 1

## 2019-12-29 NOTE — Consult Note (Signed)
[facility] Nurse Consult Note: Reason for Consult: RLE wound care Wound type: PAD Pressure Injury POA: N/A  Patient is POD 1 for arteriogram on this extremity and is scheduled for a bypass tomorrow.  I have communicated via [place] with Dr. Manuella Ghazi to see if [facility] nursing can employ a conservative strategy for odor management and protection in the interim.  Guidance for Nursing is provided today via Orders for twice daily painting of pretibial lesion and foot lesion with betadine swabstick and allow to dry.  When dry, cover with dry dressings. Float heel.  [place] nursing team will not follow, but will remain available to this patient, the nursing and medical teams.  Please re-consult if needed. Thanks, Maudie Flakes, MSN, RN, [place], Arther Abbott  Pager# [phone]

## 2019-12-29 NOTE — Progress Notes (Signed)
CRITICAL VALUE ALERT  Critical Value:  Hgb 6.7  Date & Time Notied: 12/29/19 at 04:25   Provider Notified: Lang Snow, FNP  Orders Received/Actions taken: Give 2 Units of RBCs

## 2019-12-29 NOTE — Progress Notes (Signed)
Cleansed patient wound to right tibial area and right medial toe area with NS and applied betadine. Patient wanted to leave open to air longer. States he is cold and wants it to "air out". Will attempt to place gauze and kerlex before shift change. Will pass along to PM RN. Pt resting with call bell within reach.  Will continue to monitor.

## 2019-12-29 NOTE — Progress Notes (Addendum)
[facility] [place]  Subjective  - Doing well without new complaints.   Objective 107/64 68 98.6 F (37 C) (Oral) 12 100%  Intake/Output Summary (Last 24 hours) at 12/29/2019 0718 Last data filed at 12/29/2019 0446 Gross per 24 hour  Intake 2657.63 ml  Output 2150 ml  Net 507.63 ml    Left groin soft without hematoma Lungs non labored breathing   Assessment/Planning: POD # 1 right LE angiogram  Plan NPO past MN Right fem-pop bypass 12/30/19 with Dr. Fuller Plan 12/29/2019 7:18 AM --  Laboratory Lab Results: Recent Labs    12/28/19 0447 12/29/19 0248  WBC 5.2 5.5  HGB 7.6* 6.7*  HCT 23.0* 20.1*  PLT 452* 421*   BMET Recent Labs    12/28/19 0447 12/29/19 0248  NA 132* 135  K 4.2 3.9  CL 98 101  CO2 27 25  GLUCOSE 103* 111*  BUN 6 8  CREATININE 0.63 0.78  CALCIUM 8.1* 7.9*    COAG Lab Results  Component Value Date   INR 1.0 12/28/2019   No results found for: PTT  I have seen and evaluated the patient. I agree with the PA note as documented above.  Postop day 1 status post right lower extremity angiogram.  Patient will need femoropopliteal bypass.  Patient is on the schedule tomorrow for surgery.  Please have n.p.o. after midnight.  Discussed surgery with the patient today and indications for limb salvage.  Marty Heck, MD [facility] [place] Office: [phone] Pager: [phone]

## 2019-12-29 NOTE — Progress Notes (Signed)
PROGRESS NOTE    Terrence Carr  [id number] DOB: 05-20-1961 DOA: 12/23/2019 PCP: Patient, No Pcp Per   Brief Narrative:  Patient is a 58 year old African-American male with past medical history significant for hypertension, peripheral vascular disease, tobacco abuse that is continuous, alcohol abuse, peripheral neuropathy, B12 deficiencyandsevere protein calorie malnutrition. Patient presented with bilateral leg wound and pain progressively worsening over last 3 weeks, was found to have extensive gangrene of left foot. 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 Vascular surgery plans arteriogram of the right lower extremity tomorrow.   12/28: No acute overnight events noted per nursing staff.  Plans for arteriogram of right lower extremity per vascular surgery today.  12/29: Patient noted to be anemic this morning requiring PRBC transfusion.  2 units have been ordered and are pending.  Vital signs are stable with no overt bleeding identified.  Vascular surgery plans for femoropopliteal bypass tomorrow.  Wound care as ordered.  Assessment & Plan:   Active Problems:   Osteomyelitis of ankle or foot, acute, right (HCC)   Lactic acidosis   Alcohol abuse   Alcohol withdrawal (HCC)   Macrocytic anemia   Sacral decubitus ulcer   Hyperglycemia   Essential hypertension   Peripheral arterial disease (HCC)   Vitamin B12 deficiency   Thrombocytosis (HCC)   Hypoglycemia   1. Peripheral vascular disease Critical limb ischemia bilaterally Left foot gangrene. Osteomyelitis SP left AKA Appreciate vascular surgery assistance. Patient underwent left AKA. Tolerated procedure very well. Suspecting that the patient will also require right leg amputation but currently not urgent at this time.. Patient had significant lactic acidosis at the time of admission requiring IV hydration suspect that is from hypoperfusion. Given his presentation, will continue with the antibiotics for now    will need to discuss with ID before that given the evidence of osteomyelitis on right leg which is still not operated on. 12/29: Patient has had arteriogram to right lower extremity yesterday.  Plans for femoropopliteal bypass tomorrow. -Wound care ordered for right lower extremity.  2. Alcohol abuse Alcohol withdrawal Continue CIWA protocol. Continue thiamine. -No signs of withdrawal noted  3. Acute on chronic anemia-macrocytic Severe B12 deficiency. B12 injection subcutaneously x1 followed by oral B12 daily. Also continue oral folic acid. -Requires 2 unit PRBC transfusion today which has been arranged.  No overt bleeding identified, but will obtain stool occult.  He did have BM yesterday which was brown and without any melena.  Repeat CBC in a.m.  4. Reactive thrombocytosis. -Continue to monitor closely. -Platelet count today is 421  5. Severe protein calorie malnutrition. Likely from alcohol abuse. Dietary consultation. At risk for poor wound healing. Also at risk for third spacing from hypoalbuminemia. Monitor.  6. Left buttock pressure ulcer stage II POA Continue foam dressing.  Pressure Injury 12/23/19 Buttocks Left;Right;Posterior;Lower;Upper;Mid Stage 2 - Partial thickness loss of dermis presenting as a shallow open injury with a red, pink wound bed without slough. multiple pressure injuries to buttocks. (Active)  12/23/19 2102  Location: Buttocks  Location Orientation: Left;Right;Posterior;Lower;Upper;Mid  Staging: Stage 2 - Partial thickness loss of dermis presenting as a shallow open injury with a red, pink wound bed without slough.  Wound Description (Comments): multiple pressure injuries to buttocks.   Present on Admission: Yes      DVT prophylaxis: Lovenox Code Status: Full Family Communication: None at bedside Disposition Plan: Plan for right lower extremity arteriogram per vascular surgery today.   Consultants:   Vascular  surgery  Procedures:   Left  AKA  Right lower extremity arteriogram 12/28  Plans for right femoropopliteal bypass 12/30   Antimicrobials:  Anti-infectives (From admission, onward)   Start     Dose/Rate Route Frequency Ordered Stop   12/30/19 0600  cefUROXime (ZINACEF) 1.5 g in sodium chloride 0.9 % 100 mL IVPB     1.5 g 200 mL/hr over 30 Minutes Intravenous To Short Stay 12/29/19 0731 12/31/19 0600   12/29/19 2000  vancomycin (VANCOREADY) IVPB 750 mg/150 mL     750 mg 150 mL/hr over 60 Minutes Intravenous Every 12 hours 12/29/19 0842     12/24/19 0800  vancomycin (VANCOCIN) IVPB 1000 mg/200 mL premix  Status:  Discontinued     1,000 mg 200 mL/hr over 60 Minutes Intravenous Every 12 hours 12/23/19 1950 12/29/19 0842   12/24/19 0500  ceFEPIme (MAXIPIME) 2 g in sodium chloride 0.9 % 100 mL IVPB  Status:  Discontinued     2 g 200 mL/hr over 30 Minutes Intravenous Every 8 hours 12/24/19 0017 12/26/19 0750   12/23/19 2045  ceFEPIme (MAXIPIME) 2 g in sodium chloride 0.9 % 100 mL IVPB     2 g 200 mL/hr over 30 Minutes Intravenous  Once 12/23/19 2036 12/23/19 2148   12/23/19 1930  ceFAZolin (ANCEF) IVPB 1 g/50 mL premix  Status:  Discontinued     2 g 200 mL/hr over 30 Minutes Intravenous  Once 12/23/19 1918 12/23/19 2036   12/23/19 1900  vancomycin (VANCOREADY) IVPB 1500 mg/300 mL     1,500 mg 150 mL/hr over 120 Minutes Intravenous  Once 12/23/19 1857 12/23/19 2231       Subjective: Patient seen and evaluated today with no new acute complaints or concerns. No acute concerns or events noted overnight.  Objective: Vitals:   12/28/19 2044 12/28/19 2200 12/28/19 2300 12/29/19 0429  BP:  139/73 120/63 107/64  Pulse: 77 82 79 68  Resp: 15 14 14 12   Temp: 98.4 F (36.9 C)   98.6 F (37 C)  TempSrc: Oral   Oral  SpO2: 100% 92% 100% 100%  Weight:    56.3 kg  Height:        Intake/Output Summary (Last 24 hours) at 12/29/2019 0858 Last data filed at 12/29/2019 0446 Gross per 24  hour  Intake 2657.63 ml  Output 2150 ml  Net 507.63 ml   Filed Weights   12/24/19 1008 12/28/19 0500 12/29/19 0429  Weight: 74.8 kg 55.1 kg 56.3 kg    Examination:  General exam: Appears calm and comfortable  Respiratory system: Clear to auscultation. Respiratory effort normal. Cardiovascular system: S1 & S2 heard, RRR. No JVD, murmurs, rubs, gallops or clicks. No pedal edema. Gastrointestinal system: Abdomen is nondistended, soft and nontender. No organomegaly or masses felt. Normal bowel sounds heard. Central nervous system: Alert and oriented. No focal neurological deficits. Extremities: Left AKA with right lower extremity wounds noted. Skin: Foul-smelling wounds to right lower extremity. Psychiatry: Judgement and insight appear normal. Mood & affect appropriate.     Data Reviewed: I have personally reviewed following labs and imaging studies  CBC: Recent Labs  Lab 12/23/19 1854 12/24/19 0753 12/25/19 0359 12/26/19 0727 12/27/19 0535 12/28/19 0447 12/29/19 0248  WBC 7.1 7.1 5.3 4.7 5.3 5.2 5.5  NEUTROABS 5.3 5.9  --   --   --   --   --   HGB 9.7* 8.3* 7.4* 7.3* 7.7* 7.6* 6.7*  HCT 29.8* 24.5* 21.6* 21.7* 23.4* 23.0* 20.1*  MCV 101.4* 97.2 96.4 99.1 99.6 99.6  98.0  PLT 533* 467* 391 409* 495* 452* [id number]*   Basic Metabolic Panel: Recent Labs  Lab 12/24/19 0419 12/24/19 0753 12/25/19 0359 12/26/19 0727 12/27/19 0535 12/28/19 0447 12/29/19 0248  NA  --  130* 134* 131* 134* 132* 135  K  --  3.9 3.9 3.9 3.9 4.2 3.9  CL  --  97* 101 101 99 98 101  CO2  --  22 24 21* 26 27 25   GLUCOSE  --  100* 100* 145* 102* 103* 111*  BUN  --  8 6 6  5* 6 8  CREATININE  --  0.71 0.70 0.70 0.76 0.63 0.78  CALCIUM  --  7.9* 7.8* 7.6* 8.1* 8.1* 7.9*  MG 1.9 2.0 1.9  --   --   --   --   PHOS 3.2  --   --   --   --   --   --    GFR: Estimated Creatinine Clearance: 78.2 mL/min (by C-G formula based on SCr of 0.78 mg/dL). Liver Function Tests: Recent Labs  Lab 12/24/19 0753   AST 49*  ALT 21  ALKPHOS 189*  BILITOT 1.0  PROT 5.3*  ALBUMIN 1.6*   No results for input(s): LIPASE, AMYLASE in the last 168 hours. No results for input(s): AMMONIA in the last 168 hours. Coagulation Profile: Recent Labs  Lab 12/28/19 0447  INR 1.0   Cardiac Enzymes: No results for input(s): CKTOTAL, CKMB, CKMBINDEX, TROPONINI in the last 168 hours. BNP (last 3 results) No results for input(s): PROBNP in the last 8760 hours. HbA1C: No results for input(s): HGBA1C in the last 72 hours. CBG: Recent Labs  Lab 12/23/19 2132 12/23/19 2312 12/24/19 0640  GLUCAP 62* 128* 89   Lipid Profile: No results for input(s): CHOL, HDL, LDLCALC, TRIG, CHOLHDL, LDLDIRECT in the last 72 hours. Thyroid Function Tests: No results for input(s): TSH, T4TOTAL, FREET4, T3FREE, THYROIDAB in the last 72 hours. Anemia Panel: No results for input(s): VITAMINB12, FOLATE, FERRITIN, TIBC, IRON, RETICCTPCT in the last 72 hours. Sepsis Labs: Recent Labs  Lab 12/23/19 1854 12/23/19 2124  LATICACIDVEN 5.4* 2.1*    Recent Results (from the past 240 hour(s))  Respiratory Panel by RT PCR (Flu A&B, Covid) - Nasopharyngeal Swab     Status: None   Collection Time: 12/23/19  8:55 PM   Specimen: Nasopharyngeal Swab  Result Value Ref Range Status   SARS Coronavirus 2 by RT PCR NEGATIVE NEGATIVE Final    Comment: (NOTE) SARS-CoV-2 target nucleic acids are NOT DETECTED. The SARS-CoV-2 RNA is generally detectable in upper respiratoy specimens during the acute phase of infection. The lowest concentration of SARS-CoV-2 viral copies this assay can detect is 131 copies/mL. A negative result does not preclude SARS-Cov-2 infection and should not be used as the sole basis for treatment or other patient management decisions. A negative result may occur with  improper specimen collection/handling, submission of specimen other than nasopharyngeal swab, presence of viral mutation(s) within the areas targeted by  this assay, and inadequate number of viral copies (<131 copies/mL). A negative result must be combined with clinical observations, patient history, and epidemiological information. The expected result is Negative. Fact Sheet for Patients:  PinkCheek.be Fact Sheet for Healthcare Providers:  GravelBags.it This test is not yet ap proved or cleared by the [place] FDA and  has been authorized for detection and/or diagnosis of SARS-CoV-2 by FDA under an Emergency Use Authorization (EUA). This EUA will remain  in effect (meaning this test can  be used) for the duration of the COVID-19 declaration under Section 564(b)(1) of the Act, 21 U.S.C. section 360bbb-3(b)(1), unless the authorization is terminated or revoked sooner.    Influenza A by PCR NEGATIVE NEGATIVE Final   Influenza B by PCR NEGATIVE NEGATIVE Final    Comment: (NOTE) The Xpert Xpress SARS-CoV-2/FLU/RSV assay is intended as an aid in  the diagnosis of influenza from Nasopharyngeal swab specimens and  should not be used as a sole basis for treatment. Nasal washings and  aspirates are unacceptable for Xpert Xpress SARS-CoV-2/FLU/RSV  testing. Fact Sheet for Patients: PinkCheek.be Fact Sheet for Healthcare Providers: GravelBags.it This test is not yet approved or cleared by the [place] FDA and  has been authorized for detection and/or diagnosis of SARS-CoV-2 by  FDA under an Emergency Use Authorization (EUA). This EUA will remain  in effect (meaning this test can be used) for the duration of the  Covid-19 declaration under Section 564(b)(1) of the Act, 21  U.S.C. section 360bbb-3(b)(1), unless the authorization is  terminated or revoked. Performed at [facility], [address], [place], [place] [address]   Surgical pcr screen     Status: Abnormal   Collection Time: 12/24/19  1:31 AM   Specimen: Nasal  Mucosa; Nasal Swab  Result Value Ref Range Status   MRSA, PCR POSITIVE (A) NEGATIVE Final    Comment: RESULT CALLED TO, READ BACK BY AND VERIFIED WITH: A NEVIUS RN 12/24/19 0524 JDW    Staphylococcus aureus POSITIVE (A) NEGATIVE Final    Comment: (NOTE) The Xpert SA Assay (FDA approved for NASAL specimens in patients 70 years of age and older), is one component of a comprehensive surveillance program. It is not intended to diagnose infection nor to guide or monitor treatment. Performed at [facility], [place] [address], [place], [place] [address]          Radiology Studies: PERIPHERAL VASCULAR CATHETERIZATION  Result Date: 12/28/2019 Patient name: DREYTON SESLER MRN: [id number] DOB: 1961/05/01 Sex: male 12/28/2019 Pre-operative Diagnosis: Critical right lower extremity ischemia with wounds Post-operative diagnosis:  Same Surgeon:  Erlene Quan C. Donzetta Matters, MD Procedure Performed: 1.  Ultrasound-guided cannulation left common femoral artery 2.  Aortogram 3.  Selection of right common femoral artery and right lower extremity angiography 4.  Moderate sedation with fentanyl and Versed for 22 minutes Indications: 58 year old male presented with bilateral lower extremity ischemia.  He is undergone left above-knee amputation.  He is now indicated for right lower extremity angiography possible intervention. Findings: Aorta and iliac segments are free of flow-limiting stenosis.  The right side common iliac artery has a stent that appears patent.  Common femoral artery has patent patch.  The SFA is flush occluded.  He reconstitutes above the knee popliteal artery which is diseased to below the knee.  Does have three-vessel runoff to the foot. He will be considered for right femoral to popliteal artery bypass.  Procedure:  The patient was identified in the holding area and taken to room 8.  The patient was then placed supine on the table and prepped and draped in the usual sterile fashion.  A time out was  called.  Ultrasound was used to evaluate the left common femoral artery.  There is anesthetized 1% lidocaine cannulated with direct ultrasound visualization a micropuncture needle followed by wire sheath.  Bentson wire was placed.  And images saved the permanent record of the ultrasound-guided cannulation site.  5 French sheath was placed.  Omni catheter placed to level  L1 aortogram performed.  We crossed bifurcation perform right lower extremity angiography.  With above findings patient will need bypass surgery.  He remains high risk for amputation.  Catheter was removed over wire.  Sheath will be pulled in postoperative holding.  He tolerated procedure well without any complication Contrast: 65 cc Brandon C. Donzetta Matters, MD [facility] [place] Office: [phone] Pager: [phone]  VAS [place] LOWER EXTREMITY SAPHENOUS VEIN MAPPING  Result Date: 12/28/2019 LOWER EXTREMITY VEIN MAPPING Indications:  Pre-op Risk Factors: PAD.  Comparison Study: No prior study Performing Technologist: Sharion Dove RVS  Examination Guidelines: A complete evaluation includes B-mode imaging, spectral Doppler, color Doppler, and power Doppler as needed of all accessible portions of each vessel. Bilateral testing is considered an integral part of a complete examination. Limited examinations for reoccurring indications may be performed as noted. +---------------+-----------+----------------------+---------------+-----------+    RT Diameter   RT Findings          GSV             LT Diameter   LT Findings        (cm)                                               (cm)                    +---------------+-----------+----------------------+---------------+-----------+       0.49                       Saphenofemoral                                                                      Junction                                     +---------------+-----------+----------------------+---------------+-----------+       0.38                        Proximal thigh                                  +---------------+-----------+----------------------+---------------+-----------+       0.38        branching        Mid thigh                                     +---------------+-----------+----------------------+---------------+-----------+       0.38                        Distal thigh                                   +---------------+-----------+----------------------+---------------+-----------+       0.22  Knee                                       +---------------+-----------+----------------------+---------------+-----------+       0.26        branching        Prox calf                                     +---------------+-----------+----------------------+---------------+-----------+       0.24        branching         Mid calf                                     +---------------+-----------+----------------------+---------------+-----------+       0.24                        Distal calf                                    +---------------+-----------+----------------------+---------------+-----------+       0.21                           Ankle                                       +---------------+-----------+----------------------+---------------+-----------+    Preliminary         Scheduled Meds:  aspirin EC  81 mg Oral Daily   feeding supplement (ENSURE ENLIVE)  237 mL Oral BID BM   gabapentin  100 mg Oral TID   heparin  5,000 Units Subcutaneous Q8H   multivitamin  1 tablet Oral Daily   nutrition supplement (JUVEN)  1 packet Oral BID BM   sodium chloride flush  3 mL Intravenous Q12H   thiamine  100 mg Oral Daily   vitamin B-12  1,000 mcg Oral Daily   Continuous Infusions:  sodium chloride Stopped (12/28/19 0857)   sodium chloride     [START ON 12/30/2019] cefUROXime (ZINACEF)  IV     vancomycin       LOS: 6 days    Time spent: 30 minutes    Kyrra Prada Darleen Crocker, DO [facility] Pager [phone]  If 7PM-7AM, please contact night-coverage [email or web address] Password [facility] 12/29/2019, 8:58 AM

## 2019-12-29 NOTE — Progress Notes (Signed)
Pharmacy Antibiotic Note  Terrence Carr is a 58 y.o. male admitted on 12/23/2019 with BL extremity wounds.  Pharmacy has been consulted for Vancomcyin dosing.  ID: day #7 abx, Vanc alone for bilateral critical limb ischemia, osteo per [facility] note, s/p L AKA on 12/24. - Afb, WBC 5.5, CRP 22.7, LA 2.1. Weight down to 56.3kg  Vancomycin 12/23 >>  Cefepime 12/23 >> 12/26 Bactroban 12/24>>(12/28)  12/23 Resp PCR: negative 12/24 MRSA PCR: Positive  Vancomycin 750 mg IV Q 12 hrs. Goal AUC 400-550. Expected AUC: 463.5 SCr used: 0.78  Plan: Decrease Vancomycin to 750mg  IV q 12h [facility] notes plan to discuss abx with ID   Height: 5' 5.98" (167.6 cm) Weight: 124 lb 1.9 oz (56.3 kg) IBW/kg (Calculated) : 63.76  Temp (24hrs), Avg:98.3 F (36.8 C), Min:98 F (36.7 C), Max:98.6 F (37 C)  Recent Labs  Lab 12/23/19 1854 12/23/19 2124 12/25/19 0359 12/26/19 0727 12/27/19 0535 12/28/19 0447 12/29/19 0248  WBC 7.1  --  5.3 4.7 5.3 5.2 5.5  CREATININE 0.62  --  0.70 0.70 0.76 0.63 0.78  LATICACIDVEN 5.4* 2.1*  --   --   --   --   --     Estimated Creatinine Clearance: 78.2 mL/min (by C-G formula based on SCr of 0.78 mg/dL).    Allergies  Allergen Reactions  . Ibuprofen Other (See Comments)    Patient said "the little brown pills" that he bought from the store made him talk out of his head (reported by [name])  . Other Other (See Comments)    Unknown reaction to "Dihydroxyalaminum aminoacetate magnesium carbonate" (reported by [name], PharmD, BCPS Clinical Staff Pharmacist [email or web address]   [name] 12/29/2019 8:46 AM

## 2019-12-29 NOTE — PMR Pre-admission (Signed)
PMR Admission Coordinator Pre-Admission Assessment  Patient: Terrence Carr is an 58 y.o., male MRN: [id number] DOB: [birth year] Height: 5' 5.98" (167.6 cm) Weight: 57.3 kg  Insurance Information HMO:     PPO:      PCP:      IPA:      80/20:      OTHER:  PRIMARY: Medicaid [place] [facility]      Policy#: [id number]      Subscriber: pt Benefits:  Phone #: passport one online     Name: 12/29 Eff. 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 Presented to [facility] on 12/23/2019 with 3 week history of progressive and worsening wounds to bilateral LES, with yellowish and malodorous discharge with severe pain to the wounds. PCP had prescribed antibiotics, but patient had not taken. Patient previously seen at [facility] as an outpatient 09/02/2019 and was noted to be S/P right femoral endarterectomy and right iliac artery stent placement by Dr. Radene Knee in May 2020 in which he had no LE wounds.    s were within normal range.  Work-up in the ED showed macrocytic anemia,  Hypoglycemia, hyponatremia,  lactic acid 5.4> 2.1, alcohol level was 23.  COVID-19 virus test, influenza A and B were negative.  Right tibia and fibula showed diffuse edematous thickening of the RLE with a  large ankle effusion and subcortical lucency at the distal tibial metaphysis worrisome for osteomyelitis.  Left foot x-ray showed soft tissue ulceration along the plantar aspect of the heel and posterolateral aspect of the foot.  Patient was started on IV cefepime and vancomycin.  Patient was placed on CIWA protocol and IV hydration of 1 L NS was given.  Dr. Donnetta Hutching (vascular surgeon at [facility]) was consulted per ED PA who states that it was recommended for patient to be transferred to [facility].  Dr. Donnetta Hutching performed L AKA on 12/24/2019. Patient underwent right lower extremity angiogram and subsequently underwent right femoropopliteal bypass on 12/30/2019.  Acute on chronic macrocytic anemia and sever B12 deficiency. Continued B12 injections and followed by oral. Continue folic acid. S/P 2 units PRBCs for Hgb 6.7 on 12/28.  Patient's medical record from [facility]  has been reviewed by the rehabilitation admission coordinator and physician.  Past Medical History  Past Medical History:  Diagnosis Date  . Hypertension     Family History   family history is not on file.  Prior Rehab/Hospitalizations Has the patient had prior rehab or hospitalizations prior to admission? Yes  Has the patient had major surgery during 100 days prior to admission? Yes   Current Medications  Current Facility-Administered Medications:  .  0.9 %  sodium chloride infusion, 500 mL, Intravenous, Once PRN, Rhyne, Samantha J, PA-C .  0.9 %  sodium chloride infusion, , Intravenous, Continuous, Rhyne, Samantha J, PA-C .  acetaminophen (TYLENOL) tablet 325-650 mg, 325-650 mg, Oral, Q4H PRN **OR** acetaminophen (TYLENOL) suppository 325-650 mg, 325-650 mg, Rectal, Q4H PRN, Rhyne, Samantha J, PA-C .  alum & mag hydroxide-simeth (MAALOX/MYLANTA) 200-200-20 MG/5ML suspension 15-30 mL, 15-30 mL, Oral, Q2H PRN, Rhyne, Samantha J, PA-C .  aspirin EC tablet 81 mg, 81 mg, Oral, Daily, Rhyne, Samantha J, PA-C, 81 mg at 12/31/19  0949 .  atorvastatin (LIPITOR) tablet 20 mg, 20 mg, Oral, q1800, Rhyne, Samantha J, PA-C, 20 mg at 12/30/19 2321 .  bisacodyl (DULCOLAX) EC tablet 5 mg, 5 mg, Oral, Daily PRN, Rhyne, Samantha J, PA-C, 5 mg at 12/29/19 2243 .  docusate sodium (COLACE) capsule 100 mg, 100 mg, Oral, BID, Rhyne, Samantha J, PA-C, 100 mg at 12/31/19 0949 .  feeding supplement (ENSURE ENLIVE) (ENSURE ENLIVE) liquid 237 mL, 237 mL, Oral, BID BM, Rhyne, Samantha J, PA-C .  folic acid (FOLVITE) tablet 1 mg, 1 mg, Oral, Daily, Pokhrel, Laxman, MD, 1 mg at 12/31/19 0948 .  gabapentin (NEURONTIN) capsule 100 mg, 100 mg, Oral, TID, Rhyne, Samantha J, PA-C, 100 mg at 12/31/19 0949 .  guaiFENesin-dextromethorphan (ROBITUSSIN DM) 100-10 MG/5ML syrup 15 mL, 15 mL, Oral, Q4H PRN, Rhyne, Samantha J, PA-C .  heparin injection 5,000 Units, 5,000 Units, Subcutaneous, Q8H, Rhyne, Samantha J, PA-C, 5,000 Units at 12/31/19 1540 .  hydrALAZINE (APRESOLINE) injection 5 mg, 5 mg, Intravenous, Q20 Min PRN, Rhyne, Samantha J, PA-C .  labetalol (NORMODYNE) injection 10 mg, 10 mg, Intravenous, Q10 min PRN, Rhyne, Samantha J, PA-C .  magnesium sulfate IVPB 2 g 50 mL, 2 g, Intravenous, Daily PRN, Rhyne, Samantha J, PA-C .  metoprolol tartrate (LOPRESSOR) injection 2-5 mg, 2-5 mg, Intravenous, Q2H PRN, Rhyne, Samantha J, PA-C .  multivitamin with minerals tablet 1 tablet, 1 tablet, Oral, Daily, Pokhrel, Laxman, MD, 1 tablet at 12/31/19 0949 .  nutrition supplement (JUVEN) (JUVEN) powder packet 1 packet, 1 packet, Oral, BID BM, Rhyne, Hulen Shouts, PA-C, 1 packet at 12/31/19 [phone] .  ondansetron (ZOFRAN) injection 4 mg, 4 mg, Intravenous, Q6H PRN, Rhyne, Samantha J, PA-C .  oxyCODONE (Oxy IR/ROXICODONE) immediate release tablet 5-10 mg, 5-10 mg, Oral, Q4H PRN, Rhyne, Samantha J, PA-C, 10 mg at 12/31/19 0527 .  pantoprazole (PROTONIX) EC tablet 40 mg, 40 mg, Oral, Daily, Rhyne, Samantha J, PA-C, 40 mg at 12/31/19 0949 .  phenol (CHLORASEPTIC) mouth spray  1 spray, 1 spray, Mouth/Throat, PRN, Rhyne, Samantha J, PA-C .  polyethylene glycol (MIRALAX / GLYCOLAX) packet 17 g, 17 g, Oral, Daily, Rhyne, Samantha J, PA-C, 17 g at 12/31/19 0948 .  potassium chloride SA (KLOR-CON) CR tablet 20-40 mEq, 20-40 mEq, Oral, Daily PRN, Rhyne, Samantha J, PA-C .  thiamine tablet 100 mg, 100 mg, Oral, Daily, 100 mg at 12/31/19 0948 **OR** [DISCONTINUED] thiamine (B-1) injection 100 mg, 100 mg, Intravenous, Daily, Setzer, Sandra J, PA-C .  vancomycin (VANCOREADY) IVPB 750 mg/150 mL, 750 mg, Intravenous, Q12H, Rhyne, Samantha J, PA-C, Last Rate: 150 mL/hr at 12/31/19 0956, 750 mg at 12/31/19 0956 .  vitamin B-12 (CYANOCOBALAMIN) tablet 1,000 mcg, 1,000 mcg, Oral, Daily, Rhyne, Samantha J, PA-C, 1,000 mcg at 12/31/19 6195  Patients Current Diet:  Diet Order            Diet - low sodium heart healthy        Diet regular Room service appropriate? Yes; Fluid consistency: Thin  Diet effective now  Precautions / Restrictions Precautions Precautions: Fall Restrictions Weight Bearing Restrictions: Yes RLE Weight Bearing: Weight bearing as tolerated LLE Weight Bearing: Non weight bearing   Has the patient had 2 or more falls or a fall with injury in the past year? No  Prior Activity Level Limited Community (1-2x/wk): Mod I with RW; does not drive  Prior Functional Level Self Care: Did the patient need help bathing, dressing, using the toilet or eating? Independent  Indoor Mobility: Did the patient need assistance with walking from room to room (with or without device)? Independent  Stairs: Did the patient need assistance with internal or external stairs (with or without device)? Independent  Functional Cognition: Did the patient need help planning regular tasks such as shopping or remembering to take medications? Needed some help  Home Assistive Devices / [place] Devices/Equipment: None Home Equipment: Walker - 4 wheels,  Shower seat  Prior Device Use: Indicate devices/aids used by the patient prior to current illness, exacerbation or injury? Walker  Current Functional Level Cognition  Overall Cognitive Status: No family/caregiver present to determine baseline cognitive functioning Orientation Level: Oriented to person, Oriented to time, Disoriented to place, Oriented to situation(knows he is having a procedure done thinks he is at a SNF) Safety/Judgement: Decreased awareness of safety    Extremity Assessment (includes Sensation/Coordination)  Upper Extremity Assessment: Defer to OT evaluation  Lower Extremity Assessment: LLE deficits/detail, RLE deficits/detail RLE Deficits / Details: strength grossly 3+/5, ROM in hip and knee lacking full extension RLE Sensation: decreased light touch RLE Coordination: decreased fine motor LLE Deficits / Details: L AKA    ADLs  Overall ADL's : Needs assistance/impaired Eating/Feeding: Independent, Sitting Grooming: Set up, Sitting Upper Body Bathing: Set up, Sitting Lower Body Bathing: Maximal assistance Lower Body Bathing Details (indicate cue type and reason): Max A +2 sit<>stand Upper Body Dressing : Minimal assistance, Sitting Lower Body Dressing: Total assistance Lower Body Dressing Details (indicate cue type and reason): Max A +2 sit<>stand Toilet Transfer: Maximal assistance, +2 for physical assistance, RW, Stand-pivot Toilet Transfer Details (indicate cue type and reason): Bed>recliner on pt's right ToiletingRunner, broadcasting/film/video and Hygiene: Total assistance Toileting - Clothing Manipulation Details (indicate cue type and reason): Max A +2 sit<>stand    Mobility  Overal bed mobility: Needs Assistance Bed Mobility: Supine to Sit, Rolling Rolling: Supervision Sidelying to sit: Mod assist, HOB elevated Supine to sit: Min assist, HOB elevated General bed mobility comments: heavy use of rails and increased time for bed mobility    Transfers  Overall  transfer level: Needs assistance Equipment used: Rolling walker (2 wheeled) Transfers: Sit to/from Stand, Google Transfers Sit to Stand: Max assist, From elevated surface Squat pivot transfers: Max assist General transfer comment: pt performs squat pivot transfer to drop arm recliner with PT assist and knee block. Pt performs sit to stand with use of RW from recliner, PT blocking R foot as it has a tendency to slip forward due to poor knee flexion    Ambulation / Gait / Stairs / Wheelchair Mobility       Posture / Balance Dynamic Sitting Balance Sitting balance - Comments: minG-close supervision for 5 minutes Balance Overall balance assessment: Needs assistance Sitting-balance support: Bilateral upper extremity supported, Feet supported Sitting balance-Leahy Scale: Fair Sitting balance - Comments: minG-close supervision for 5 minutes Standing balance support: Bilateral upper extremity supported, During functional activity Standing balance-Leahy Scale: Zero Standing balance comment: maxA with BUE support of RW    Special needs/care consideration  BiPAP/CPAP  CPM  Continuous Drip IV  Dialysis         Life Vest  Oxygen  Special Bed  Trach Size  Wound Vac  Skin L AKA surgical site; MASD buttocks stage 2; rash to abdomen and bilateral arms  ; surgical site to RLE Bowel mgmt: LBM 12/30/2019 incontinent Bladder mgmt: external catheter Diabetic mgmt:  Behavioral consideration  Chemo/radiation  Designated visitor is Event organiser, Psychiatrist CIWA on acute for daily alcohol consumption pta   Previous Home Environment  Living Arrangements: (girlfriend, Rozella and "stepdtr", Event organiser and her boyfriend)  Lives With: Significant other, Other (Comment) Available Help at Discharge: Mamie Laurel, grand dtr with pt and his GF when Graylon Good is working) Type of Home: [place]: One level Home Access: Stairs to enter Technical brewer of Steps: 2(family to build a ramp) Bathroom  Shower/Tub: Multimedia programmer: Standard Bathroom Accessibility: Yes How Accessible: Accessible via walker Yoder: No Additional Comments: patient sponge bathes  Discharge Living Setting Plans for Discharge Living Setting: Patient's home, Lives with (comment)(girlfriend, Rozella and her daughter, Graylon Good and Shanta's bo) Type of Home at Discharge: House Discharge Home Layout: One level Discharge Home Access: Stairs to enter Entrance Stairs-Rails: None Entrance Stairs-Number of Steps: 2; family plans to build ramp Discharge Bathroom Shower/Tub: Walk-in shower(but pt sponge bathes) Discharge Bathroom Toilet: Standard Discharge Bathroom Accessibility: Yes How Accessible: Accessible via walker Does the patient have any problems obtaining your medications?: No  Social/Family/Support Systems Patient Roles: Parent, Associate Professor Information: Event organiser, "stepdaughter" Anticipated Caregiver: Graylon Good and Lasha Anticipated Caregiver's Contact Information: see above Ability/Limitations of Caregiver: Graylon Good works, Music therapist is there when Graylon Good is working to care for [name] and patient Caregiver Availability: 24/7 Discharge Plan Discussed with Primary Caregiver: Yes Is Caregiver In Agreement with Plan?: Yes Does Caregiver/Family have Issues with Lodging/Transportation while Pt is in Rehab?: No  Goals/Additional Needs Patient/Family Goal for Rehab: Mod I to superivsion with PT and OT Expected length of stay: ELOS 10 to 14 days Special Service Needs: Rozella, his girlfriend has history of lung cancer and family care for them both Pt/Family Agrees to Admission and willing to participate: Yes Program Orientation Provided & Reviewed with Pt/Caregiver Including Roles  & Responsibilities: Yes  Decrease burden of Care through IP rehab admission:   Possible need for SNF placement upon discharge:   Patient Condition: I have reviewed medical records from [facility],  spoken with  patient and family member. I met with patient at the bedside for inpatient rehabilitation assessment.  Patient will benefit from ongoing PT and OT, can actively participate in 3 hours of therapy a day 5 days of the week, and can make measurable gains during the admission.  Patient will also benefit from the coordinated team approach during an Inpatient Acute Rehabilitation admission.  The patient will receive intensive therapy as well as Rehabilitation physician, nursing, social worker, and care management interventions.  Due to bladder management, bowel management, safety, skin/wound care, disease management, medication administration, pain management and patient education the patient requires 24 hour a day rehabilitation nursing.  The patient is currently Max A for transfers no ambulation yet and Max A to Total A for basic ADLs.  Discharge setting and therapy post discharge at home with home health is anticipated.  Patient has agreed to participate in the Acute Inpatient Rehabilitation Program and will admit 12/31/2019.  Preadmission Screen Completed By: Completed by Danne Baxter, RN MSN with updates by Raechel Ache, 12/31/2019 1:46 PM ______________________________________________________________________  Discussed status with Dr. Dagoberto Ligas on 12/31/2019 at 1:42PM and received approval for admission today.  Admission Coordinator: Danne Baxter RN MSN Raechel Ache, OT, time 1:42PM/Date 12/31/2019   Assessment/Plan: Diagnosis: 1. Does the need for close, 24 hr/day Medical supervision in concert with the patient's rehab needs make it unreasonable for this patient to be served in a less intensive setting? Yes 2. Co-Morbidities requiring supervision/potential complications: tobacco and alcohol abuse, severe malnutrition, HTN, PVD, L AKA, poor dentition, poor appetite, Stage II L buttock pressure ulcer 3. Due to bladder management, bowel management, safety, skin/wound care, disease  management, medication administration, pain management and patient education, does the patient require 24 hr/day rehab nursing? Yes 4. Does the patient require coordinated care of a physician, rehab nurse, PT, OT, and SLP to address physical and functional deficits in the context of the above medical diagnosis(es)? Yes Addressing deficits in the following areas: balance, endurance, strength, transferring, bathing, dressing, feeding, grooming and toileting 5. Can the patient actively participate in an intensive therapy program of at least 3 hrs of therapy 5 days a week? Yes 6. The potential for patient to make measurable gains while on inpatient rehab is fair 7. Anticipated functional outcomes upon discharge from inpatient rehab: modified independent and supervision PT, modified independent and supervision OT, n/a SLP 8. Estimated rehab length of stay to reach the above functional goals is: 10-14 days 9. Anticipated discharge destination: Home 10. Overall Rehab/Functional Prognosis: fair   MD Signature:

## 2019-12-29 NOTE — Progress Notes (Signed)
Inpatient Rehabilitation Admissions Coordinator  I met with patient at bedside for rehab assessment. He is very positive and encouraged about rehab. He gave me permission to contact his stepdaughter, Graylon Good. I left her a voicemail, but spoke with his grand daughter, Mamie Laurel. Patient has caregiver support once he is discharged home, but family realize he will need some rehab prior to return home. Patient is a good candidate for CIR admit after postop recovery from planned LE bypass surgery tomorrow. I will follow his progress to determine timing of an inpt rehab/CIR admit.  Danne Baxter, RN, MSN Rehab Admissions Coordinator [phone] 12/29/2019 11:44 AM

## 2019-12-30 ENCOUNTER — Encounter (HOSPITAL_COMMUNITY): Payer: Self-pay | Admitting: Internal Medicine

## 2019-12-30 ENCOUNTER — Encounter (HOSPITAL_COMMUNITY)
Admission: EM | Disposition: A | Discharge status: Rehabilitation Facility | Payer: Self-pay | Source: Home / Self Care | Attending: Internal Medicine

## 2019-12-30 ENCOUNTER — Inpatient Hospital Stay (HOSPITAL_COMMUNITY): Payer: Medicaid Other | Admitting: Certified Registered Nurse Anesthetist

## 2019-12-30 HISTORY — PX: FEMORAL-POPLITEAL BYPASS GRAFT: SHX937

## 2019-12-30 LAB — BASIC METABOLIC PANEL
Anion gap: 8 (ref 5–15)
BUN: 10 mg/dL (ref 6–20)
CO2: 25 mmol/L (ref 22–32)
Calcium: 8.3 mg/dL — ABNORMAL LOW (ref 8.9–10.3)
Chloride: 103 mmol/L (ref 98–111)
Creatinine, Ser: 0.68 mg/dL (ref 0.61–1.24)
GFR calc Af Amer: >60 mL/min (ref >60)
GFR calc non Af Amer: >60 mL/min (ref >60)
Glucose, Bld: 100 mg/dL — ABNORMAL HIGH (ref 70–99)
Potassium: 4.1 mmol/L (ref 3.5–5.1)
Sodium: 136 mmol/L (ref 135–145)

## 2019-12-30 LAB — CBC
HCT: 27.7 % — ABNORMAL LOW (ref 39.0–52.0)
HCT: 33.5 % — ABNORMAL LOW (ref 39.0–52.0)
Hemoglobin: 9.4 g/dL — ABNORMAL LOW (ref 13.0–17.0)
Hemoglobin: 11 g/dL — ABNORMAL LOW (ref 13.0–17.0)
MCH: 31.9 pg (ref 26.0–34.0)
MCH: 31.9 pg (ref 26.0–34.0)
MCHC: 33.9 g/dL (ref 30.0–36.0)
MCHC: 32.8 g/dL (ref 30.0–36.0)
MCV: 93.9 fL (ref 80.0–100.0)
MCV: 97.1 fL (ref 80.0–100.0)
Platelets: 531 10*3/uL — ABNORMAL HIGH (ref 150–400)
Platelets: 701 10*3/uL — ABNORMAL HIGH (ref 150–400)
RBC: 2.95 MIL/uL — ABNORMAL LOW (ref 4.22–5.81)
RBC: 3.45 MIL/uL — ABNORMAL LOW (ref 4.22–5.81)
RDW: 18.2 % — ABNORMAL HIGH (ref 11.5–15.5)
RDW: 18.6 % — ABNORMAL HIGH (ref 11.5–15.5)
WBC: 6.6 10*3/uL (ref 4.0–10.5)
WBC: 11 10*3/uL — ABNORMAL HIGH (ref 4.0–10.5)
nRBC: 0 % (ref 0.0–0.2)
nRBC: 0 % (ref 0.0–0.2)

## 2019-12-30 LAB — PROTIME-INR
INR: 0.9 (ref 0.8–1.2)
Prothrombin Time: 12.5 seconds (ref 11.4–15.2)

## 2019-12-30 LAB — CREATININE, SERUM
Creatinine, Ser: 0.68 mg/dL (ref 0.61–1.24)
GFR calc Af Amer: >60 mL/min (ref >60)
GFR calc non Af Amer: >60 mL/min (ref >60)

## 2019-12-30 LAB — SURGICAL PATHOLOGY

## 2019-12-30 SURGERY — BYPASS GRAFT FEMORAL-POPLITEAL ARTERY
Anesthesia: General | Site: Leg Upper | Laterality: Right

## 2019-12-30 MED ORDER — HEPARIN SODIUM (PORCINE) 1000 UNIT/ML IJ SOLN
INTRAMUSCULAR | Status: AC
  Filled 2019-12-30: qty 1

## 2019-12-30 MED ORDER — FENTANYL CITRATE (PF) 250 MCG/5ML IJ SOLN
INTRAMUSCULAR | Status: AC
  Filled 2019-12-30: qty 5

## 2019-12-30 MED ORDER — PANTOPRAZOLE SODIUM 40 MG PO TBEC
40.0000 mg | DELAYED_RELEASE_TABLET | Freq: Every day | ORAL | Status: DC
  Administered 2019-12-30 – 2019-12-31 (×2): 40 mg via ORAL
  Filled 2019-12-30 (×2): qty 1

## 2019-12-30 MED ORDER — SODIUM CHLORIDE 0.9 % IV SOLN
INTRAVENOUS | Status: AC
  Filled 2019-12-30: qty 1.2

## 2019-12-30 MED ORDER — LIDOCAINE 2% (20 MG/ML) 5 ML SYRINGE
INTRAMUSCULAR | Status: DC | PRN
  Administered 2019-12-30: 40 mg via INTRAVENOUS

## 2019-12-30 MED ORDER — MIDAZOLAM HCL 5 MG/5ML IJ SOLN
INTRAMUSCULAR | Status: DC | PRN
  Administered 2019-12-30: 2 mg via INTRAVENOUS

## 2019-12-30 MED ORDER — HEPARIN SODIUM (PORCINE) 5000 UNIT/ML IJ SOLN
5000.0000 [IU] | Freq: Three times a day (TID) | INTRAMUSCULAR | Status: DC
  Administered 2019-12-31: 5000 [IU] via SUBCUTANEOUS
  Filled 2019-12-30: qty 1

## 2019-12-30 MED ORDER — ACETAMINOPHEN 325 MG RE SUPP: 325.0000 mg | RECTAL | Status: DC | PRN

## 2019-12-30 MED ORDER — 0.9 % SODIUM CHLORIDE (POUR BTL) OPTIME
TOPICAL | Status: DC | PRN
  Administered 2019-12-30: 2000 mL

## 2019-12-30 MED ORDER — CEFAZOLIN SODIUM-DEXTROSE 2-4 GM/100ML-% IV SOLN
2.0000 g | Freq: Three times a day (TID) | INTRAVENOUS | Status: AC
  Administered 2019-12-30 – 2019-12-31 (×2): 2 g via INTRAVENOUS
  Filled 2019-12-30 (×2): qty 100

## 2019-12-30 MED ORDER — DEXAMETHASONE SODIUM PHOSPHATE 10 MG/ML IJ SOLN
INTRAMUSCULAR | Status: DC | PRN
  Administered 2019-12-30: 10 mg via INTRAVENOUS

## 2019-12-30 MED ORDER — LIDOCAINE 2% (20 MG/ML) 5 ML SYRINGE
INTRAMUSCULAR | Status: AC
  Filled 2019-12-30: qty 5

## 2019-12-30 MED ORDER — ROCURONIUM BROMIDE 100 MG/10ML IV SOLN
INTRAVENOUS | Status: DC | PRN
  Administered 2019-12-30: 30 mg via INTRAVENOUS
  Administered 2019-12-30: 50 mg via INTRAVENOUS
  Administered 2019-12-30: 20 mg via INTRAVENOUS

## 2019-12-30 MED ORDER — MIDAZOLAM HCL 2 MG/2ML IJ SOLN
INTRAMUSCULAR | Status: AC
  Filled 2019-12-30: qty 2

## 2019-12-30 MED ORDER — SODIUM CHLORIDE 0.9 % IV SOLN: 500.0000 mL | Freq: Once | INTRAVENOUS | Status: DC | PRN

## 2019-12-30 MED ORDER — ROCURONIUM BROMIDE 10 MG/ML (PF) SYRINGE
PREFILLED_SYRINGE | INTRAVENOUS | Status: AC
  Filled 2019-12-30: qty 10

## 2019-12-30 MED ORDER — ONDANSETRON HCL 4 MG/2ML IJ SOLN
INTRAMUSCULAR | Status: AC
  Filled 2019-12-30: qty 2

## 2019-12-30 MED ORDER — HYDRALAZINE HCL 20 MG/ML IJ SOLN: 5.0000 mg | INTRAMUSCULAR | Status: DC | PRN

## 2019-12-30 MED ORDER — LABETALOL HCL 5 MG/ML IV SOLN: 10.0000 mg | INTRAVENOUS | Status: DC | PRN

## 2019-12-30 MED ORDER — ATORVASTATIN CALCIUM 10 MG PO TABS
20.0000 mg | ORAL_TABLET | Freq: Every day | ORAL | Status: DC
  Administered 2019-12-30 – 2019-12-31 (×2): 20 mg via ORAL
  Filled 2019-12-30 (×2): qty 2

## 2019-12-30 MED ORDER — ALUM & MAG HYDROXIDE-SIMETH 200-200-20 MG/5ML PO SUSP: 15.0000 mL | ORAL | Status: DC | PRN

## 2019-12-30 MED ORDER — POTASSIUM CHLORIDE CRYS ER 20 MEQ PO TBCR: 20.0000 meq | EXTENDED_RELEASE_TABLET | Freq: Every day | ORAL | Status: DC | PRN

## 2019-12-30 MED ORDER — METOPROLOL TARTRATE 5 MG/5ML IV SOLN: 2.0000 mg | INTRAVENOUS | Status: DC | PRN

## 2019-12-30 MED ORDER — ONDANSETRON HCL 4 MG/2ML IJ SOLN: 4.0000 mg | Freq: Four times a day (QID) | INTRAMUSCULAR | Status: DC | PRN

## 2019-12-30 MED ORDER — PROTAMINE SULFATE 10 MG/ML IV SOLN
INTRAVENOUS | Status: AC
  Filled 2019-12-30: qty 5

## 2019-12-30 MED ORDER — DEXAMETHASONE SODIUM PHOSPHATE 10 MG/ML IJ SOLN
INTRAMUSCULAR | Status: AC
  Filled 2019-12-30: qty 1

## 2019-12-30 MED ORDER — ONDANSETRON HCL 4 MG/2ML IJ SOLN
INTRAMUSCULAR | Status: DC | PRN
  Administered 2019-12-30: 4 mg via INTRAVENOUS

## 2019-12-30 MED ORDER — PHENYLEPHRINE HCL-NACL 10-0.9 MG/250ML-% IV SOLN
INTRAVENOUS | Status: DC | PRN
  Administered 2019-12-30: 20 ug/min via INTRAVENOUS

## 2019-12-30 MED ORDER — ADULT MULTIVITAMIN W/MINERALS CH
1.0000 | ORAL_TABLET | Freq: Every day | ORAL | Status: DC
  Administered 2019-12-30 – 2019-12-31 (×2): 1 via ORAL
  Filled 2019-12-30 (×2): qty 1

## 2019-12-30 MED ORDER — POLYETHYLENE GLYCOL 3350 17 G PO PACK
17.0000 g | PACK | Freq: Every day | ORAL | Status: DC
  Administered 2019-12-31: 17 g via ORAL
  Filled 2019-12-30: qty 1

## 2019-12-30 MED ORDER — SODIUM CHLORIDE 0.9 % IV SOLN: INTRAVENOUS | Status: DC

## 2019-12-30 MED ORDER — LACTATED RINGERS IV SOLN: INTRAVENOUS | Status: DC

## 2019-12-30 MED ORDER — SUGAMMADEX SODIUM 200 MG/2ML IV SOLN
INTRAVENOUS | Status: DC | PRN
  Administered 2019-12-30: 100 mg via INTRAVENOUS

## 2019-12-30 MED ORDER — PHENOL 1.4 % MT LIQD: 1.0000 | OROMUCOSAL | Status: DC | PRN

## 2019-12-30 MED ORDER — ACETAMINOPHEN 325 MG PO TABS
325.0000 mg | ORAL_TABLET | ORAL | Status: DC | PRN
  Administered 2019-12-31: 650 mg via ORAL
  Filled 2019-12-30: qty 2

## 2019-12-30 MED ORDER — PROPOFOL 10 MG/ML IV BOLUS
INTRAVENOUS | Status: AC
  Filled 2019-12-30: qty 20

## 2019-12-30 MED ORDER — MAGNESIUM SULFATE 2 GM/50ML IV SOLN: 2.0000 g | Freq: Every day | INTRAVENOUS | Status: DC | PRN

## 2019-12-30 MED ORDER — PROPOFOL 10 MG/ML IV BOLUS
INTRAVENOUS | Status: DC | PRN
  Administered 2019-12-30: 100 mg via INTRAVENOUS

## 2019-12-30 MED ORDER — GUAIFENESIN-DM 100-10 MG/5ML PO SYRP: 15.0000 mL | ORAL_SOLUTION | ORAL | Status: DC | PRN

## 2019-12-30 MED ORDER — FENTANYL CITRATE (PF) 100 MCG/2ML IJ SOLN
INTRAMUSCULAR | Status: DC | PRN
  Administered 2019-12-30 (×3): 25 ug via INTRAVENOUS
  Administered 2019-12-30: 100 ug via INTRAVENOUS
  Administered 2019-12-30: 25 ug via INTRAVENOUS
  Administered 2019-12-30: 50 ug via INTRAVENOUS

## 2019-12-30 MED ORDER — HEPARIN SODIUM (PORCINE) 1000 UNIT/ML IJ SOLN
INTRAMUSCULAR | Status: DC | PRN
  Administered 2019-12-30: 6000 [IU] via INTRAVENOUS

## 2019-12-30 MED ORDER — SODIUM CHLORIDE 0.9 % IV SOLN
INTRAVENOUS | Status: DC | PRN
  Administered 2019-12-30: 500 mL

## 2019-12-30 MED ORDER — DOCUSATE SODIUM 100 MG PO CAPS
100.0000 mg | ORAL_CAPSULE | Freq: Two times a day (BID) | ORAL | Status: DC
  Administered 2019-12-30 – 2019-12-31 (×2): 100 mg via ORAL
  Filled 2019-12-30 (×2): qty 1

## 2019-12-30 MED ORDER — FOLIC ACID 1 MG PO TABS
1.0000 mg | ORAL_TABLET | Freq: Every day | ORAL | Status: DC
  Administered 2019-12-30 – 2019-12-31 (×2): 1 mg via ORAL
  Filled 2019-12-30 (×2): qty 1

## 2019-12-30 MED ORDER — PROTAMINE SULFATE 10 MG/ML IV SOLN
INTRAVENOUS | Status: DC | PRN
  Administered 2019-12-30 (×2): 20 mg via INTRAVENOUS
  Administered 2019-12-30: 10 mg via INTRAVENOUS

## 2019-12-30 SURGICAL SUPPLY — 63 items
ADH SKN CLS APL DERMABOND .7 (GAUZE/BANDAGES/DRESSINGS) ×3
BANDAGE ESMARK 6X9 LF (GAUZE/BANDAGES/DRESSINGS) IMPLANT
BNDG CMPR 9X6 STRL LF SNTH (GAUZE/BANDAGES/DRESSINGS)
BNDG ESMARK 6X9 LF (GAUZE/BANDAGES/DRESSINGS)
CANISTER SUCT 3000ML PPV (MISCELLANEOUS) ×3 IMPLANT
CANNULA VESSEL 3MM 2 BLNT TIP (CANNULA) ×2 IMPLANT
CLIP VESOCCLUDE MED 24/CT (CLIP) ×3 IMPLANT
CLIP VESOCCLUDE SM WIDE 24/CT (CLIP) ×5 IMPLANT
COVER WAND RF STERILE (DRAPES) ×3 IMPLANT
CUFF TOURN SGL QUICK 24 (TOURNIQUET CUFF)
CUFF TOURN SGL QUICK 34 (TOURNIQUET CUFF)
CUFF TOURN SGL QUICK 42 (TOURNIQUET CUFF) IMPLANT
CUFF TRNQT CYL 24X4X16.5-23 (TOURNIQUET CUFF) IMPLANT
CUFF TRNQT CYL 34X4.125X (TOURNIQUET CUFF) IMPLANT
DERMABOND ADVANCED (GAUZE/BANDAGES/DRESSINGS) ×6
DERMABOND ADVANCED .7 DNX12 (GAUZE/BANDAGES/DRESSINGS) ×1 IMPLANT
DRAIN CHANNEL 15F RND FF W/TCR (WOUND CARE) IMPLANT
DRAPE C-ARM 42X72 X-RAY (DRAPES) IMPLANT
DRAPE HALF SHEET 40X57 (DRAPES) IMPLANT
DRAPE X-RAY CASS 24X20 (DRAPES) IMPLANT
ELECT REM PT RETURN 9FT ADLT (ELECTROSURGICAL) ×3
ELECTRODE REM PT RTRN 9FT ADLT (ELECTROSURGICAL) ×1 IMPLANT
EVACUATOR SILICONE 100CC (DRAIN) IMPLANT
GAUZE SPONGE 4X4 16PLY XRAY LF (GAUZE/BANDAGES/DRESSINGS) ×2 IMPLANT
GLOVE BIO SURGEON STRL SZ 6.5 (GLOVE) ×4 IMPLANT
GLOVE BIO SURGEON STRL SZ7.5 (GLOVE) ×3 IMPLANT
GLOVE BIO SURGEONS STRL SZ 6.5 (GLOVE) ×4
GLOVE BIOGEL PI IND STRL 6 (GLOVE) IMPLANT
GLOVE BIOGEL PI IND STRL 6.5 (GLOVE) IMPLANT
GLOVE BIOGEL PI IND STRL 7.0 (GLOVE) IMPLANT
GLOVE BIOGEL PI INDICATOR 6 (GLOVE) ×8
GLOVE BIOGEL PI INDICATOR 6.5 (GLOVE) ×8
GLOVE BIOGEL PI INDICATOR 7.0 (GLOVE) ×4
GOWN STRL REUS W/ TWL LRG LVL3 (GOWN DISPOSABLE) ×2 IMPLANT
GOWN STRL REUS W/ TWL XL LVL3 (GOWN DISPOSABLE) ×1 IMPLANT
GOWN STRL REUS W/TWL LRG LVL3 (GOWN DISPOSABLE) ×15
GOWN STRL REUS W/TWL XL LVL3 (GOWN DISPOSABLE) ×3
HEMOSTAT SNOW SURGICEL 2X4 (HEMOSTASIS) IMPLANT
INSERT FOGARTY SM (MISCELLANEOUS) IMPLANT
KIT BASIN OR (CUSTOM PROCEDURE TRAY) ×3 IMPLANT
KIT TURNOVER KIT B (KITS) ×3 IMPLANT
MARKER GRAFT CORONARY BYPASS (MISCELLANEOUS) IMPLANT
NS IRRIG 1000ML POUR BTL (IV SOLUTION) ×6 IMPLANT
PACK PERIPHERAL VASCULAR (CUSTOM PROCEDURE TRAY) ×3 IMPLANT
PAD ARMBOARD 7.5X6 YLW CONV (MISCELLANEOUS) ×6 IMPLANT
SET COLLECT BLD 21X3/4 12 (NEEDLE) IMPLANT
STOPCOCK 4 WAY LG BORE MALE ST (IV SETS) IMPLANT
SUT ETHILON 3 0 PS 1 (SUTURE) IMPLANT
SUT MNCRL AB 4-0 PS2 18 (SUTURE) ×10 IMPLANT
SUT PROLENE 5 0 C 1 24 (SUTURE) ×17 IMPLANT
SUT PROLENE 6 0 BV (SUTURE) ×11 IMPLANT
SUT PROLENE 7 0 BV 1 (SUTURE) IMPLANT
SUT SILK 2 0 SH (SUTURE) ×3 IMPLANT
SUT SILK 3 0 (SUTURE) ×6
SUT SILK 3-0 18XBRD TIE 12 (SUTURE) IMPLANT
SUT VIC AB 2-0 CT1 27 (SUTURE) ×6
SUT VIC AB 2-0 CT1 TAPERPNT 27 (SUTURE) ×2 IMPLANT
SUT VIC AB 3-0 SH 27 (SUTURE) ×9
SUT VIC AB 3-0 SH 27X BRD (SUTURE) ×2 IMPLANT
TOWEL GREEN STERILE (TOWEL DISPOSABLE) ×3 IMPLANT
TRAY FOLEY MTR SLVR 16FR STAT (SET/KITS/TRAYS/PACK) ×3 IMPLANT
UNDERPAD 30X30 (UNDERPADS AND DIAPERS) ×3 IMPLANT
WATER STERILE IRR 1000ML POUR (IV SOLUTION) ×3 IMPLANT

## 2019-12-30 NOTE — Anesthesia Procedure Notes (Signed)
Procedure Name: Intubation Date/Time: 12/30/2019 9:26 AM Performed by: Candis Shine, CRNA Pre-anesthesia Checklist: Patient identified, Emergency Drugs available, Suction available and Patient being monitored Patient Re-evaluated:Patient Re-evaluated prior to induction Oxygen Delivery Method: Circle System Utilized Preoxygenation: Pre-oxygenation with 100% oxygen Induction Type: IV induction Ventilation: Mask ventilation without difficulty Laryngoscope Size: Mac and 3 Grade View: Grade I Tube type: Oral Tube size: 7.5 mm Number of attempts: 1 Airway Equipment and Method: Stylet Placement Confirmation: ETT inserted through vocal cords under direct vision,  positive ETCO2 and breath sounds checked- equal and bilateral Secured at: 22 cm Tube secured with: Tape Dental Injury: Teeth and Oropharynx as per pre-operative assessment

## 2019-12-30 NOTE — Transfer of Care (Signed)
Immediate Anesthesia Transfer of Care Note  Patient: Terrence Carr  Procedure(s) Performed: RIGHT FEMORAL-POPLITEAL ARTERY BYPASS GRAFT (Right Leg Upper)  Patient Location: PACU  Anesthesia Type:General  Level of Consciousness: awake and alert   Airway & Oxygen Therapy: Patient Spontanous Breathing and Patient connected to face mask oxygen  Post-op Assessment: Report given to RN and Post -op Vital signs reviewed and stable  Post vital signs: Reviewed and stable  Last Vitals:  Vitals Value Taken Time  BP 122/69 12/30/19 1241  Temp    Pulse 71 12/30/19 1246  Resp 11 12/30/19 1242  SpO2 100 % 12/30/19 1246  Vitals shown include unvalidated device data.  Last Pain:  Vitals:   12/30/19 0800  TempSrc:   PainSc: 0-No pain      Patients Stated Pain Goal: 0 ([id number])  Complications: No apparent anesthesia complications

## 2019-12-30 NOTE — Progress Notes (Signed)
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  Ongoing   GOAL:   Patient will meet greater than or equal to 90% of their needs  Progressing   MONITOR:   PO intake, Labs, I & O's, Supplement acceptance, Weight trends, Skin  REASON FOR ASSESSMENT:   Consult Wound healing  ASSESSMENT:  RD working remotely.  58 year old male with past medical history of HTN, peripheral arterial occlusive disease, alcohol abuse, peripheral neuropathy, vitamin B12 deficiency, 9/02 right femoral endarterectomy and right iliac artery stent at [facility] vascular, and history of severe protein calorie malnutrition who presents to ED with 3 week history of progressive and worsening wounds to bilateral lower extremities with yellowish malodorous discharge and severe pain. In ED,alcohol level 23; right tibia and fibula showed diffuse edematous thickening of the RLE with large ankle effusion worrisome for osteomyelitis; left foot x-ray showed soft tissue ulceration along plantar aspect of heel and patient [facility] to [facility] vascular surgery evaluation.   12/24- s/p L AKA  12/28- s/p aortogram, R fem artery/R LE angiography, cannulation L fem 12/30- s/p R fem endart, R fem-pop bypass  Pt in surgery at time of RD visit.   Meal completions charted as 50-100% for pt's last eight meals. Per flowsheet pt drinking Juven, unsure of Ensure. Suspect pt's malnutrition continues. Continue current interventions. Will attempt to obtain more history and NFPE if possible.   Admission weight: 74.8 kg   Current weight: 55.3 kg   I/O: +613 ml since admit  UOP: 2,290 ml x 24 hrs    Drips: NS @ 75 ml/hr  Medications: colace, folic acid, miralax, thiamine, Vit B12 Labs: CBG 100-111   Diet Order:   Diet Order            Diet regular Room service appropriate? Yes; Fluid consistency: Thin  Diet effective now              EDUCATION NEEDS:   Not appropriate for education at this time  Skin:  Skin Assessment: Skin Integrity Issues: Skin Integrity Issues:: Stage II, Incisions, Other (Comment) Stage II: buttocks Incisions: L chest, L leg, R groin, R leg Other: R pretibial wound  Last BM:  12/29  Height:   Ht Readings from Last 1 Encounters:  12/24/19 5' 5.98" (1.676 m)    Weight:   Wt Readings from Last 1 Encounters:  12/30/19 55.3 kg    Ideal Body Weight:  58 kg(Adjusted for AKA)  BMI:  Body mass index is 19.69 kg/m.  Estimated Nutritional Needs:   Kcal:  2000-2200  Protein:  102-114  Fluid:  >/= 2 L/day  Mariana Single RD, LDN Clinical Nutrition Pager # - [phone]

## 2019-12-30 NOTE — Progress Notes (Signed)
  Progress Note    12/30/2019 8:59 AM Day of Surgery  Subjective:  No overnight issues  Vitals:   12/29/19 1945 12/30/19 0405  BP: 114/62 131/66  Pulse: 73 70  Resp:    Temp: 98.9 F (37.2 C) 98.4 F (36.9 C)  SpO2: 99% 96%    Physical Exam: aaox3 Non labored respirations Right foot wound stable  CBC    Component Value Date/Time   WBC 6.6 12/30/2019 0319   RBC 2.95 (L) 12/30/2019 0319   HGB 9.4 (L) 12/30/2019 0319   HCT 27.7 (L) 12/30/2019 0319   HCT 24.7 (L) 12/24/2019 0419   PLT 531 (H) 12/30/2019 0319   MCV 93.9 12/30/2019 0319   MCH 31.9 12/30/2019 0319   MCHC 33.9 12/30/2019 0319   RDW 18.2 (H) 12/30/2019 0319   LYMPHSABS 0.7 12/24/2019 0753   MONOABS 0.5 12/24/2019 0753   EOSABS 0.0 12/24/2019 0753   BASOSABS 0.0 12/24/2019 0753    BMET    Component Value Date/Time   NA 136 12/30/2019 0319   K 4.1 12/30/2019 0319   CL 103 12/30/2019 0319   CO2 25 12/30/2019 0319   GLUCOSE 100 (H) 12/30/2019 0319   BUN 10 12/30/2019 0319   CREATININE 0.68 12/30/2019 0319   CALCIUM 8.3 (L) 12/30/2019 0319   GFRNONAA >60 12/30/2019 0319   GFRAA >60 12/30/2019 0319    INR    Component Value Date/Time   INR 0.9 12/30/2019 0319     Intake/Output Summary (Last 24 hours) at 12/30/2019 0859 Last data filed at 12/30/2019 0400 Gross per 24 hour  Intake 1980 ml  Output 2050 ml  Net -70 ml     Assessment:  58 y.o. male is s/p right lower extremity angiogram with rle wound.  Plan: OR today for right fem-popliteal bypass   Nychelle Cassata C. Donzetta Matters, MD [facility] Office: [phone] Pager: [phone]  12/30/2019 8:59 AM

## 2019-12-30 NOTE — Progress Notes (Signed)
OT Cancellation Note  Patient Details Name: Terrence Carr MRN: [id number] DOB: [birth year]   Cancelled Treatment:    Reason Eval/Treat Not Completed: Patient at procedure or test/ unavailable;Other (comment)  Pt in OR for RLE fem-pop bypass. Will check back as time allows for OT session.   Lanier Clam., COTA/L Acute Rehabilitation Services [phone] [place] 12/30/2019, 10:34 AM

## 2019-12-30 NOTE — Progress Notes (Addendum)
PROGRESS NOTE  TAIDYN BOESCH [id number] DOB: Oct 22, 1961 DOA: 12/23/2019 PCP: Patient, No Pcp Per   LOS: 7 days   Brief narrative: Patient is a 58 year old African-American male with past medical history significant for hypertension, peripheral vascular disease, tobacco abuse, alcohol abuse, peripheral neuropathy, B12 deficiencyandsevere protein calorie malnutrition presented to the hospital with bilateral leg wound and pain progressively worsening over 3 weeks. He was found to have extensive gangrene of left foot. 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  He also had wounds over the right lower extremity. On 12/29, Patient noted to be anemic and received PRBC transfusion.    Patient underwent right lower extremity angiogram and there was subsequently plan for right femoropopliteal bypass.  Assessment/Plan:  Active Problems:   Osteomyelitis of ankle or foot, acute, right (HCC)   Lactic acidosis   Alcohol abuse   Alcohol withdrawal (HCC)   Macrocytic anemia   Sacral decubitus ulcer   Hyperglycemia   Essential hypertension   Peripheral arterial disease (HCC)   Vitamin B12 deficiency   Thrombocytosis (HCC)   Hypoglycemia  Peripheral vascular disease,Critical limb ischemia bilaterally, Left foot gangrene. Osteomyelitis. 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 Curt Jews.  Tolerated well.  Patient had significant lactic acidosis at the time of admission secondary to hypoperfusion.  Patient had arteriogram to the right lower extremity on 12/29 and is awaiting femoropopliteal bypass 12/30/2019.  Right lower extremity wounds.  Pretibial and foot lesion. plan for femoral-popliteal bypass 12/30/2019.  Wound care for dressing.  Alcohol abuse/ alcohol withdrawal.  Continue CIWA protocol.  Continue thiamine.  No signs of withdrawal at this time.  Acute on chronic macrocytic anemia.  Severe vitamin B12 deficiency.  Continue B12 injection subcutaneously followed by oral.   Continue folic acid.  Status post 2 units of packed RBC transfusion for hemoglobin of 6.7.  Hemoglobin today is 9.4.Marland Kitchen  Thrombocytosis.  Reactive.  Severe protein calorie malnutrition.  Present on admission.  Likely secondary to alcohol abuse.  Continue nutritional support   Left buttocks pressure ulceration stage II present on admission.  Continue preventive protocol and dressing.   VTE Prophylaxis: Lovenox  Code Status: Full  Family Communication: Spoke with patient in detail. I tried to call the patient's contact  Amedeo Kinsman on home and cell phone but was unable to reach.  Disposition Plan: Plans for CIR.  CIR has been consulted.  Awaiting for femoropopliteal bypass today.  Consultants:  Vascular surgery  Procedures:  Left AKA  Right lower extremity arteriogram 12/28  Packed RBC transfusion.  Antibiotics:  Anti-infectives (From admission, onward)   Start     Dose/Rate Route Frequency Ordered Stop   12/30/19 0600  [MAR Hold]  cefUROXime (ZINACEF) 1.5 g in sodium chloride 0.9 % 100 mL IVPB     (MAR Hold since Wed 12/30/2019 at 0830.Hold Reason: Transfer to a Procedural area.)   1.5 g 200 mL/hr over 30 Minutes Intravenous To Short Stay 12/29/19 0731 12/31/19 0600   12/29/19 2000  [MAR Hold]  vancomycin (VANCOREADY) IVPB 750 mg/150 mL     (MAR Hold since Wed 12/30/2019 at 0830.Hold Reason: Transfer to a Procedural area.)   750 mg 150 mL/hr over 60 Minutes Intravenous Every 12 hours 12/29/19 0842     12/24/19 0800  vancomycin (VANCOCIN) IVPB 1000 mg/200 mL premix  Status:  Discontinued     1,000 mg 200 mL/hr over 60 Minutes Intravenous Every 12 hours 12/23/19 1950 12/29/19 0842   12/24/19 0500  ceFEPIme (MAXIPIME) 2  g in sodium chloride 0.9 % 100 mL IVPB  Status:  Discontinued     2 g 200 mL/hr over 30 Minutes Intravenous Every 8 hours 12/24/19 0017 12/26/19 0750   12/23/19 2045  ceFEPIme (MAXIPIME) 2 g in sodium chloride 0.9 % 100 mL IVPB     2 g 200 mL/hr over 30  Minutes Intravenous  Once 12/23/19 2036 12/23/19 2148   12/23/19 1930  ceFAZolin (ANCEF) IVPB 1 g/50 mL premix  Status:  Discontinued     2 g 200 mL/hr over 30 Minutes Intravenous  Once 12/23/19 1918 12/23/19 2036   12/23/19 1900  vancomycin (VANCOREADY) IVPB 1500 mg/300 mL     1,500 mg 150 mL/hr over 120 Minutes Intravenous  Once 12/23/19 1857 12/23/19 2231      Subjective: Today, patient complains of constipation for the last several days.  Denies overt pain.  Denies any fever, nausea, vomiting or chills.  Objective: Vitals:   12/29/19 1945 12/30/19 0405  BP: 114/62 131/66  Pulse: 73 70  Resp:    Temp: 98.9 F (37.2 C) 98.4 F (36.9 C)  SpO2: 99% 96%    Intake/Output Summary (Last 24 hours) at 12/30/2019 0936 Last data filed at 12/30/2019 0400 Gross per 24 hour  Intake 1980 ml  Output 2050 ml  Net -70 ml   Filed Weights   12/28/19 0500 12/29/19 0429 12/30/19 0420  Weight: 55.1 kg 56.3 kg 55.3 kg   Body mass index is 19.69 kg/m.   Physical Exam: GENERAL: Patient is alert awake and oriented. Not in obvious distress.  Thinly built. HENT: No scleral pallor or icterus. Pupils equally reactive to light. Oral mucosa is moist NECK: is supple, no palpable thyroid enlargement. CHEST: Clear to auscultation. No crackles or wheezes. Non tender on palpation. Diminished breath sounds bilaterally. CVS: S1 and S2 heard, no murmur. Regular rate and rhythm. No pericardial rub. ABDOMEN: Soft, non-tender, bowel sounds are present.  Condom cath in place. EXTREMITIES: Left above-knee amputation with dressing..  Right leg ulcerswith dressing. CNS: Cranial nerves are intact. No focal motor or sensory deficits. SKIN: Left above-knee amputation.  Right leg with ulcers with dressing.  Data Review: I have personally reviewed the following laboratory data and studies,  CBC: Recent Labs  Lab 12/23/19 1854 12/24/19 0753 12/27/19 0535 12/28/19 0447 12/29/19 0248 12/29/19 1959  12/30/19 0319  WBC 7.1 7.1 5.3 5.2 5.5 6.8 6.6  NEUTROABS 5.3 5.9  --   --   --   --   --   HGB 9.7* 8.3* 7.7* 7.6* 6.7* 9.7* 9.4*  HCT 29.8* 24.5* 23.4* 23.0* 20.1* 28.0* 27.7*  MCV 101.4* 97.2 99.6 99.6 98.0 93.3 93.9  PLT 533* 467* 495* 452* 421* 500* 0000000*   Basic Metabolic Panel: Recent Labs  Lab 12/24/19 0419 12/24/19 0753 12/25/19 0359 12/26/19 0727 12/27/19 0535 12/28/19 0447 12/29/19 0248 12/30/19 0319  NA  --  130* 134* 131* 134* 132* 135 136  K  --  3.9 3.9 3.9 3.9 4.2 3.9 4.1  CL  --  97* 101 101 99 98 101 103  CO2  --  22 24 21* 26 27 25 25   GLUCOSE  --  100* 100* 145* 102* 103* 111* 100*  BUN  --  8 6 6  5* 6 8 10   CREATININE  --  0.71 0.70 0.70 0.76 0.63 0.78 0.68  CALCIUM  --  7.9* 7.8* 7.6* 8.1* 8.1* 7.9* 8.3*  MG 1.9 2.0 1.9  --   --   --   --   --  PHOS 3.2  --   --   --   --   --   --   --    Liver Function Tests: Recent Labs  Lab 12/24/19 0753  AST 49*  ALT 21  ALKPHOS 189*  BILITOT 1.0  PROT 5.3*  ALBUMIN 1.6*   No results for input(s): LIPASE, AMYLASE in the last 168 hours. No results for input(s): AMMONIA in the last 168 hours. Cardiac Enzymes: No results for input(s): CKTOTAL, CKMB, CKMBINDEX, TROPONINI in the last 168 hours. BNP (last 3 results) No results for input(s): BNP in the last 8760 hours.  ProBNP (last 3 results) No results for input(s): PROBNP in the last 8760 hours.  CBG: Recent Labs  Lab 12/23/19 2132 12/23/19 2312 12/24/19 0640  GLUCAP 62* 128* 89   Recent Results (from the past 240 hour(s))  Respiratory Panel by RT PCR (Flu A&B, Covid) - Nasopharyngeal Swab     Status: None   Collection Time: 12/23/19  8:55 PM   Specimen: Nasopharyngeal Swab  Result Value Ref Range Status   SARS Coronavirus 2 by RT PCR NEGATIVE NEGATIVE Final    Comment: (NOTE) SARS-CoV-2 target nucleic acids are NOT DETECTED. The SARS-CoV-2 RNA is generally detectable in upper respiratoy specimens during the acute phase of infection. The  lowest concentration of SARS-CoV-2 viral copies this assay can detect is 131 copies/mL. A negative result does not preclude SARS-Cov-2 infection and should not be used as the sole basis for treatment or other patient management decisions. A negative result may occur with  improper specimen collection/handling, submission of specimen other than nasopharyngeal swab, presence of viral mutation(s) within the areas targeted by this assay, and inadequate number of viral copies (<131 copies/mL). A negative result must be combined with clinical observations, patient history, and epidemiological information. The expected result is Negative. Fact Sheet for Patients:  PinkCheek.be Fact Sheet for Healthcare Providers:  GravelBags.it This test is not yet ap proved or cleared by the [place] FDA and  has been authorized for detection and/or diagnosis of SARS-CoV-2 by FDA under an Emergency Use Authorization (EUA). This EUA will remain  in effect (meaning this test can be used) for the duration of the COVID-19 declaration under Section 564(b)(1) of the Act, 21 U.S.C. section 360bbb-3(b)(1), unless the authorization is terminated or revoked sooner.    Influenza A by PCR NEGATIVE NEGATIVE Final   Influenza B by PCR NEGATIVE NEGATIVE Final    Comment: (NOTE) The Xpert Xpress SARS-CoV-2/FLU/RSV assay is intended as an aid in  the diagnosis of influenza from Nasopharyngeal swab specimens and  should not be used as a sole basis for treatment. Nasal washings and  aspirates are unacceptable for Xpert Xpress SARS-CoV-2/FLU/RSV  testing. Fact Sheet for Patients: PinkCheek.be Fact Sheet for Healthcare Providers: GravelBags.it This test is not yet approved or cleared by the [place] FDA and  has been authorized for detection and/or diagnosis of SARS-CoV-2 by  FDA under an Emergency  Use Authorization (EUA). This EUA will remain  in effect (meaning this test can be used) for the duration of the  Covid-19 declaration under Section 564(b)(1) of the Act, 21  U.S.C. section 360bbb-3(b)(1), unless the authorization is  terminated or revoked. Performed at [facility], [address], [place], [place] [address]   Surgical pcr screen     Status: Abnormal   Collection Time: 12/24/19  1:31 AM   Specimen: Nasal Mucosa; Nasal Swab  Result Value Ref Range Status   MRSA, PCR  POSITIVE (A) NEGATIVE Final    Comment: RESULT CALLED TO, READ BACK BY AND VERIFIED WITH: A NEVIUS RN 12/24/19 0524 JDW    Staphylococcus aureus POSITIVE (A) NEGATIVE Final    Comment: (NOTE) The Xpert SA Assay (FDA approved for NASAL specimens in patients 79 years of age and older), is one component of a comprehensive surveillance program. It is not intended to diagnose infection nor to guide or monitor treatment. Performed at [facility], [place] [address] [place], [place] [address]      Studies: PERIPHERAL VASCULAR CATHETERIZATION  Result Date: 12/28/2019 Patient name: NADIR KENASTON MRN: [id number] DOB: [birth year] Sex: male 12/28/2019 Pre-operative Diagnosis: Critical right lower extremity ischemia with wounds Post-operative diagnosis:  Same Surgeon:  Erlene Quan C. Donzetta Matters, MD Procedure Performed: 1.  Ultrasound-guided cannulation left common femoral artery 2.  Aortogram 3.  Selection of right common femoral artery and right lower extremity angiography 4.  Moderate sedation with fentanyl and Versed for 22 minutes Indications: 58 year old male presented with bilateral lower extremity ischemia.  He is undergone left above-knee amputation.  He is now indicated for right lower extremity angiography possible intervention. Findings: Aorta and iliac segments are free of flow-limiting stenosis.  The right side common iliac artery has a stent that appears patent.  Common femoral artery has patent patch.  The SFA is  flush occluded.  He reconstitutes above the knee popliteal artery which is diseased to below the knee.  Does have three-vessel runoff to the foot. He will be considered for right femoral to popliteal artery bypass.  Procedure:  The patient was identified in the holding area and taken to room 8.  The patient was then placed supine on the table and prepped and draped in the usual sterile fashion.  A time out was called.  Ultrasound was used to evaluate the left common femoral artery.  There is anesthetized 1% lidocaine cannulated with direct ultrasound visualization a micropuncture needle followed by wire sheath.  Bentson wire was placed.  And images saved the permanent record of the ultrasound-guided cannulation site.  5 French sheath was placed.  Omni catheter placed to level L1 aortogram performed.  We crossed bifurcation perform right lower extremity angiography.  With above findings patient will need bypass surgery.  He remains high risk for amputation.  Catheter was removed over wire.  Sheath will be pulled in postoperative holding.  He tolerated procedure well without any complication Contrast: 65 cc Brandon C. Donzetta Matters, MD [facility] Office: [phone] Pager: [phone]  VAS [place] LOWER EXTREMITY SAPHENOUS VEIN MAPPING  Result Date: 12/29/2019 LOWER EXTREMITY VEIN MAPPING Indications:  Pre-op Risk Factors: PAD.  Comparison Study: No prior study Performing Technologist: Sharion Dove RVS  Examination Guidelines: A complete evaluation includes B-mode imaging, spectral Doppler, color Doppler, and power Doppler as needed of all accessible portions of each vessel. Bilateral testing is considered an integral part of a complete examination. Limited examinations for reoccurring indications may be performed as noted. +---------------+-----------+----------------------+---------------+-----------+   RT Diameter  RT Findings         GSV            LT Diameter  LT Findings       (cm)                                            (cm)                  +---------------+-----------+----------------------+---------------+-----------+  0.49                     Saphenofemoral                                                                Junction                                  +---------------+-----------+----------------------+---------------+-----------+      0.38                     Proximal thigh                               +---------------+-----------+----------------------+---------------+-----------+      0.38       branching       Mid thigh                                  +---------------+-----------+----------------------+---------------+-----------+      0.38                      Distal thigh                                +---------------+-----------+----------------------+---------------+-----------+      0.22                          Knee                                    +---------------+-----------+----------------------+---------------+-----------+      0.26       branching       Prox calf                                  +---------------+-----------+----------------------+---------------+-----------+      0.24       branching        Mid calf                                  +---------------+-----------+----------------------+---------------+-----------+      0.24                      Distal calf                                 +---------------+-----------+----------------------+---------------+-----------+      0.21                         Ankle                                    +---------------+-----------+----------------------+---------------+-----------+  Diagnosing physician: Ruta Hinds MD Electronically signed by Ruta Hinds MD on 12/29/2019 at 1:46:13 PM.    Final     Scheduled Meds: . [MAR Hold] aspirin EC  81 mg Oral Daily  . [MAR Hold] feeding supplement (ENSURE ENLIVE)  237 mL Oral BID  BM  . [MAR Hold] gabapentin  100 mg Oral TID  . [MAR Hold] heparin  5,000 Units Subcutaneous Q8H  . [MAR Hold] multivitamin  1 tablet Oral Daily  . [MAR Hold] nutrition supplement (JUVEN)  1 packet Oral BID BM  . [MAR Hold] sodium chloride flush  3 mL Intravenous Q12H  . [MAR Hold] thiamine  100 mg Oral Daily  . [MAR Hold] vitamin B-12  1,000 mcg Oral Daily    Continuous Infusions: . sodium chloride Stopped (12/28/19 0857)  . [MAR Hold] sodium chloride    . [MAR Hold] cefUROXime (ZINACEF)  IV    . lactated ringers 10 mL/hr at 12/30/19 0856  . [MAR Hold] vancomycin 750 mg (12/30/19 0801)     Flora Lipps, MD  [facility] 12/30/2019

## 2019-12-30 NOTE — Anesthesia Preprocedure Evaluation (Addendum)
Anesthesia Evaluation  Patient identified by MRN, date of birth, ID band Patient awake    Reviewed: Allergy & Precautions, NPO status , Patient's Chart, lab work & pertinent test results  Airway Mallampati: I  TM Distance: >3 FB Neck ROM: Full    Dental  (+) Missing, Poor Dentition, Chipped, Dental Advisory Given,    Pulmonary neg pulmonary ROS,     + decreased breath sounds      Cardiovascular hypertension, Pt. on medications and Pt. on home beta blockers + Peripheral Vascular Disease   Rhythm:Regular Rate:Normal     Neuro/Psych PSYCHIATRIC DISORDERS negative neurological ROS     GI/Hepatic negative GI ROS, Neg liver ROS,   Endo/Other  negative endocrine ROS  Renal/GU negative Renal ROS     Musculoskeletal negative musculoskeletal ROS (+)   Abdominal Normal abdominal exam  (+)   Peds  Hematology negative hematology ROS (+)   Anesthesia Other Findings   Reproductive/Obstetrics                            Anesthesia Physical Anesthesia Plan  ASA: III  Anesthesia Plan: General   Post-op Pain Management:    Induction: Intravenous  PONV Risk Score and Plan: 3 and Ondansetron, Dexamethasone and Midazolam  Airway Management Planned: Oral ETT  Additional Equipment: None  Intra-op Plan:   Post-operative Plan: Extubation in OR  Informed Consent: I have reviewed the patients History and Physical, chart, labs and discussed the procedure including the risks, benefits and alternatives for the proposed anesthesia with the patient or authorized representative who has indicated his/her understanding and acceptance.     Dental advisory given  Plan Discussed with: CRNA  Anesthesia Plan Comments:        Anesthesia Quick Evaluation

## 2019-12-30 NOTE — H&P (Addendum)
Physical Medicine and Rehabilitation Admission H&P    Chief Complaint  Patient presents with   Wound Check  : HPI: Terrence Carr is a 58 year old R handed male with history of hypertension, tobacco and alcohol use, vitamin B12 deficiency and peripheral vascular disease with right femoral enterectomy and right iliac artery stent placement by Dr. Radene Knee in March 2020 maintained on Plavix.  Per chart review patient lives with girlfriend stepdaughter and her boyfriend.  Independent with assistive device prior to admission.  1 level home with one-step to entry.  Family provides assistance as needed.  Presented 12/23/2019 to [facility] with acute onset progressive worsening of wounds to bilateral lower extremities x3 weeks left greater than right.  X-rays concerning for osteomyelitis at the base of the first distal phalanx of the right foot as well as left distal fibular metadiaphysis and was started on IV antibiotics.  He was transferred to [facility] for further evaluation.  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 Donnetta Hutching.  Patient with follow-up evaluation of right lower extremity.  ABIs nondetectable.  Underwent right lower extremity angiogram subsequently followed by right common femoral profundofemoral enterectomy, right common femoral to below-knee popliteal artery bypass with ipsilateral, translocated nonreversed greater saphenous vein 12/30/2019 per Dr. Donzetta Matters.  Hospital course pain management.  Subcutaneous heparin for DVT prophylaxis.  Hospital course anemia 6.7 transfuse latest hemoglobin 8.4.  Noted left buttocks pressure ulceration stage II with wound care as directed.  Therapy evaluations completed and patient was admitted for a comprehensive rehab program.  Pain "pretty good"- sitting up- better with sitting up. Pain meds work well- but is "due now".     Review of Systems    Constitutional: Negative for chills and fever.       Poor appetite  HENT: Negative for hearing loss.   Eyes: Negative for blurred vision and double vision.  Respiratory: Negative for cough and shortness of breath.   Cardiovascular: Positive for leg swelling. Negative for chest pain and palpitations.  Gastrointestinal: Positive for constipation. Negative for heartburn, nausea and vomiting.  Genitourinary: Positive for urgency. Negative for dysuria, flank pain and hematuria.  Musculoskeletal: Positive for joint pain and myalgias.  Skin: Negative for rash.  All other systems reviewed and are negative.  Past Medical History:  Diagnosis Date   Hypertension    Past Surgical History:  Procedure Laterality Date   AMPUTATION Left 12/24/2019   Procedure: LEFT ABOVE KNEE AMPUTATION;  Surgeon: Rosetta Posner, MD;  Location: [place];  Service: Vascular;  Laterality: Left;   LOWER EXTREMITY ANGIOGRAPHY Right 12/28/2019   Procedure: LOWER EXTREMITY ANGIOGRAPHY;  Surgeon: Waynetta Sandy, MD;  Location: [place] [facility];  Service: Cardiovascular;  Laterality: Right;   History reviewed. No pertinent family history. Social History:  reports that he has never smoked. He has never used smokeless tobacco. He reports that he does not drink alcohol or use drugs. Actually said was smoking 1/2 ppd and drinking 1/2 pint at least of vodka daily since retired. Allergies:  Allergies  Allergen Reactions   Ibuprofen Other (See Comments)    Patient said "the little brown pills" that he bought from the store made him talk out of his head (reported by [facility])   Other Other (See Comments)    Unknown reaction to "Dihydroxyalaminum aminoacetate magnesium carbonate" (reported by [facility]   Medications Prior to Admission  Medication  Sig Dispense Refill   Aspirin-Acetaminophen-Caffeine (GOODY HEADACHE PO) Take by mouth.     amLODipine (NORVASC) 5 MG tablet Take 5 mg by mouth  daily.     buPROPion (WELLBUTRIN SR) 100 MG 12 hr tablet Take 100 mg by mouth 2 (two) times daily.     chlorthalidone (HYGROTON) 25 MG tablet Take 25 mg by mouth every morning.     clopidogrel (PLAVIX) 75 MG tablet Take 75 mg by mouth daily.     ferrous sulfate 325 (65 FE) MG tablet Take 325 mg by mouth daily.     folic acid (FOLVITE) 1 MG tablet Take 1 mg by mouth daily.     gabapentin (NEURONTIN) 300 MG capsule Take 600 mg by mouth 3 (three) times daily.     lisinopril (ZESTRIL) 20 MG tablet Take 20 mg by mouth daily.     metoprolol tartrate (LOPRESSOR) 25 MG tablet Take 25 mg by mouth 2 (two) times daily.     Multiple Vitamins-Minerals (THERA-M) TABS Take 1 tablet by mouth daily.     naltrexone (DEPADE) 50 MG tablet Take 50 mg by mouth daily.      Drug Regimen Review Drug regimen was reviewed and remains appropriate with no significant issues identified.  Home: Home Living Family/patient expects to be discharged to:: Private residence Living Arrangements: (girlfriend, Rozella and "stepdtr", Graylon Good and her boyfriend) Available Help at Discharge: Mamie Laurel, grand dtr with pt and his GF when Graylon Good is working) Type of Home: House Home Access: Stairs to enter Technical brewer of Steps: 2(family to build a ramp) Home Layout: One level Bathroom Shower/Tub: Multimedia programmer: Standard Bathroom Accessibility: Yes Home Equipment: Environmental consultant - 4 wheels, Shower seat Additional Comments: patient sponge bathes  Lives With: Significant other, Other (Comment)   Functional History: Prior Function Level of Independence: Independent with assistive device(s) Comments: RW  Functional Status:  Mobility: Bed Mobility Overal bed mobility: Needs Assistance Bed Mobility: Supine to Sit Rolling: Mod assist Sidelying to sit: Mod assist, HOB elevated Supine to sit: Mod assist, HOB elevated General bed mobility comments: +rail, increased time, cues for sequencing, use of bed  pad to scoot to EOB Transfers Overall transfer level: Needs assistance Equipment used: Rolling walker (2 wheeled) Transfers: Sit to/from Stand Sit to Stand: Max assist, +2 physical assistance General transfer comment: Pt with difficulty with fully upright needed cues to try to stand up straight by tucking his buttocks      ADL: ADL Overall ADL's : Needs assistance/impaired Eating/Feeding: Independent, Sitting Grooming: Set up, Sitting Upper Body Bathing: Set up, Sitting Lower Body Bathing: Maximal assistance Lower Body Bathing Details (indicate cue type and reason): Max A +2 sit<>stand Upper Body Dressing : Minimal assistance, Sitting Lower Body Dressing: Total assistance Lower Body Dressing Details (indicate cue type and reason): Max A +2 sit<>stand Toilet Transfer: Maximal assistance, +2 for physical assistance, RW, Stand-pivot Toilet Transfer Details (indicate cue type and reason): Bed>recliner on pt's right ToiletingRunner, broadcasting/film/video and Hygiene: Total assistance Toileting - Clothing Manipulation Details (indicate cue type and reason): Max A +2 sit<>stand  Cognition: Cognition Overall Cognitive Status: Impaired/Different from baseline Orientation Level: Oriented to person, Disoriented to place, Disoriented to time, Disoriented to situation Cognition Arousal/Alertness: Awake/alert Behavior During Therapy: WFL for tasks assessed/performed Overall Cognitive Status: Impaired/Different from baseline Area of Impairment: Safety/judgement Safety/Judgement: Decreased awareness of safety  Physical Exam: Blood pressure 139/75, pulse 77, temperature 98.1 F (36.7 C), resp. rate 13, height 5' 5.98" (1.676 m), weight 55.3 kg,  SpO2 94 %. Physical Exam  Nursing note and vitals reviewed. Constitutional: No distress.  Cachetic, sitting up in chair at bedside, on IV Vanc, NAD, stool under fingernails  HENT:  Head: Normocephalic and atraumatic.  Mouth/Throat: No oropharyngeal  exudate.  Patient with poor dentition  Eyes: Pupils are equal, round, and reactive to light. EOM are normal. No scleral icterus.  Neck: No tracheal deviation present.  Cardiovascular:  Bradycardic but regular rhythm  Respiratory:  CTA B/L, no W/R/R  GI:  Soft, NT, ND< (+) hypoactive BS- pt doesn't remember LBM- but was this AM  Musculoskeletal:     Cervical back: Normal range of motion and neck supple.     Comments: RUE 4+/5- deltoid, biceps, triceps, WE, grip, finger abd  LUE- deltoid 2+/5, otherwise 4+/5 in LUE RLE- at least 3+/5- wouldn't push secondary to "wounds" LLE- HF 3+/5- L AKA L shoulder painful per pt- but nothing specific on palpation- "has been out of socket before".  Neurological: He is alert. No cranial nerve deficit.  Patient is alert.  Is able to write his name and age but has some difficulty with recall he continued to request discharge to home. AO x 2- doesn't know date/day- is a little confused Sensation intact on L AKA Sensation to LT decreased below knee on RLE  Skin: No rash noted. He is not diaphoretic. No erythema.  Left AKA with staples in place no drainage, no erythema- looks great- (+) dog ears.  Bypass sites lower extremities dressed with palpable pulses R shin wound and R top of foot have dressing- wounds bleeding through- also wasn't able to assess wound on L buttock- supposedly is Stage II  Psychiatric:  Bright affect; slightly confused    Results for orders placed or performed during the hospital encounter of 12/23/19 (from the past 48 hour(s))  Basic metabolic panel     Status: Abnormal   Collection Time: 12/29/19  2:48 AM  Result Value Ref Range   Sodium 135 135 - 145 mmol/L   Potassium 3.9 3.5 - 5.1 mmol/L   Chloride 101 98 - 111 mmol/L   CO2 25 22 - 32 mmol/L   Glucose, Bld 111 (H) 70 - 99 mg/dL   BUN 8 6 - 20 mg/dL   Creatinine, Ser 0.78 0.61 - 1.24 mg/dL   Calcium 7.9 (L) 8.9 - 10.3 mg/dL   GFR calc non Af Amer >60 >60 mL/min   GFR  calc Af Amer >60 >60 mL/min   Anion gap 9 5 - 15    Comment: Performed at [facility], [place] [address], [place] [address]  CBC     Status: Abnormal   Collection Time: 12/29/19  2:48 AM  Result Value Ref Range   WBC 5.5 4.0 - 10.5 K/uL   RBC 2.05 (L) 4.22 - 5.81 MIL/uL   Hemoglobin 6.7 (LL) 13.0 - 17.0 g/dL    Comment: REPEATED TO VERIFY THIS CRITICAL RESULT HAS VERIFIED AND BEEN CALLED TO RN KARI DAVIDSON BY MESSAN HOUEGNIFIO ON 12 29 2020 AT 0426, AND HAS BEEN READ BACK.     HCT 20.1 (L) 39.0 - 52.0 %   MCV 98.0 80.0 - 100.0 fL   MCH 32.7 26.0 - 34.0 pg   MCHC 33.3 30.0 - 36.0 g/dL   RDW 16.8 (H) 11.5 - 15.5 %   Platelets 421 (H) 150 - 400 K/uL   nRBC 0.0 0.0 - 0.2 %    Comment: Performed at [facility]  Lab, [address] [place], [place] [address]  Type and screen [place]     Status: None (Preliminary result)   Collection Time: 12/29/19  5:21 AM  Result Value Ref Range   ABO/RH(D) O POS    Antibody Screen NEG    Sample Expiration 01/01/2020,2359    Unit Number [id number]    Blood Component Type RED CELLS,LR    Unit division 00    Status of Unit ALLOCATED    Transfusion Status OK TO TRANSFUSE    Crossmatch Result Compatible    Unit Number [id number]    Blood Component Type RED CELLS,LR    Unit division 00    Status of Unit ALLOCATED    Transfusion Status OK TO TRANSFUSE    Crossmatch Result Compatible    Unit Number [id number]    Blood Component Type RED CELLS,LR    Unit division 00    Status of Unit ISSUED,FINAL    Transfusion Status OK TO TRANSFUSE    Crossmatch Result Compatible    Unit Number [id number]    Blood Component Type RED CELLS,LR    Unit division 00    Status of Unit ISSUED,FINAL    Transfusion Status OK TO TRANSFUSE    Crossmatch Result      Compatible Performed at [facility], [place] [address] [place], [place] [address]   Prepare RBC     Status: None   Collection Time: 12/29/19  5:21 AM   Result Value Ref Range   Order Confirmation      ORDER PROCESSED BY BLOOD BANK Performed at [facility], [place] [address], [place], [place] [address]   Occult blood card to lab, stool RN will collect     Status: None   Collection Time: 12/29/19  4:22 PM  Result Value Ref Range   Fecal Occult Bld NEGATIVE NEGATIVE    Comment: Performed at [facility], [place] [address], [place], [place] [address]  CBC     Status: Abnormal   Collection Time: 12/29/19  7:59 PM  Result Value Ref Range   WBC 6.8 4.0 - 10.5 K/uL   RBC 3.00 (L) 4.22 - 5.81 MIL/uL   Hemoglobin 9.7 (L) 13.0 - 17.0 g/dL    Comment: REPEATED TO VERIFY POST TRANSFUSION SPECIMEN    HCT 28.0 (L) 39.0 - 52.0 %   MCV 93.3 80.0 - 100.0 fL   MCH 32.3 26.0 - 34.0 pg   MCHC 34.6 30.0 - 36.0 g/dL   RDW 18.3 (H) 11.5 - 15.5 %   Platelets 500 (H) 150 - 400 K/uL   nRBC 0.0 0.0 - 0.2 %    Comment: Performed at [facility], [place] [address] [place], [place] [address]  Protime-INR     Status: None   Collection Time: 12/30/19  3:19 AM  Result Value Ref Range   Prothrombin Time 12.5 11.4 - 15.2 seconds   INR 0.9 0.8 - 1.2    Comment: (NOTE) INR goal varies based on device and disease states. Performed at [facility], [place] [address] [place], [place] [id number]   Basic metabolic panel     Status: Abnormal   Collection Time: 12/30/19  3:19 AM  Result Value Ref Range   Sodium 136 135 - 145 mmol/L   Potassium 4.1 3.5 - 5.1 mmol/L   Chloride 103 98 - 111 mmol/L   CO2 25 22 - 32 mmol/L   Glucose, Bld 100 (  H) 70 - 99 mg/dL   BUN 10 6 - 20 mg/dL   Creatinine, Ser 0.68 0.61 - 1.24 mg/dL   Calcium 8.3 (L) 8.9 - 10.3 mg/dL   GFR calc non Af Amer >60 >60 mL/min   GFR calc Af Amer >60 >60 mL/min   Anion gap 8 5 - 15    Comment: Performed at [place] [address], [place], [place] [address]  CBC     Status: Abnormal   Collection Time: 12/30/19  3:19 AM  Result Value Ref Range   WBC 6.6 4.0 - 10.5 K/uL    RBC 2.95 (L) 4.22 - 5.81 MIL/uL   Hemoglobin 9.4 (L) 13.0 - 17.0 g/dL   HCT 27.7 (L) 39.0 - 52.0 %   MCV 93.9 80.0 - 100.0 fL   MCH 31.9 26.0 - 34.0 pg   MCHC 33.9 30.0 - 36.0 g/dL   RDW 18.2 (H) 11.5 - 15.5 %   Platelets 531 (H) 150 - 400 K/uL   nRBC 0.0 0.0 - 0.2 %    Comment: Performed at [place] [address] [place], [place] [address]   VAS [place] LOWER EXTREMITY SAPHENOUS VEIN MAPPING  Result Date: 12/29/2019 LOWER EXTREMITY VEIN MAPPING Indications:  Pre-op Risk Factors: PAD.  Comparison Study: No prior study Performing Technologist: Sharion Dove RVS  Examination Guidelines: A complete evaluation includes B-mode imaging, spectral Doppler, color Doppler, and power Doppler as needed of all accessible portions of each vessel. Bilateral testing is considered an integral part of a complete examination. Limited examinations for reoccurring indications may be performed as noted. +---------------+-----------+----------------------+---------------+-----------+    RT Diameter   RT Findings          GSV             LT Diameter   LT Findings        (cm)                                               (cm)                    +---------------+-----------+----------------------+---------------+-----------+       0.49                       Saphenofemoral                                                                      Junction                                     +---------------+-----------+----------------------+---------------+-----------+       0.38                       Proximal thigh                                  +---------------+-----------+----------------------+---------------+-----------+  0.38        branching        Mid thigh                                     +---------------+-----------+----------------------+---------------+-----------+       0.38                        Distal thigh                                    +---------------+-----------+----------------------+---------------+-----------+       0.22                            Knee                                       +---------------+-----------+----------------------+---------------+-----------+       0.26        branching        Prox calf                                     +---------------+-----------+----------------------+---------------+-----------+       0.24        branching         Mid calf                                     +---------------+-----------+----------------------+---------------+-----------+       0.24                        Distal calf                                    +---------------+-----------+----------------------+---------------+-----------+       0.21                           Ankle                                       +---------------+-----------+----------------------+---------------+-----------+ Diagnosing physician: Ruta Hinds MD Electronically signed by Ruta Hinds MD on 12/29/2019 at 1:46:13 PM.    Final        Medical Problem List and Plan: 1.  Decreased functional ability secondary to peripheral vascular disease status post left AKA 12/24/2019 as well as right femoral-popliteal bypass 12/30/2019  -patient may shower if L AKA is covered   -ELOS/Goals: Mod I to min assist- 10-14 days 2.  Antithrombotics: -DVT/anticoagulation: Subcutaneous heparin  -antiplatelet therapy: Aspirin 81 mg daily 3. Pain Management: Neurontin 100 mg 3 times daily, oxycodone as needed 4. Mood: Provide emotional support  -antipsychotic agents: N/A 5. Neuropsych: This patient is capable of making decisions on his own behalf. 6. Skin/Wound Care- wounds on RLE, groin from R fempop bypass and Stage II on L buttocks: Routine skin  checks/sacral foam prophylactic dressing change every third day- needs dressing changes for RLE as well. 7. Fluids/Electrolytes/Nutrition: Routine in and outs with follow-up chemistries 8.  Acute blood loss  anemia.  Follow-up CBC -last CBC showed Hb of 8.4- down from 11 after surgery- will monitor 9.  Hypertension.  Monitor with increased mobility 10.  Hyperlipidemia.  Lipitor 11.  History of tobacco alcohol use- at least 1/2ppd- .  Provide counseling. 12.  Vitamin B12 deficiency.  Continue supplement 13.  Constipation.  Laxative assistance 14. Alcohol abuse- pt finally admitted since retirement was drinking "1/2 pint every day of vodka"- monitor for withdrawal- likely drinking more. 15. Severe protein malnutrition- BMI is 20- but albumin is 1.6- which is significant and total protein is 5.7- will con't protein supplements, and if loses more weight, will consult nutrition.  16. L shoulder weakness/discomfort- will d/w OT in how it relates to function.  17. Ostemyelitis? - Pt is on Vanc- doesn't need Vanc anymore per vascualr physicians since had L AKA.   Lavon Paganini Angiulli, PA-C 12/30/2019   I have personally performed a face to face diagnostic evaluation of this patient and formulated the key components of the plan.  Additionally, I have personally reviewed laboratory data, imaging studies, as well as relevant notes and concur with the physician assistant's documentation above.   The patient's status has not changed from the original H&P.  Any changes in documentation from the acute care chart have been noted above.

## 2019-12-30 NOTE — Progress Notes (Signed)
Inpatient Rehabilitation Admissions Coordinator  Noted pt with surgery today. I have spoken with patient and family yesterday and they are in agreement to CIR admit prior to d/c home. We will follow up tomorrow with his progress to assist in determining when pt ready to admit.  Danne Baxter, RN, MSN Rehab Admissions Coordinator [phone] 12/30/2019 2:36 PM

## 2019-12-30 NOTE — Anesthesia Postprocedure Evaluation (Signed)
Anesthesia Post Note  Patient: Terrence Carr  Procedure(s) Performed: RIGHT FEMORAL-POPLITEAL ARTERY BYPASS GRAFT (Right Leg Upper)     Patient location during evaluation: PACU Anesthesia Type: General Level of consciousness: awake and alert and oriented Pain management: pain level controlled Vital Signs Assessment: post-procedure vital signs reviewed and stable Respiratory status: spontaneous breathing, nonlabored ventilation and respiratory function stable Cardiovascular status: blood pressure returned to baseline Postop Assessment: no apparent nausea or vomiting Anesthetic complications: no    Last Vitals:  Vitals:   12/30/19 1300 12/30/19 1311  BP:  139/75  Pulse: 77   Resp:    Temp:    SpO2: 94%     Last Pain:  Vitals:   12/30/19 1241  TempSrc:   PainSc: 0-No pain                 Brennan Bailey

## 2019-12-30 NOTE — Op Note (Signed)
Patient name: Terrence Carr MRN: [id number] DOB: [birth year] Sex: male  12/30/2019 Pre-operative Diagnosis: critical right lower extremity ischemia Post-operative diagnosis:  Same Surgeon:  Erlene Quan C. Donzetta Matters, MD Assistant: Leontine Locket, PA Procedure Performed: 1.  Reexposure right common femoral artery greater than 30 days 2.  Right common femoral profundofemoral endarterectomy 3.  Harvest right greater saphenous vein 4.  Right common femoral to below-knee popliteal artery bypass with ipsilateral, translocated and reversed greater saphenous vein   Indications: 58 year old male presented with bilateral lower extremity wounds.  His left foot was nonsalvageable he underwent primary left above-knee amputation.  On the right he has a leg wound as well as medial foot wound.  ABIs are nondetectable.  Previously is undergone common femoral endarterectomy and common iliac artery stenting which are both patent by recent angiogram.  He is now indicated for right femoropopliteal bypass with vein versus graft.  Findings: The vein was marginal for use measuring approximately 3 mm throughout its course.  It was the same size throughout and so was reversed.  I elected to use this vein given the high risk of infection as well as high ongoing risk for amputation.  At completion he did have strong although monophasic signals at the anterior tibial, posterior tibial and peroneal arteries at the ankle that were absent with compression of the vein graft.   Procedure:  The patient was identified in the holding area and taken to the operating room where is placed upon operative when general anesthesia induced.  He was to the prepped and draped in the right lower extremity in the usual fashion antibiotics were minister and timeout was called.  We began by opening his previous groin incision.  There was a stellate scar in the groin.  We dissected through significant scar tissue tediously.  We are able to get onto the  inguinal ligament and get a vessel loop around the distal external leg artery and proximal common femoral artery.  We then dissected inferiorly down to the SFA identified this.  We dissected back and identify the profunda and placed a vessel loop around this.  Through the same incision we then identified our greater saphenous vein.  We did dissected this out in the groin it did have an early branch point.  We turned our attention to the below the knee area.  We identified the saphenous vein on the skin.  Incision was made over this.  Was diminutive we dissected out for the entirety of the incision.  Made a skip incision below this incision.  We transected the vein and flushed with heparinized and it did seem to dilate well.  We proceeded with dividing branches between clips and ties.  We made to skip incisions in the thigh.  Ultimately at the saphenofemoral junction we clamped it and divided.  We oversewed this with running 5-0 Prolene suture in a mattress fashion.  The vein was passed to the back table for preparation.  I began with dissecting out the below-knee popliteal artery by dividing the fascia.  The artery was soft felt amenable for bypass.  A tunnel and was placed in a subfascial plane.  Unfortunately was a femoral vein injury near the groin incision.  We were able to find the femoral vein and tied this off from the groin incision and hemostasis was obtained.  We then completed preparing our vein on the back table.  Patient was fully heparinized at this time.  We then clamped the profunda femoris followed by  common femoral arteries.  I opened the artery vertically right at the profunda takeoff.  We did have backbleeding from the SFA this was subsequently clamped.  I had to perform endarterectomy what appeared to be intimal hyperplasia into the profunda femoris and the common femoral artery.  After I establish good backbleeding from the profunda and strong antegrade bleeding in the common femoral I then  brought the vein to the table.  It appeared to be the same size throughout.  We reversed and spatulated.  It was sewn end-to-side with 5-0 Prolene suture.  Upon completion we then flushed through the vein.  We had good pulsatility all the way through the vein.  It was clamped distally and marked for orientation.  Was tunneled subfascially.  Below the knee we clamped the popliteal artery proximally distally and opened longitudinally where it appeared healthy.  The leg was straightened and the vein graft was trimmed to size and spatulated.  This was sewn end-to-side with 6-0 Prolene suture.  Prior to completion estimates we will flushing all directions.  Upon completion there was very good signal in the vein graft in the popliteal and the wound.  We had good strong but monophasic signals at the 3 tibial vessels at the ankle.  With that we elected to reverse heparinization.  I should mention that there were no signals at the ankle with compression of the vein graft.  We irrigated our wounds and obtain hemostasis closed the mall in layers with Vicryl and Monocryl.  Dermabond is placed to level the skin.  He was awakened anesthesia having tolerated procedure well that immediate complication.  All counts were correct at completion.  EBL: 250 cc   Nishika Parkhurst C. Donzetta Matters, MD [facility] Office: [phone] Pager: [phone]

## 2019-12-30 NOTE — Progress Notes (Signed)
PT Cancellation Note  Patient Details Name: ZORAIZ NEGLEY MRN: [id number] DOB: [birth year]   Cancelled Treatment:    Reason Eval/Treat Not Completed: Patient at procedure or test/unavailable. Pt in OR for RLE fem-pop bypass. PT to follow up tomorrow.   Lorriane Shire 12/30/2019, 9:05 AM  Lorrin Goodell, PT  Office # [phone] Pager [phone]

## 2019-12-31 ENCOUNTER — Inpatient Hospital Stay (HOSPITAL_COMMUNITY): Payer: Medicaid Other

## 2019-12-31 ENCOUNTER — Inpatient Hospital Stay (HOSPITAL_COMMUNITY)
Admit: 2019-12-31 | Discharge: 2020-01-08 | DRG: 559 | Disposition: A | Discharge status: Home Health | Payer: Medicaid Other | Source: Intra-hospital | Attending: Physical Medicine and Rehabilitation | Admitting: Physical Medicine and Rehabilitation

## 2019-12-31 DIAGNOSIS — L89322 Pressure ulcer of left buttock, stage 2: Secondary | ICD-10-CM | POA: Diagnosis present

## 2019-12-31 DIAGNOSIS — I10 Essential (primary) hypertension: Secondary | ICD-10-CM | POA: Diagnosis present

## 2019-12-31 DIAGNOSIS — Z89612 Acquired absence of left leg above knee: Secondary | ICD-10-CM | POA: Diagnosis not present

## 2019-12-31 DIAGNOSIS — Z716 Tobacco abuse counseling: Secondary | ICD-10-CM | POA: Diagnosis not present

## 2019-12-31 DIAGNOSIS — I739 Peripheral vascular disease, unspecified: Secondary | ICD-10-CM | POA: Diagnosis present

## 2019-12-31 DIAGNOSIS — M869 Osteomyelitis, unspecified: Secondary | ICD-10-CM

## 2019-12-31 DIAGNOSIS — E43 Unspecified severe protein-calorie malnutrition: Secondary | ICD-10-CM | POA: Diagnosis present

## 2019-12-31 DIAGNOSIS — R64 Cachexia: Secondary | ICD-10-CM | POA: Diagnosis present

## 2019-12-31 DIAGNOSIS — L89159 Pressure ulcer of sacral region, unspecified stage: Secondary | ICD-10-CM

## 2019-12-31 DIAGNOSIS — Z7902 Long term (current) use of antithrombotics/antiplatelets: Secondary | ICD-10-CM

## 2019-12-31 DIAGNOSIS — Z7982 Long term (current) use of aspirin: Secondary | ICD-10-CM | POA: Diagnosis not present

## 2019-12-31 DIAGNOSIS — E785 Hyperlipidemia, unspecified: Secondary | ICD-10-CM | POA: Diagnosis present

## 2019-12-31 DIAGNOSIS — Z79891 Long term (current) use of opiate analgesic: Secondary | ICD-10-CM

## 2019-12-31 DIAGNOSIS — K5900 Constipation, unspecified: Secondary | ICD-10-CM | POA: Diagnosis present

## 2019-12-31 DIAGNOSIS — Z886 Allergy status to analgesic agent status: Secondary | ICD-10-CM | POA: Diagnosis not present

## 2019-12-31 DIAGNOSIS — Z6820 Body mass index (BMI) 20.0-20.9, adult: Secondary | ICD-10-CM

## 2019-12-31 DIAGNOSIS — Z888 Allergy status to other drugs, medicaments and biological substances status: Secondary | ICD-10-CM

## 2019-12-31 DIAGNOSIS — Z79899 Other long term (current) drug therapy: Secondary | ICD-10-CM

## 2019-12-31 DIAGNOSIS — F1010 Alcohol abuse, uncomplicated: Secondary | ICD-10-CM

## 2019-12-31 DIAGNOSIS — Z4781 Encounter for orthopedic aftercare following surgical amputation: Secondary | ICD-10-CM | POA: Diagnosis present

## 2019-12-31 DIAGNOSIS — E538 Deficiency of other specified B group vitamins: Secondary | ICD-10-CM | POA: Diagnosis present

## 2019-12-31 DIAGNOSIS — D62 Acute posthemorrhagic anemia: Secondary | ICD-10-CM | POA: Diagnosis present

## 2019-12-31 DIAGNOSIS — F17210 Nicotine dependence, cigarettes, uncomplicated: Secondary | ICD-10-CM | POA: Diagnosis present

## 2019-12-31 LAB — CBC WITH DIFFERENTIAL/PLATELET
Abs Immature Granulocytes: 0.07 10*3/uL (ref 0.00–0.07)
Basophils Absolute: 0 10*3/uL (ref 0.0–0.1)
Basophils Relative: 0 %
Eosinophils Absolute: 0 10*3/uL (ref 0.0–0.5)
Eosinophils Relative: 0 %
HCT: 24.3 % — ABNORMAL LOW (ref 39.0–52.0)
Hemoglobin: 8.4 g/dL — ABNORMAL LOW (ref 13.0–17.0)
Immature Granulocytes: 1 %
Lymphocytes Relative: 15 %
Lymphs Abs: 1.4 10*3/uL (ref 0.7–4.0)
MCH: 32.4 pg (ref 26.0–34.0)
MCHC: 34.6 g/dL (ref 30.0–36.0)
MCV: 93.8 fL (ref 80.0–100.0)
Monocytes Absolute: 1.2 10*3/uL — ABNORMAL HIGH (ref 0.1–1.0)
Monocytes Relative: 14 %
Neutro Abs: 6.3 10*3/uL (ref 1.7–7.7)
Neutrophils Relative %: 70 %
Platelets: 629 10*3/uL — ABNORMAL HIGH (ref 150–400)
RBC: 2.59 MIL/uL — ABNORMAL LOW (ref 4.22–5.81)
RDW: 17.3 % — ABNORMAL HIGH (ref 11.5–15.5)
WBC: 9 10*3/uL (ref 4.0–10.5)
nRBC: 0 % (ref 0.0–0.2)

## 2019-12-31 LAB — COMPREHENSIVE METABOLIC PANEL
ALT: 22 U/L (ref 0–44)
AST: 30 U/L (ref 15–41)
Albumin: 1.6 g/dL — ABNORMAL LOW (ref 3.5–5.0)
Alkaline Phosphatase: 155 U/L — ABNORMAL HIGH (ref 38–126)
Anion gap: 8 (ref 5–15)
BUN: 10 mg/dL (ref 6–20)
CO2: 25 mmol/L (ref 22–32)
Calcium: 8.5 mg/dL — ABNORMAL LOW (ref 8.9–10.3)
Chloride: 102 mmol/L (ref 98–111)
Creatinine, Ser: 0.75 mg/dL (ref 0.61–1.24)
GFR calc Af Amer: >60 mL/min (ref >60)
GFR calc non Af Amer: >60 mL/min (ref >60)
Glucose, Bld: 112 mg/dL — ABNORMAL HIGH (ref 70–99)
Potassium: 4.2 mmol/L (ref 3.5–5.1)
Sodium: 135 mmol/L (ref 135–145)
Total Bilirubin: 0.3 mg/dL (ref 0.3–1.2)
Total Protein: 5.7 g/dL — ABNORMAL LOW (ref 6.5–8.1)

## 2019-12-31 LAB — MAGNESIUM: Magnesium: 2.2 mg/dL (ref 1.7–2.4)

## 2019-12-31 MED ORDER — THIAMINE HCL 100 MG PO TABS
100.0000 mg | ORAL_TABLET | Freq: Every day | ORAL | Status: DC
  Administered 2020-01-01 – 2020-01-08 (×8): 100 mg via ORAL
  Filled 2019-12-31 (×8): qty 1

## 2019-12-31 MED ORDER — PANTOPRAZOLE SODIUM 40 MG PO TBEC: 40.0000 mg | DELAYED_RELEASE_TABLET | Freq: Every day | ORAL | Status: DC

## 2019-12-31 MED ORDER — HEPARIN SODIUM (PORCINE) 5000 UNIT/ML IJ SOLN: 5000.0000 [IU] | Freq: Three times a day (TID) | INTRAMUSCULAR | Status: DC

## 2019-12-31 MED ORDER — DOCUSATE SODIUM 100 MG PO CAPS
100.0000 mg | ORAL_CAPSULE | Freq: Two times a day (BID) | ORAL | Status: DC
  Administered 2019-12-31 – 2020-01-02 (×4): 100 mg via ORAL
  Filled 2019-12-31 (×4): qty 1

## 2019-12-31 MED ORDER — ATORVASTATIN CALCIUM 20 MG PO TABS: 20.0000 mg | ORAL_TABLET | Freq: Every day | ORAL | Status: DC

## 2019-12-31 MED ORDER — GABAPENTIN 300 MG PO CAPS: 300.0000 mg | ORAL_CAPSULE | Freq: Three times a day (TID) | ORAL | Status: DC

## 2019-12-31 MED ORDER — POLYETHYLENE GLYCOL 3350 17 G PO PACK
17.0000 g | PACK | Freq: Every day | ORAL | Status: DC
  Filled 2019-12-31: qty 1

## 2019-12-31 MED ORDER — ADULT MULTIVITAMIN W/MINERALS CH
1.0000 | ORAL_TABLET | Freq: Every day | ORAL | Status: DC
  Administered 2020-01-01 – 2020-01-08 (×8): 1 via ORAL
  Filled 2019-12-31 (×8): qty 1

## 2019-12-31 MED ORDER — BISACODYL 5 MG PO TBEC: 5.0000 mg | DELAYED_RELEASE_TABLET | Freq: Every day | ORAL | Status: DC | PRN

## 2019-12-31 MED ORDER — ACETAMINOPHEN 650 MG RE SUPP: 325.0000 mg | RECTAL | Status: DC | PRN

## 2019-12-31 MED ORDER — ATORVASTATIN CALCIUM 10 MG PO TABS
20.0000 mg | ORAL_TABLET | Freq: Every day | ORAL | Status: DC
  Administered 2020-01-01 – 2020-01-07 (×7): 20 mg via ORAL
  Filled 2019-12-31 (×7): qty 2

## 2019-12-31 MED ORDER — ENSURE ENLIVE PO LIQD
237.0000 mL | Freq: Two times a day (BID) | ORAL | Status: DC
  Administered 2020-01-02 – 2020-01-05 (×5): 237 mL via ORAL

## 2019-12-31 MED ORDER — OXYCODONE HCL 5 MG PO TABS
5.0000 mg | ORAL_TABLET | ORAL | Status: DC | PRN
  Administered 2019-12-31 – 2020-01-03 (×6): 10 mg via ORAL
  Administered 2020-01-04 – 2020-01-05 (×4): 5 mg via ORAL
  Administered 2020-01-05: 10 mg via ORAL
  Administered 2020-01-05: 5 mg via ORAL
  Administered 2020-01-06: 10 mg via ORAL
  Administered 2020-01-06: 5 mg via ORAL
  Administered 2020-01-07 (×3): 10 mg via ORAL
  Filled 2019-12-31 (×3): qty 1
  Filled 2019-12-31 (×7): qty 2
  Filled 2019-12-31: qty 1
  Filled 2019-12-31 (×2): qty 2
  Filled 2019-12-31: qty 1
  Filled 2019-12-31 (×5): qty 2
  Filled 2019-12-31: qty 1

## 2019-12-31 MED ORDER — CYANOCOBALAMIN 1000 MCG PO TABS: 1000.0000 ug | ORAL_TABLET | Freq: Every day | ORAL | Status: DC

## 2019-12-31 MED ORDER — HEPARIN SODIUM (PORCINE) 5000 UNIT/ML IJ SOLN
5000.0000 [IU] | Freq: Three times a day (TID) | INTRAMUSCULAR | Status: DC
  Administered 2019-12-31 – 2020-01-08 (×23): 5000 [IU] via SUBCUTANEOUS
  Filled 2019-12-31 (×25): qty 1

## 2019-12-31 MED ORDER — JUVEN PO PACK
1.0000 | PACK | Freq: Two times a day (BID) | ORAL | Status: DC
  Administered 2020-01-01 – 2020-01-06 (×3): 1 via ORAL
  Filled 2019-12-31 (×8): qty 1

## 2019-12-31 MED ORDER — ACETAMINOPHEN 325 MG PO TABS
325.0000 mg | ORAL_TABLET | ORAL | Status: DC | PRN
  Administered 2020-01-01 – 2020-01-08 (×12): 650 mg via ORAL
  Filled 2019-12-31 (×12): qty 2

## 2019-12-31 MED ORDER — ACETAMINOPHEN 325 MG PO TABS: 325.0000 mg | ORAL_TABLET | ORAL | Status: AC | PRN

## 2019-12-31 MED ORDER — OXYCODONE HCL 5 MG PO TABS: 5.0000 mg | ORAL_TABLET | ORAL | 0 refills | Status: DC | PRN

## 2019-12-31 MED ORDER — POLYETHYLENE GLYCOL 3350 17 G PO PACK: 17.0000 g | PACK | Freq: Every day | ORAL | 0 refills | Status: AC | PRN

## 2019-12-31 MED ORDER — VANCOMYCIN HCL 750 MG/150ML IV SOLN: 750.0000 mg | Freq: Two times a day (BID) | INTRAVENOUS | Status: DC

## 2019-12-31 MED ORDER — GABAPENTIN 100 MG PO CAPS
100.0000 mg | ORAL_CAPSULE | Freq: Three times a day (TID) | ORAL | Status: DC
  Administered 2019-12-31 – 2020-01-08 (×23): 100 mg via ORAL
  Filled 2019-12-31 (×23): qty 1

## 2019-12-31 MED ORDER — DOCUSATE SODIUM 100 MG PO CAPS: 100.0000 mg | ORAL_CAPSULE | Freq: Two times a day (BID) | ORAL | 0 refills | Status: AC

## 2019-12-31 MED ORDER — ENSURE ENLIVE PO LIQD: 237.0000 mL | Freq: Two times a day (BID) | ORAL | 12 refills | Status: DC

## 2019-12-31 MED ORDER — PANTOPRAZOLE SODIUM 40 MG PO TBEC
40.0000 mg | DELAYED_RELEASE_TABLET | Freq: Every day | ORAL | Status: DC
  Administered 2020-01-01 – 2020-01-08 (×8): 40 mg via ORAL
  Filled 2019-12-31 (×8): qty 1

## 2019-12-31 MED ORDER — ASPIRIN EC 81 MG PO TBEC
81.0000 mg | DELAYED_RELEASE_TABLET | Freq: Every day | ORAL | Status: DC
  Administered 2020-01-01 – 2020-01-08 (×8): 81 mg via ORAL
  Filled 2019-12-31 (×8): qty 1

## 2019-12-31 MED ORDER — VITAMIN B-12 1000 MCG PO TABS
1000.0000 ug | ORAL_TABLET | Freq: Every day | ORAL | Status: DC
  Administered 2020-01-01 – 2020-01-08 (×8): 1000 ug via ORAL
  Filled 2019-12-31 (×8): qty 1

## 2019-12-31 MED ORDER — ASPIRIN 81 MG PO TBEC: 81.0000 mg | DELAYED_RELEASE_TABLET | Freq: Every day | ORAL | Status: AC

## 2019-12-31 MED ORDER — FOLIC ACID 1 MG PO TABS
1.0000 mg | ORAL_TABLET | Freq: Every day | ORAL | Status: DC
  Administered 2020-01-01 – 2020-01-08 (×8): 1 mg via ORAL
  Filled 2019-12-31 (×8): qty 1

## 2019-12-31 MED ORDER — SORBITOL 70 % SOLN
30.0000 mL | Freq: Every day | Status: DC | PRN
  Administered 2020-01-07: 30 mL via ORAL
  Filled 2019-12-31: qty 30

## 2019-12-31 MED ORDER — THIAMINE HCL 100 MG PO TABS: 100.0000 mg | ORAL_TABLET | Freq: Every day | ORAL | Status: DC

## 2019-12-31 MED ORDER — GUAIFENESIN-DM 100-10 MG/5ML PO SYRP: 15.0000 mL | ORAL_SOLUTION | ORAL | 0 refills | Status: DC | PRN

## 2019-12-31 NOTE — TOC Transition Note (Signed)
Transition of Care [facility]) - CM/SW Discharge Note Marvetta Gibbons RN, BSN Transitions of Care Unit 4E- RN Case Manager [phone]   Patient Details  Name: Terrence Carr MRN: [id number] Date of Birth: [birth year]  Transition of Care [facility]) CM/SW Contact:  Dawayne Patricia, RN Phone Number: 12/31/2019, 1:53 PM   Clinical Narrative:    Pt s/p left AKA, CIR consulted - per Claiborne Billings with [facility]- pt has bed available today and they can accept pt later this afternoon- pt is stable for transition to [facility]- pt agreeable to transition to [facility].    Final next level of care: IP Rehab Facility Barriers to Discharge: No Barriers Identified   Patient Goals and CMS Choice Patient states their goals for this hospitalization and ongoing recovery are:: rehab      Discharge Placement               [facility]        Discharge Plan and Services                     Rehab                Social Determinants of Health (SDOH) Interventions     Readmission Risk Interventions No flowsheet data found.

## 2019-12-31 NOTE — Discharge Summary (Signed)
Physician Discharge Summary  Terrence Carr [id number] DOB: 06-Nov-1961 DOA: 12/23/2019  PCP: Patient, No Pcp Per  Admit date: 12/23/2019 Discharge date: 12/31/2019  Admitted From: Home  Discharge disposition: CIR   Recommendations for Outpatient Follow-Up:   Follow-up with vascular surgery after discharge.  Discharge Diagnosis:   Active Problems:   Osteomyelitis of ankle or foot, acute, right (HCC)   Lactic acidosis   Alcohol abuse   Alcohol withdrawal (HCC)   Macrocytic anemia   Sacral decubitus ulcer   Hyperglycemia   Essential hypertension   Peripheral arterial disease (HCC)   Vitamin B12 deficiency   Thrombocytosis (HCC)   Hypoglycemia   Discharge Condition: Improved.  Diet recommendation: Low sodium, heart healthy.   Wound care:  continue wound care  Code status: Full.   History of Present Illness:   Patient is a 58 year old African-American male with past medical history significant for hypertension, peripheral vascular disease, tobacco abuse, alcohol abuse, peripheral neuropathy, B12 deficiencyandsevere protein calorie malnutrition presented to the hospital with bilateral leg wound and pain progressively worsening over 3 weeks. He was found to have extensive gangrene of left foot. 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  He also had wounds over the right lower extremity. On 12/29,Patient noted to be anemic and received PRBC transfusion.   Patient underwent right lower extremity angiogram and there was subsequently plan for right femoropopliteal bypass   Hospital Course:  Following conditions were addressed during hospitalization as listed below, follow-up plan discontinue italics.  Peripheral vascular disease, critical limb ischemia bilaterally, Left foot gangrene. Osteomyelitis. 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 Curt Jews.  Tolerated well.  Patient had significant lactic acidosis at the time of admission secondary to  hypoperfusion.  Patient had arteriogram to the right lower extremity on 12/29 and underwent femoropopliteal bypass 12/30/2019.  Patient will need to follow-up with vascular surgery after discharge.  Please discuss about the need for further antibiotics with vascular surgery.  Right lower extremity wounds.  Pretibial and foot lesion.  femoral-popliteal bypass 12/30/2019.  Wound care for dressing. Received vancomycin during hospital stay, Could consider discontinuation if ok with vascular surgery.   Alcohol abuse/ alcohol withdrawal.  Received CIWA protocol.  Continue thiamine.  No signs of withdrawal at this time.  Acute on chronic macrocytic anemia.  Severe vitamin B12 deficiency. Received B12 injection subcutaneously followed by oral.  Status post 2 units of packed RBC transfusion for hemoglobin of 6.7.  Hemoglobin today is 8.4. Please monitor CBC closely and transfuse as necessary.  Thrombocytosis.  Reactive.  Monitor CBC closely  Severe protein calorie malnutrition.  Present on admission.  Likely secondary to alcohol abuse.  Continue nutritional support including supplements  Left buttocks pressure ulceration stage II present on admission.  Continue preventive protocol and dressing.  Disposition.  At this time, patient is stable for disposition to CIR.  Medical Consultants:    Vascular surgery  Procedures:      Left AKA on 12/24/2019 by vascular surgery Dr. Curt Jews.  Right lower extremity arteriogram 12/28  Packed RBC transfusion.  Fem Pop bypass 12/30/19  Subjective:   Today, patient denies interval complaints.  Denies nausea, vomiting or overt pain.  Discharge Exam:   Vitals:   12/31/19 0437 12/31/19 0759  BP: (!) 118/48 (!) 113/47  Pulse: 70 78  Resp: 17 18  Temp: 98.4 F (36.9 C) 98.7 F (37.1 C)  SpO2: 96% 98%   Vitals:   12/30/19 1910 12/30/19 2328 12/31/19 0437 12/31/19  0759  BP: 93/80 131/74 (!) 118/48 (!) 113/47  Pulse: 73 73 70 78  Resp: 14  15 17 18   Temp: (!) 97.3 F (36.3 C) 98.5 F (36.9 C) 98.4 F (36.9 C) 98.7 F (37.1 C)  TempSrc: Oral Oral Oral Oral  SpO2: 100% 99% 96% 98%  Weight:   57.3 kg   Height:       Body mass index is 20.4 kg/m.   General: Alert awake, not in obvious distress.  Thinly built. HENT: pupils equally reacting to light and accommodation.  No scleral pallor or icterus noted. Oral mucosa is moist.  Chest:  Clear breath sounds.  Diminished breath sounds bilaterally. No crackles or wheezes.  CVS: S1 &S2 heard. No murmur.  Regular rate and rhythm. Abdomen: Soft, nontender, nondistended.  Bowel sounds are heard.   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  Right leg ulcers with dressing status post femoropopliteal bypass.  Heel pad in place.  Peripheral pulses palpable, no gross groin hematoma.Marland Kitchen Psych: Alert, awake and oriented, normal mood CNS:  No cranial nerve deficits.  Power equal in all extremities.   Skin: Right lower extremity ulcers with dressing.  Left above-knee amputation.  The results of significant diagnostics from this hospitalization (including imaging, microbiology, ancillary and laboratory) are listed below for reference.     Diagnostic Studies:   DG Tibia/Fibula Left  Result Date: 12/23/2019 CLINICAL DATA:  Multiple wounds. Assessment for osteomyelitis. EXAM: LEFT TIBIA AND FIBULA - 2 VIEW COMPARISON:  None FINDINGS: Diffuse mild soft tissue swelling of the left lower leg. Question some subcortical lucency along the distal fibular metadiaphysis with overlying material. If there is visible ulceration at this location, could reflect early features of osteomyelitis. No other erosion, destructive change, or immature periostitis to suggest a discrete focus of infection. Few fragments in the suprapatellar joint recess of the left knee, may be posttraumatic or degenerative in nature. Serpiginous sclerosis in the distal femur likely sequela of prior infarct.  Atherosclerotic calcifications noted in the posterior knee. IMPRESSION: 1. Question subtle subcortical lucency along the distal fibular metadiaphysis with overlying bandage material. If there is visible ulceration at this location, could reflect early features of osteomyelitis. 2. No other erosion, destructive change, or immature periostitis to suggest a discrete focus of infection. Electronically Signed   By: Lovena Le M.D.   On: 12/23/2019 18:59   DG Tibia/Fibula Right  Result Date: 12/23/2019 CLINICAL DATA:  Wounds, assess for ostia EXAM: RIGHT TIBIA AND FIBULA - 2 VIEW COMPARISON:  Concurrent ankle radiographs FINDINGS: Diffuse edematous thickening of the right lower extremity which is increasing towards the level of the foot. There is circumferential swelling at the level of the ankle with a large effusion and some questionable gas suspicious lucency noted in the subcortical bone of the distal tibial metaphysis. Mild tricompartmental degenerative changes are seen at the knee. Vascular calcium in the soft tissues. No acute fracture or traumatic malalignment. IMPRESSION: 1. Diffuse edematous thickening of the right lower extremity which is increasing towards the level of the foot. 2. There is a large ankle effusion, questionable gas in the joint, and circumferential swelling at the level of the ankle. 3. Subcortical lucency at the distal tibial metaphysis, worrisome for osteomyelitis. Electronically Signed   By: Lovena Le M.D.   On: 12/23/2019 19:09   DG Foot Complete Left  Result Date: 12/23/2019 CLINICAL DATA:  Wounds, assess for osteomyelitis EXAM: LEFT FOOT - COMPLETE 3+ VIEW COMPARISON:  None. FINDINGS: Diffuse  soft tissue swelling of the foot. There is ulceration noted along the plantar aspect of the heel and posterolateral aspect of the foot. No subjacent osseous erosion, cortical lucency or immature periostitis to suggest site of osteomyelitis. No other radiographically evident site of  ulceration. Vascular calcium noted in the soft tissues. Diffuse degenerative changes in the midfoot are noted most pronounced involving navicular, medial cuneiform and base of the first metatarsal. Clawtoe deformities of the second through fifth digits. IMPRESSION: 1. Soft tissue ulceration along the plantar aspect of the heel and posterolateral aspect of the foot. 2. No convincing radiographic evidence for osteomyelitis. If there is persisting concern contrast-enhanced MRI is more sensitive and specific. 3. Diffuse soft tissue swelling of the foot. 4. Diffuse degenerative changes in the midfoot. Electronically Signed   By: Lovena Le M.D.   On: 12/23/2019 19:02   DG Foot Complete Right  Result Date: 12/23/2019 CLINICAL DATA:  Wounds, assess for osteomyelitis. EXAM: RIGHT FOOT COMPLETE - 3+ VIEW COMPARISON:  None. FINDINGS: There is diffuse soft tissue swelling and ulceration of the foot at the level of the plantar calcaneus and along the medial aspect of the head of the first metatarsal. There is some transcortical lucency at the base of the first distal phalanx. The osseous structures appear diffusely demineralized which may limit detection of small or nondisplaced fractures or subtle osseous features of osteomyelitis. There is questionable intra-articular gas at the level of the ankle. There is a severe hallux valgus deformity of the first ray with marked clawtoe deformities of the second through fifth digits. Extensive degenerative change noted through the mid and hindfoot as well as at the level of the ankle. No acute fracture or malalignment is seen. Ankle effusion is noted. IMPRESSION: 1. Diffuse soft tissue swelling and focal ulceration of the foot at the level of the plantar calcaneus and along the medial aspect of the head of the first metatarsal. 2. There is some transcortical lucency at the base of the first distal phalanx which could be secondary to osteomyelitis. 3. Question some intra-articular  gas at the level of the ankle. Consider cross-sectional imaging. 4. Diffuse bony demineralization and degenerative changes, as above. Electronically Signed   By: Lovena Le M.D.   On: 12/23/2019 19:07     Labs:   Basic Metabolic Panel: Recent Labs  Lab 12/25/19 0359 12/27/19 0535 12/28/19 0447 12/29/19 0248 12/30/19 0319 12/30/19 1417 12/31/19 0311  NA 134* 134* 132* 135 136  --  135  K 3.9 3.9 4.2 3.9 4.1  --  4.2  CL 101 99 98 101 103  --  102  CO2 24 26 27 25 25   --  25  GLUCOSE 100* 102* 103* 111* 100*  --  112*  BUN 6 5* 6 8 10   --  10  CREATININE 0.70 0.76 0.63 0.78 0.68 0.68 0.75  CALCIUM 7.8* 8.1* 8.1* 7.9* 8.3*  --  8.5*  MG 1.9  --   --   --   --   --  2.2   GFR Estimated Creatinine Clearance: 79.6 mL/min (by C-G formula based on SCr of 0.75 mg/dL). Liver Function Tests: Recent Labs  Lab 12/31/19 0311  AST 30  ALT 22  ALKPHOS 155*  BILITOT 0.3  PROT 5.7*  ALBUMIN 1.6*   No results for input(s): LIPASE, AMYLASE in the last 168 hours. No results for input(s): AMMONIA in the last 168 hours. Coagulation profile Recent Labs  Lab 12/28/19 0447 12/30/19 0319  INR  1.0 0.9    CBC: Recent Labs  Lab 12/29/19 0248 12/29/19 1959 12/30/19 0319 12/30/19 1417 12/31/19 0311  WBC 5.5 6.8 6.6 11.0* 9.0  NEUTROABS  --   --   --   --  6.3  HGB 6.7* 9.7* 9.4* 11.0* 8.4*  HCT 20.1* 28.0* 27.7* 33.5* 24.3*  MCV 98.0 93.3 93.9 97.1 93.8  PLT 421* 500* 531* 701* 629*   Cardiac Enzymes: No results for input(s): CKTOTAL, CKMB, CKMBINDEX, TROPONINI in the last 168 hours. BNP: Invalid input(s): POCBNP CBG: No results for input(s): GLUCAP in the last 168 hours. D-Dimer No results for input(s): DDIMER in the last 72 hours. Hgb A1c No results for input(s): HGBA1C in the last 72 hours. Lipid Profile No results for input(s): CHOL, HDL, LDLCALC, TRIG, CHOLHDL, LDLDIRECT in the last 72 hours. Thyroid function studies No results for input(s): TSH, T4TOTAL, T3FREE,  THYROIDAB in the last 72 hours.  Invalid input(s): FREET3 Anemia work up No results for input(s): VITAMINB12, FOLATE, FERRITIN, TIBC, IRON, RETICCTPCT in the last 72 hours. Microbiology Recent Results (from the past 240 hour(s))  Respiratory Panel by RT PCR (Flu A&B, Covid) - Nasopharyngeal Swab     Status: None   Collection Time: 12/23/19  8:55 PM   Specimen: Nasopharyngeal Swab  Result Value Ref Range Status   SARS Coronavirus 2 by RT PCR NEGATIVE NEGATIVE Final    Comment: (NOTE) SARS-CoV-2 target nucleic acids are NOT DETECTED. The SARS-CoV-2 RNA is generally detectable in upper respiratoy specimens during the acute phase of infection. The lowest concentration of SARS-CoV-2 viral copies this assay can detect is 131 copies/mL. A negative result does not preclude SARS-Cov-2 infection and should not be used as the sole basis for treatment or other patient management decisions. A negative result may occur with  improper specimen collection/handling, submission of specimen other than nasopharyngeal swab, presence of viral mutation(s) within the areas targeted by this assay, and inadequate number of viral copies (<131 copies/mL). A negative result must be combined with clinical observations, patient history, and epidemiological information. The expected result is Negative. Fact Sheet for Patients:  PinkCheek.be Fact Sheet for Healthcare Providers:  GravelBags.it This test is not yet ap proved or cleared by the [place] FDA and  has been authorized for detection and/or diagnosis of SARS-CoV-2 by FDA under an Emergency Use Authorization (EUA). This EUA will remain  in effect (meaning this test can be used) for the duration of the COVID-19 declaration under Section 564(b)(1) of the Act, 21 U.S.C. section 360bbb-3(b)(1), unless the authorization is terminated or revoked sooner.    Influenza A by PCR NEGATIVE NEGATIVE Final     Influenza B by PCR NEGATIVE NEGATIVE Final    Comment: (NOTE) The Xpert Xpress SARS-CoV-2/FLU/RSV assay is intended as an aid in  the diagnosis of influenza from Nasopharyngeal swab specimens and  should not be used as a sole basis for treatment. Nasal washings and  aspirates are unacceptable for Xpert Xpress SARS-CoV-2/FLU/RSV  testing. Fact Sheet for Patients: PinkCheek.be Fact Sheet for Healthcare Providers: GravelBags.it This test is not yet approved or cleared by the [place] FDA and  has been authorized for detection and/or diagnosis of SARS-CoV-2 by  FDA under an Emergency Use Authorization (EUA). This EUA will remain  in effect (meaning this test can be used) for the duration of the  Covid-19 declaration under Section 564(b)(1) of the Act, 21  U.S.C. section 360bbb-3(b)(1), unless the authorization is  terminated or revoked. Performed at [facility]  [facility], [address] [place], [place] [address]   Surgical pcr screen     Status: Abnormal   Collection Time: 12/24/19  1:31 AM   Specimen: Nasal Mucosa; Nasal Swab  Result Value Ref Range Status   MRSA, PCR POSITIVE (A) NEGATIVE Final    Comment: RESULT CALLED TO, READ BACK BY AND VERIFIED WITH: A NEVIUS RN 12/24/19 0524 JDW    Staphylococcus aureus POSITIVE (A) NEGATIVE Final    Comment: (NOTE) The Xpert SA Assay (FDA approved for NASAL specimens in patients 46 years of age and older), is one component of a comprehensive surveillance program. It is not intended to diagnose infection nor to guide or monitor treatment. Performed at [facility], [place] [address] [place], [place] [address]      Discharge Instructions:   Discharge Instructions    Diet - low sodium heart healthy   Complete by: As directed    Discharge instructions   Complete by: As directed    As per rehabilitation   Increase activity slowly   Complete by: As directed      Allergies  as of 12/31/2019      Reactions   Ibuprofen Other (See Comments)   Patient said "the little brown pills" that he bought from the store made him talk out of his head (reported by [facility])   Other Other (See Comments)   Unknown reaction to "Dihydroxyalaminum aminoacetate magnesium carbonate" (reported by [facility]      Medication List    STOP taking these medications   chlorthalidone 25 MG tablet Commonly known as: HYGROTON   clopidogrel 75 MG tablet Commonly known as: PLAVIX   GOODY HEADACHE PO     TAKE these medications   acetaminophen 325 MG tablet Commonly known as: TYLENOL Take 1-2 tablets (325-650 mg total) by mouth every 4 (four) hours as needed for mild pain (or temp >/= 101 F).   amLODipine 5 MG tablet Commonly known as: NORVASC Take 5 mg by mouth daily.   aspirin 81 MG EC tablet Take 1 tablet (81 mg total) by mouth daily. Start taking on: January 01, 2020   atorvastatin 20 MG tablet Commonly known as: LIPITOR Take 1 tablet (20 mg total) by mouth daily at 6 PM.   buPROPion 100 MG 12 hr tablet Commonly known as: WELLBUTRIN SR Take 100 mg by mouth 2 (two) times daily.   cyanocobalamin 1000 MCG tablet Take 1 tablet (1,000 mcg total) by mouth daily. Start taking on: January 01, 2020   docusate sodium 100 MG capsule Commonly known as: COLACE Take 1 capsule (100 mg total) by mouth 2 (two) times daily.   feeding supplement (ENSURE ENLIVE) Liqd Take 237 mLs by mouth 2 (two) times daily between meals.   ferrous sulfate 325 (65 FE) MG tablet Take 325 mg by mouth daily.   folic acid 1 MG tablet Commonly known as: FOLVITE Take 1 mg by mouth daily.   gabapentin 300 MG capsule Commonly known as: NEURONTIN Take 1 capsule (300 mg total) by mouth 3 (three) times daily. What changed: how much to take   guaiFENesin-dextromethorphan 100-10 MG/5ML syrup Commonly known as: ROBITUSSIN DM Take 15 mLs by mouth every 4 (four) hours as needed for  cough.   lisinopril 20 MG tablet Commonly known as: ZESTRIL Take 20 mg by mouth daily.   metoprolol tartrate 25 MG tablet Commonly known as: LOPRESSOR Take 25 mg by mouth 2 (two) times daily.   naltrexone  50 MG tablet Commonly known as: DEPADE Take 50 mg by mouth daily.   oxyCODONE 5 MG immediate release tablet Commonly known as: Oxy IR/ROXICODONE Take 1-2 tablets (5-10 mg total) by mouth every 4 (four) hours as needed for moderate pain.   pantoprazole 40 MG tablet Commonly known as: PROTONIX Take 1 tablet (40 mg total) by mouth daily. Start taking on: January 01, 2020   polyethylene glycol 17 g packet Commonly known as: MIRALAX / GLYCOLAX Take 17 g by mouth daily as needed for mild constipation.   Thera-M Tabs Take 1 tablet by mouth daily.   thiamine 100 MG tablet Take 1 tablet (100 mg total) by mouth daily. Start taking on: January 01, 2020   vancomycin 750 MG/150ML Soln Commonly known as: VANCOREADY Inject 150 mLs (750 mg total) into the vein every 12 (twelve) hours.      Follow-up Information    Early, Arvilla Meres, MD.   Specialties: Vascular Surgery, Cardiology Contact information: [place] [address] [phone]        Waynetta Sandy, MD Follow up in 3 week(s).   Specialties: Vascular Surgery, Cardiology Contact information: [place] [address] [phone]           Time coordinating discharge: 39 minutes  Signed:  Amela Handley  [facility] 12/31/2019, 1:29 PM

## 2019-12-31 NOTE — Progress Notes (Signed)
Patient admitted about 1800 to 4W10. Patient educated on rehab process, therapy, fall safety. All questions answered. Vitals stable. Call bell and belongings left within reach.  Wounds noted:  Rt anterior tibia ulcer/lesion black 4.5x4  Lt side of Rt big toe/foot ulcer/lesion black 3x3 Rt side of Rt heel ulcer/lesion black 3x2 Rt side of foot sole ?DTI red/purple 1.5x1.5 Callus on foot pad.  Lt buttocks stage II 7.2x6 pink/yellow Coccyx stage II 1.5x1 pink/yellow Rt buttocks -6 areas of breakdown 0.7x0.5 1x0.7  0.5x0.5 0.6x0.5 0.2x0.2 pink 0.2x0.2 pink  New Sacral foam applied.

## 2019-12-31 NOTE — Progress Notes (Signed)
Physical Therapy Treatment Patient Details Name: Terrence Carr MRN: [id number] DOB: 17-Dec-1961 Today's Date: 12/31/2019    History of Present Illness Terrence Carr is a 58 y.o. male with medical history significant for hypertension, peripheral arterial occlusive disease, tobacco use disorder, alcohol abuse, peripheral neuropathy, vitamin B12 deficiency and history of severe protein calorie malnutrition who presents to the emergency department due to 3-week history of progressive and worsening wounds to bilateral lower extremities. S/P LAKA 12/24. Pt underwent revascularization of RLE on 12/30    PT Comments    Pt tolerated treatment well, transferring OOB with 1 assist and attempting multiple stands. Pt also rolls 5-6 times during session to assist with hygiene needs and dressing changes. Pt continues to demonstrate significant weakness and reduced R knee flexion since RLE revascularization, greatly increasing his falls risk. Pt will continue to benefit from acute PT POC to improve strength and balance and reduce falls risk.   Follow Up Recommendations  CIR     Equipment Recommendations  (defer to post-acute)    Recommendations for Other Services       Precautions / Restrictions Precautions Precautions: Fall Restrictions Weight Bearing Restrictions: Yes LLE Weight Bearing: Non weight bearing    Mobility  Bed Mobility Overal bed mobility: Needs Assistance Bed Mobility: Supine to Sit;Rolling Rolling: Supervision   Supine to sit: Min assist;HOB elevated     General bed mobility comments: heavy use of rails and increased time for bed mobility  Transfers Overall transfer level: Needs assistance Equipment used: Rolling walker (2 wheeled) Transfers: Sit to/from [employer] Sit to Stand: Max assist;From elevated surface   Squat pivot transfers: Max assist     General transfer comment: pt performs squat pivot transfer to drop arm recliner with PT assist  and knee block. Pt performs sit to stand with use of RW from recliner, PT blocking R foot as it has a tendency to slip forward due to poor knee flexion  Ambulation/Gait                 Stairs             Wheelchair Mobility    Modified Rankin (Stroke Patients Only)       Balance Overall balance assessment: Needs assistance Sitting-balance support: Bilateral upper extremity supported;Feet supported Sitting balance-Leahy Scale: Fair Sitting balance - Comments: minG-close supervision for 5 minutes     Standing balance-Leahy Scale: Zero Standing balance comment: maxA with BUE support of RW                            Cognition Arousal/Alertness: Awake/alert Behavior During Therapy: WFL for tasks assessed/performed Overall Cognitive Status: No family/caregiver present to determine baseline cognitive functioning                                        Exercises      General Comments General comments (skin integrity, edema, etc.): VSS, pt upset about lines and is impulsive. Often reporting pain from buttocks wound      Pertinent Vitals/Pain Pain Assessment: Faces Faces Pain Scale: Hurts whole lot Pain Location: buttocks Pain Descriptors / Indicators: Grimacing;Guarding;Tender;Sore Pain Intervention(s): Limited activity within patient's tolerance    Home Living                      Prior Function  PT Goals (current goals can now be found in the care plan section) Acute Rehab PT Goals Patient Stated Goal: improve mobility Progress towards PT goals: Progressing toward goals    Frequency    Min 3X/week      PT Plan Current plan remains appropriate    Co-evaluation              AM-PAC PT "6 Clicks" Mobility   Outcome Measure  Help needed turning from your back to your side while in a flat bed without using bedrails?: None Help needed moving from lying on your back to sitting on the side of a  flat bed without using bedrails?: A Little Help needed moving to and from a bed to a chair (including a wheelchair)?: Total Help needed standing up from a chair using your arms (e.g., wheelchair or bedside chair)?: Total Help needed to walk in hospital room?: Total Help needed climbing 3-5 steps with a railing? : Total 6 Click Score: 11    End of Session Equipment Utilized During Treatment: Gait belt Activity Tolerance: Patient tolerated treatment well Patient left: in chair;with call bell/phone within reach(no chair alarms present on floor, RN and Network engineer aware) Nurse Communication: Mobility status;Need for lift equipment PT Visit Diagnosis: Unsteadiness on feet (R26.81);Other abnormalities of gait and mobility (R26.89);Muscle weakness (generalized) (M62.81);Difficulty in walking, not elsewhere classified (R26.2);Pain     Time: [id number] PT Time Calculation (min) (ACUTE ONLY): 48 min  Charges:  $Therapeutic Activity: 38-52 mins                     Zenaida Niece, PT, DPT Acute Rehabilitation Pager: [phone]    Zenaida Niece 12/31/2019, 11:50 AM

## 2019-12-31 NOTE — Progress Notes (Signed)
Inpatient Rehabilitation-Admissions Coordinator   I have received medical clearance for admit to CIR today by Dr. Louanne Belton. Pt is wanting to pursue CIR as well. We reviewed insurance letter and consent forms. RN aware and [facility] will notify [facility] team regarding plan for admit.   Please call if questions.   Raechel Ache, OTR/L  Rehab Admissions Coordinator  [phone] 12/31/2019 1:24 PM

## 2019-12-31 NOTE — Progress Notes (Addendum)
[facility] [place]  Subjective  - Soreness in right leg.   Objective (!) 118/48 70 98.4 F (36.9 C) (Oral) 17 96%  Intake/Output Summary (Last 24 hours) at 12/31/2019 0723 Last data filed at 12/31/2019 0548 Gross per 24 hour  Intake 1561.14 ml  Output 2270 ml  Net -708.86 ml    Doppler signals DP/PT/Peroneal monophasic intact right LE Groin soft without hematoma, leg incisions healing well Right foot wounds unchanged, heel pad in place. Left AKA healing well Lungs non labored breathing   Assessment/Planning: POD # 1 Procedure Performed: 1.  Reexposure right common femoral artery greater than 30 days 2.  Right common femoral profundofemoral endarterectomy 3.  Harvest right greater saphenous vein 4.  Right common femoral to below-knee popliteal artery bypass with ipsilateral, translocated and reversed greater saphenous vein  Pending discharge plan possible CIR for rehab Stable for discharge to CIR from a vascular view Anemia of unknown cause 8.4 from 11 pre-op with EBL of 100 ml.  Will need repeat labs.    Roxy Horseman 12/31/2019 7:23 AM --  Laboratory Lab Results: Recent Labs    12/30/19 1417 12/31/19 0311  WBC 11.0* 9.0  HGB 11.0* 8.4*  HCT 33.5* 24.3*  PLT 701* 629*   BMET Recent Labs    12/30/19 0319 12/30/19 1417 12/31/19 0311  NA 136  --  135  K 4.1  --  4.2  CL 103  --  102  CO2 25  --  25  GLUCOSE 100*  --  112*  BUN 10  --  10  CREATININE 0.68 0.68 0.75  CALCIUM 8.3*  --  8.5*    COAG Lab Results  Component Value Date   INR 0.9 12/30/2019   INR 1.0 12/28/2019   No results found for: PTT  I have independently interviewed and examined patient and agree with PA assessment and plan above. Strong signals at ankle and right foot is warm. [place] for rehab and will need to follow for a few days.   Ashelynn Marks C. Donzetta Matters, MD [facility] [place] Office: [phone] Pager:  [phone]

## 2020-01-01 ENCOUNTER — Inpatient Hospital Stay (HOSPITAL_COMMUNITY): Payer: Medicaid Other | Admitting: Occupational Therapy

## 2020-01-01 ENCOUNTER — Inpatient Hospital Stay (HOSPITAL_COMMUNITY): Payer: Medicaid Other | Admitting: Physical Therapy

## 2020-01-01 DIAGNOSIS — Z89612 Acquired absence of left leg above knee: Secondary | ICD-10-CM

## 2020-01-01 DIAGNOSIS — L89322 Pressure ulcer of left buttock, stage 2: Secondary | ICD-10-CM | POA: Diagnosis present

## 2020-01-01 LAB — COMPREHENSIVE METABOLIC PANEL
ALT: 27 U/L (ref 0–44)
AST: 48 U/L — ABNORMAL HIGH (ref 15–41)
Albumin: 1.6 g/dL — ABNORMAL LOW (ref 3.5–5.0)
Alkaline Phosphatase: 132 U/L — ABNORMAL HIGH (ref 38–126)
Anion gap: 8 (ref 5–15)
BUN: 11 mg/dL (ref 6–20)
CO2: 26 mmol/L (ref 22–32)
Calcium: 8.2 mg/dL — ABNORMAL LOW (ref 8.9–10.3)
Chloride: 101 mmol/L (ref 98–111)
Creatinine, Ser: 0.85 mg/dL (ref 0.61–1.24)
GFR calc Af Amer: >60 mL/min (ref >60)
GFR calc non Af Amer: >60 mL/min (ref >60)
Glucose, Bld: 105 mg/dL — ABNORMAL HIGH (ref 70–99)
Potassium: 4.1 mmol/L (ref 3.5–5.1)
Sodium: 135 mmol/L (ref 135–145)
Total Bilirubin: 0.5 mg/dL (ref 0.3–1.2)
Total Protein: 5.3 g/dL — ABNORMAL LOW (ref 6.5–8.1)

## 2020-01-01 LAB — CBC WITH DIFFERENTIAL/PLATELET
Abs Immature Granulocytes: 0.04 10*3/uL (ref 0.00–0.07)
Basophils Absolute: 0.1 10*3/uL (ref 0.0–0.1)
Basophils Relative: 1 %
Eosinophils Absolute: 0.1 10*3/uL (ref 0.0–0.5)
Eosinophils Relative: 1 %
HCT: 24.5 % — ABNORMAL LOW (ref 39.0–52.0)
Hemoglobin: 8.1 g/dL — ABNORMAL LOW (ref 13.0–17.0)
Immature Granulocytes: 1 %
Lymphocytes Relative: 20 %
Lymphs Abs: 1.4 10*3/uL (ref 0.7–4.0)
MCH: 31.6 pg (ref 26.0–34.0)
MCHC: 33.1 g/dL (ref 30.0–36.0)
MCV: 95.7 fL (ref 80.0–100.0)
Monocytes Absolute: 1 10*3/uL (ref 0.1–1.0)
Monocytes Relative: 14 %
Neutro Abs: 4.4 10*3/uL (ref 1.7–7.7)
Neutrophils Relative %: 63 %
Platelets: 699 10*3/uL — ABNORMAL HIGH (ref 150–400)
RBC: 2.56 MIL/uL — ABNORMAL LOW (ref 4.22–5.81)
RDW: 17 % — ABNORMAL HIGH (ref 11.5–15.5)
WBC: 6.9 10*3/uL (ref 4.0–10.5)
nRBC: 0 % (ref 0.0–0.2)

## 2020-01-01 NOTE — Progress Notes (Signed)
   VASCULAR SURGERY ASSESSMENT & PLAN:   POD 2 RIGHT FEM BK POP: He has brisk Doppler signals in the dorsalis pedis and posterior tibial positions on the right.  The wounds on the right foot and leg are unchanged.  These can be followed as an outpatient.  VASCULAR QUALITY INITIATIVE: He is on aspirin and a statin.  DVT PROPHYLAXIS: He is on subcu heparin.  SUBJECTIVE:   No specific complaints.  PHYSICAL EXAM:   Vitals:   12/31/19 1853 12/31/19 1902 12/31/19 1925 01/01/20 0302  BP: (!) 131/103 (!) 161/68 125/62 138/69  Pulse: 67 65 (!) 58 69  Resp: 17  17 19   Temp: 98.1 F (36.7 C)  97.9 F (36.6 C) 98.5 F (36.9 C)  SpO2: 100%  100% 100%  Weight: 61.1 kg     Height: 5\' 5"  (1.651 m)      He has a brisk dorsalis pedis and posterior tibial signal with the Doppler. His incisions look fine. He has dry gangrene on the pretibial area, lateral right heel, and right great toe.  There is no significant erythema or drainage associated with these wounds.  LABS:   Lab Results  Component Value Date   WBC 6.9 01/01/2020   HGB 8.1 (L) 01/01/2020   HCT 24.5 (L) 01/01/2020   MCV 95.7 01/01/2020   PLT 699 (H) 01/01/2020   Lab Results  Component Value Date   CREATININE 0.85 01/01/2020   Lab Results  Component Value Date   INR 0.9 12/30/2019   PROBLEM LIST:    Active Problems:   Left above-knee amputee (HCC)  CURRENT MEDS:   . aspirin EC  81 mg Oral Daily  . atorvastatin  20 mg Oral q1800  . docusate sodium  100 mg Oral BID  . feeding supplement (ENSURE ENLIVE)  237 mL Oral BID BM  . folic acid  1 mg Oral Daily  . gabapentin  100 mg Oral TID  . heparin  5,000 Units Subcutaneous Q8H  . multivitamin with minerals  1 tablet Oral Daily  . nutrition supplement (JUVEN)  1 packet Oral BID BM  . pantoprazole  40 mg Oral Daily  . polyethylene glycol  17 g Oral Daily  . thiamine  100 mg Oral Daily  . vitamin B-12  1,000 mcg Oral Daily   Deitra Mayo Office:  [phone] 01/01/2020

## 2020-01-01 NOTE — Evaluation (Signed)
Physical Therapy Assessment and Plan  Patient Details  Name: Terrence Carr MRN: [id number] Date of Birth: February 11, 1961  PT Diagnosis: Abnormal posture, Abnormality of gait, Coordination disorder, Difficulty walking, Impaired cognition, Impaired sensation, Muscle spasms, Muscle weakness and Pain in joint Rehab Potential: Good ELOS: 7-10days   Today's Date: 01/01/2020 PT Individual Time: 1000-1055 PT Individual Time Calculation (min): 55 min    Problem List:  Patient Active Problem List   Diagnosis Date Noted  . Pressure ulcer of left buttock, stage 2 ([place]) 01/01/2020  . Left above-knee amputee ([place]) 12/31/2019  . S/P AKA (above knee amputation) unilateral, left ([place])   . Osteomyelitis of multiple sites ([place])   . Thrombocytosis ([place]) 12/24/2019  . Hypoglycemia 12/24/2019  . Osteomyelitis of ankle or foot, acute, right ([place]) 12/23/2019  . Lactic acidosis 12/23/2019  . Alcohol abuse 12/23/2019  . Alcohol withdrawal ([place]) 12/23/2019  . Macrocytic anemia 12/23/2019  . Sacral decubitus ulcer 12/23/2019  . Hyperglycemia 12/23/2019  . Essential hypertension 12/23/2019  . Peripheral arterial disease ([place]) 12/23/2019  . Vitamin B12 deficiency 12/23/2019    Past Medical History:  Past Medical History:  Diagnosis Date  . Hypertension    Past Surgical History:  Past Surgical History:  Procedure Laterality Date  . AMPUTATION Left 12/24/2019   Procedure: LEFT ABOVE KNEE AMPUTATION;  Surgeon: Rosetta Posner, MD;  Location: [facility];  Service: Vascular;  Laterality: Left;  . FEMORAL-POPLITEAL BYPASS GRAFT Right 12/30/2019   Procedure: RIGHT FEMORAL-POPLITEAL ARTERY BYPASS GRAFT;  Surgeon: Waynetta Sandy, MD;  Location: [facility];  Service: Vascular;  Laterality: Right;  . LOWER EXTREMITY ANGIOGRAPHY Right 12/28/2019   Procedure: LOWER EXTREMITY ANGIOGRAPHY;  Surgeon: Waynetta Sandy, MD;  Location: [facility] [facility];  Service: Cardiovascular;  Laterality: Right;     Assessment & Plan Clinical Impression: Patient is a 59 year old R handed male with history of hypertension, tobacco and alcohol use, vitamin B12 deficiency and peripheral vascular disease with right femoral enterectomy and right iliac artery stent placement by Dr. Radene Knee in March 2020 maintained on Plavix. Per chart review patient lives with girlfriend stepdaughter and her boyfriend. Independent with assistive device prior to admission. 1 level home with one-step to entry. Family provides assistance as needed. Presented 12/23/2019 to [facility] with acute onset progressive worsening of wounds to bilateral lower extremities x3 weeks left greater than right. X-rays concerning for osteomyelitis at the base of the first distal phalanx of the right foot as well as left distal fibular metadiaphysis and was started on IV antibiotics. He was transferred to [facility] for further evaluation. 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 Donnetta Hutching. Patient with follow-up evaluation of right lower extremity. ABIs nondetectable. Underwent right lower extremity angiogram subsequently followed by right common femoral profundofemoral enterectomy, right common femoral to below-knee popliteal artery bypass with ipsilateral, translocated nonreversed greater saphenous vein 12/30/2019 per Dr. Donzetta Matters. Hospital course pain management. Subcutaneous heparin for DVT prophylaxis. Hospital course anemia 6.7 transfuse latest hemoglobin 8.4. Noted left buttocks pressure ulceration stage II with wound care as directed.   Patient transferred to CIR on 12/31/2019 .   Patient currently requires mod with mobility secondary to muscle weakness and muscle joint tightness, decreased cardiorespiratoy endurance, decreased coordination, decreased attention, decreased awareness, decreased problem solving, decreased safety awareness, decreased memory  and delayed processing and decreased sitting balance, decreased standing balance, decreased postural control, decreased balance strategies and difficulty maintaining  precautions.  Prior to hospitalization, patient was modified independent  with mobility and lived with   in a House home.  Home access is 1Stairs to enter.  Patient will benefit from skilled PT intervention to maximize safe functional mobility, minimize fall risk and decrease caregiver burden for planned discharge home with 24 hour supervision.  Anticipate patient will benefit from follow up [place] at discharge.  PT - End of Session Activity Tolerance: Tolerates 10 - 20 min activity with multiple rests Endurance Deficit: Yes PT Assessment Rehab Potential (ACUTE/IP ONLY): Good PT Barriers to Discharge: Inaccessible home environment;Decreased caregiver support;Medical stability;Wound Care;Lack of/limited family support;Insurance for SNF coverage;Weight bearing restrictions;Medication compliance;Behavior PT Barriers to Discharge Comments: poor safety awareness, poor self care and medication management PT Patient demonstrates impairments in the following area(s): Balance;Behavior;Edema;Endurance;Motor;Perception;Pain;Safety;Sensory PT Transfers Functional Problem(s): Bed Mobility;Bed to Chair;Furniture;Car;Floor PT Locomotion Functional Problem(s): Ambulation;Wheelchair Mobility;Stairs PT Plan PT Intensity: Minimum of 1-2 x/day ,45 to 90 minutes PT Frequency: 5 out of 7 days PT Duration Estimated Length of Stay: 7-10days PT Treatment/Interventions: Ambulation/gait training;Discharge planning;Functional mobility training;Psychosocial support;Therapeutic Activities;Visual/perceptual remediation/compensation;Wheelchair propulsion/positioning;Therapeutic Exercise;Skin care/wound management;Neuromuscular re-education;Disease management/prevention;Balance/vestibular training;Cognitive remediation/compensation;DME/adaptive equipment  instruction;Splinting/orthotics;UE/LE Strength taining/ROM;Pain management;Community reintegration;Functional electrical stimulation;Stair training;Patient/family education;UE/LE Coordination activities PT Transfers Anticipated Outcome(s): supervision assist via SB PT Locomotion Anticipated Outcome(s): WC level with supervision assist PT Recommendation Recommendations for Other Services: Neuropsych consult Follow Up Recommendations: Home health PT Patient destination: Home Equipment Recommended: Wheelchair (measurements);Wheelchair cushion (measurements)  Skilled Therapeutic Intervention Pt received supine in bed and agreeable to PT. Supine>sit transfer with min assist and cues for NWB through the LLE. Pt verbally agitated when PT attempting to assist with bed mobility. Squat pivot to [facility] with min-mod assist and mod cues for set up and safety, but initially resistance to assistance, but then agreeable with one near LOB. PT instructed patient in PT Evaluation and initiated treatment intervention; see below for results. PT educated patient in [place], rehab potential, rehab goals, and discharge recommendations. Pt refused to attempt standing, car transfer, ambulation or formal MMT. Unable to perform stairs due to safety awareness deficits and RLE strength deficits. Spoke with MD and will continue to assess need for offloading shoe for wound management on the RLE. Patient returned to room and left sitting in [facility] with call bell in reach and all needs met.       PT Evaluation Precautions/Restrictions   fall. multiple wounds on BLE.   Pain Pain Assessment Pain Scale: 0-10 Pain Score: 7  Pain Type: Surgical pain Pain Location: Leg Pain Orientation: Right Pain Descriptors / Indicators: Aching Pain Onset: On-going Pain Intervention(s): Medication (See eMAR) Home Living/Prior Functioning Home Living Available Help at Discharge: Family;Available 24 hours/day Type of Home: House Home Access: Stairs to  enter [employer] of Steps: 1 Home Layout: One level Bathroom Shower/Tub: Multimedia programmer: Standard Additional Comments: patient sponge bathes Prior Function  Able to Take Stairs?: Yes Comments: rollator. pt also reports that house has ramp access. Vision/Perception  Perception Perception: Within Functional Limits Praxis Praxis: Intact  Cognition Overall Cognitive Status: No family/caregiver present to determine baseline cognitive functioning Orientation Level: Oriented to person;Oriented to place;Oriented to situation;Disoriented to time Awareness: Impaired Problem Solving: Impaired Safety/Judgment: Impaired Comments: poor safety awareness, mildly agitated at times, impulsive. Sensation Sensation Light Touch: Impaired Detail Light Touch Impaired Details: Impaired RLE Coordination Gross Motor Movements are Fluid and Coordinated: No Fine Motor Movements are Fluid and Coordinated: Yes Coordination and Movement Description: poor trunk control and use of RLE with  functional transfers. Motor  Motor Motor: Other (comment) Motor - Skilled Clinical Observations: generalized weakness and poor postural control  Mobility Bed Mobility Bed Mobility: Rolling Right;Rolling Left;Sit to Supine;Supine to Sit Rolling Right: Minimal Assistance - Patient > 75% Rolling Left: Minimal Assistance - Patient > 75% Supine to Sit: Minimal Assistance - Patient > 75% Sit to Supine: Minimal Assistance - Patient > 75% Transfers Transfers: Corporate treasurer Transfers: Moderate Assistance - Patient 50-74% Transfer (Assistive device): None Locomotion  Gait Ambulation: No Gait Gait: No Stairs / Additional Locomotion Stairs: No Wheelchair Mobility Wheelchair Mobility: Yes Wheelchair Assistance: Chartered loss adjuster: Both upper extremities Wheelchair Parts Management: Needs assistance Distance: 141f  Trunk/Postural Assessment   Cervical Assessment Cervical Assessment: Within Functional Limits Thoracic Assessment Thoracic Assessment: Exceptions to WWolf Eye Associates PaLumbar Assessment Lumbar Assessment: Within Functional Limits Postural Control Postural Control: Deficits on evaluation Protective Responses: delayed. Postural Limitations: significant deficits noted sitting EOB and without UE support  Balance Balance Balance Assessed: Yes Static Sitting Balance Static Sitting - Balance Support: Bilateral upper extremity supported Static Sitting - Level of Assistance: 5: Stand by assistance Dynamic Sitting Balance Dynamic Sitting - Balance Support: Bilateral upper extremity supported Dynamic Sitting - Level of Assistance: 4: Min assist Sitting balance - Comments: poor righting reactions and poor trunk control when adjusting UE for transfer set up, Static Standing Balance Static Standing - Comment/# of Minutes: pt refused Dynamic Standing Balance Dynamic Standing - Comments: pt refused Extremity Assessment      RLE Assessment RLE Assessment: Exceptions to WTerre Haute Regional HospitalGeneral Strength Comments: grossly 3+/5 due to pain. pt declines formal testing. LLE Assessment LLE Assessment: Exceptions to WGreat [facility] Clinic Surgery [facility] LLCGeneral Strength Comments: grosslt 3+/5 in hip noted through movement in bed.    Refer to Care Plan for Long Term Goals  Recommendations for other services: Neuropsych  Discharge Criteria: Patient will be discharged from PT if patient refuses treatment 3 consecutive times without medical reason, if treatment goals not met, if there is a change in medical status, if patient makes no progress towards goals or if patient is discharged from hospital.  The above assessment, treatment plan, treatment alternatives and goals were discussed and mutually agreed upon: by patient  ALorie Phenix1/01/2020, 12:23 PM

## 2020-01-01 NOTE — Progress Notes (Signed)
Physical Therapy Session Note  Patient Details  Name: Terrence Carr MRN: [id number] Date of Birth: 1961/11/17  Today's Date: 01/01/2020 PT Individual Time: 1630-1703 PT Individual Time Calculation (min): 33 min   Short Term Goals: Week 1:  PT Short Term Goal 1 (Week 1): STG=LTG due to ELOS  Skilled Therapeutic Interventions/Progress Updates:   Pt received sitting in WC and agreeable to PT. Pt transported to ortho-gym in [place]. UBE 5 min forward 4 min reverse, level 6.5.  Cues for increased speed with very poor carryover following verbal and tactile instruction from PT. Following UBE training, pt again declines to attempt standing in parallel bar, with RW or perform car transfer, and states that he is done for the day. When PT encourages additional exercise, pt states that he will, "get back it in in the morning."  PT unable to persuade pt to perform additional therex or therapeutic activity. Pt performed WC mobility training through hall back to room with distant supervision assist with min cues for doorway management. Pt left sitting in WC with call bell in reach and all needs met.       Therapy Documentation Precautions:  Precautions Precautions: Fall Precaution Comments: Lt AKA, wounds R LE and Lt buttocks Restrictions Weight Bearing Restrictions: Yes RLE Weight Bearing: Weight bearing as tolerated LLE Weight Bearing: Non weight bearing General: PT Amount of Missed Time (min): 28 Minutes PT Missed Treatment Reason: Patient unwilling to participate Vital Signs: Therapy Vitals Temp: 97.9 F (36.6 C) Temp Source: Oral Pulse Rate: 70 Resp: 18 BP: 104/62 Patient Position (if appropriate): Sitting Oxygen Therapy SpO2: 98 % O2 Device: Room Air Pain: Pain Assessment Pain Score: 7   Balance: Dynamic Sitting Balance Dynamic Sitting - Balance Support: During functional activity Dynamic Sitting - Level of Assistance: 5: Stand by assistance(donning pants via lateral  leans) Dynamic Standing Balance Dynamic Standing - Comments: pt refused to attempt    Therapy/Group: Individual Therapy  Lorie Phenix 01/01/2020, 5:04 PM

## 2020-01-01 NOTE — H&P (Signed)
Physical Medicine and Rehabilitation Admission H&P     Chief Complaint  Patient presents with  . Wound Check  :  HPI: Terrence Carr is a 59 year old R handed male with history of hypertension, tobacco and alcohol use, vitamin B12 deficiency and peripheral vascular disease with right femoral enterectomy and right iliac artery stent placement by Dr. Radene Knee in March 2020 maintained on Plavix. Per chart review patient lives with girlfriend stepdaughter and her boyfriend. Independent with assistive device prior to admission. 1 level home with one-step to entry. Family provides assistance as needed. Presented 12/23/2019 to [facility] with acute onset progressive worsening of wounds to bilateral lower extremities x3 weeks left greater than right. X-rays concerning for osteomyelitis at the base of the first distal phalanx of the right foot as well as left distal fibular metadiaphysis and was started on IV antibiotics. He was transferred to [facility] for further evaluation. 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 Donnetta Hutching. Patient with follow-up evaluation of right lower extremity. ABIs nondetectable. Underwent right lower extremity angiogram subsequently followed by right common femoral profundofemoral enterectomy, right common femoral to below-knee popliteal artery bypass with ipsilateral, translocated nonreversed greater saphenous vein 12/30/2019 per Dr. Donzetta Matters. Hospital course pain management. Subcutaneous heparin for DVT prophylaxis. Hospital course anemia 6.7 transfuse latest hemoglobin 8.4. Noted left buttocks pressure ulceration stage II with wound care as directed. Therapy evaluations completed and patient was admitted for a comprehensive rehab program.  Pain "pretty good"- sitting up- better with sitting up.  Pain meds work well- but is "due now".  Review of Systems  Constitutional: Negative  for chills and fever.  Poor appetite  HENT: Negative for hearing loss.  Eyes: Negative for blurred vision and double vision.  Respiratory: Negative for cough and shortness of breath.  Cardiovascular: Positive for leg swelling. Negative for chest pain and palpitations.  Gastrointestinal: Positive for constipation. Negative for heartburn, nausea and vomiting.  Genitourinary: Positive for urgency. Negative for dysuria, flank pain and hematuria.  Musculoskeletal: Positive for joint pain and myalgias.  Skin: Negative for rash.  All other systems reviewed and are negative.       Past Medical History:  Diagnosis Date  . Hypertension         Past Surgical History:  Procedure Laterality Date  . AMPUTATION Left 12/24/2019   Procedure: LEFT ABOVE KNEE AMPUTATION; Surgeon: Rosetta Posner, MD; Location: [place]; Service: Vascular; Laterality: Left;  . LOWER EXTREMITY ANGIOGRAPHY Right 12/28/2019   Procedure: LOWER EXTREMITY ANGIOGRAPHY; Surgeon: Waynetta Sandy, MD; Location: [place] [facility]; Service: Cardiovascular; Laterality: Right;   History reviewed. No pertinent family history.  Social History: reports that he has never smoked. He has never used smokeless tobacco. He reports that he does not drink alcohol or use drugs. Actually said was smoking 1/2 ppd and drinking 1/2 pint at least of vodka daily since retired.  Allergies:       Allergies  Allergen Reactions  . Ibuprofen Other (See Comments)    Patient said "the little brown pills" that he bought from the store made him talk out of his head (reported by [facility])  . Other Other (See Comments)    Unknown reaction to "Dihydroxyalaminum aminoacetate magnesium carbonate" (reported by [facility]         Medications Prior to Admission  Medication Sig Dispense Refill  . Aspirin-Acetaminophen-Caffeine (GOODY HEADACHE PO) Take by mouth.    Marland Kitchen  amLODipine (NORVASC) 5 MG tablet Take 5 mg by mouth daily.    Marland Kitchen  buPROPion (WELLBUTRIN SR) 100 MG 12 hr tablet Take 100 mg by mouth 2 (two) times daily.    . chlorthalidone (HYGROTON) 25 MG tablet Take 25 mg by mouth every morning.    . clopidogrel (PLAVIX) 75 MG tablet Take 75 mg by mouth daily.    . ferrous sulfate 325 (65 FE) MG tablet Take 325 mg by mouth daily.    . folic acid (FOLVITE) 1 MG tablet Take 1 mg by mouth daily.    Marland Kitchen gabapentin (NEURONTIN) 300 MG capsule Take 600 mg by mouth 3 (three) times daily.    Marland Kitchen lisinopril (ZESTRIL) 20 MG tablet Take 20 mg by mouth daily.    . metoprolol tartrate (LOPRESSOR) 25 MG tablet Take 25 mg by mouth 2 (two) times daily.    . Multiple Vitamins-Minerals (THERA-M) TABS Take 1 tablet by mouth daily.    . naltrexone (DEPADE) 50 MG tablet Take 50 mg by mouth daily.     Drug Regimen Review  Drug regimen was reviewed and remains appropriate with no significant issues identified.  Home:  Home Living  Family/patient expects to be discharged to:: Private residence  Living Arrangements: (girlfriend, Rozella and "stepdtr", Graylon Good and her boyfriend)  Available Help at Discharge: Mamie [place], grand dtr with pt and his GF when Graylon Good is working)  Type of Home: House  Home Access: Stairs to enter  Technical brewer of Steps: 2(family to build a ramp)  Home Layout: One level  Bathroom Shower/Tub: Tourist information centre manager: Standard  Bathroom Accessibility: Yes  Home Equipment: Environmental consultant - 4 wheels, Shower seat  Additional Comments: patient sponge bathes  Lives With: Significant other, Other (Comment)  Functional History:  Prior Function  Level of Independence: Independent with assistive device(s)  Comments: RW  Functional Status:  Mobility:  Bed Mobility  Overal bed mobility: Needs Assistance  Bed Mobility: Supine to Sit  Rolling: Mod assist  Sidelying to sit: Mod assist, HOB elevated  Supine to sit: Mod assist, HOB elevated  General bed mobility comments: +rail, increased time, cues for sequencing, use of  bed pad to scoot to EOB  Transfers  Overall transfer level: Needs assistance  Equipment used: Rolling walker (2 wheeled)  Transfers: Sit to/from Stand  Sit to Stand: Max assist, +2 physical assistance  General transfer comment: Pt with difficulty with fully upright needed cues to try to stand up straight by tucking his buttocks    ADL:  ADL  Overall ADL's : Needs assistance/impaired  Eating/Feeding: Independent, Sitting  Grooming: Set up, Sitting  Upper Body Bathing: Set up, Sitting  Lower Body Bathing: Maximal assistance  Lower Body Bathing Details (indicate cue type and reason): Max A +2 sit<>stand  Upper Body Dressing : Minimal assistance, Sitting  Lower Body Dressing: Total assistance  Lower Body Dressing Details (indicate cue type and reason): Max A +2 sit<>stand  Toilet Transfer: Maximal assistance, +2 for physical assistance, RW, Stand-pivot  Toilet Transfer Details (indicate cue type and reason): Bed>recliner on pt's right  ToiletingRunner, broadcasting/film/video and Hygiene: Total assistance  Toileting - Clothing Manipulation Details (indicate cue type and reason): Max A +2 sit<>stand  Cognition:  Cognition  Overall Cognitive Status: Impaired/Different from baseline  Orientation Level: Oriented to person, Disoriented to place, Disoriented to time, Disoriented to situation  Cognition  Arousal/Alertness: Awake/alert  Behavior During Therapy: WFL for tasks assessed/performed  Overall Cognitive Status: Impaired/Different from baseline  Area  of Impairment: Safety/judgement  Safety/Judgement: Decreased awareness of safety  Physical Exam:  Blood pressure 139/75, pulse 77, temperature 98.1 F (36.7 C), resp. rate 13, height 5' 5.98" (1.676 m), weight 55.3 kg, SpO2 94 %.  Physical Exam  Nursing note and vitals reviewed.  Constitutional: No distress.  Cachetic, sitting up in chair at bedside, on IV Vanc, NAD, stool under fingernails  HENT:  Head: Normocephalic and atraumatic.    Mouth/Throat: No oropharyngeal exudate.  Patient with poor dentition  Eyes: Pupils are equal, round, and reactive to light. EOM are normal. No scleral icterus.  Neck: No tracheal deviation present.  Cardiovascular:  Bradycardic but regular rhythm  Respiratory:  CTA B/L, no W/R/R  GI:  Soft, NT, ND< (+) hypoactive BS- pt doesn't remember LBM- but was this AM  Musculoskeletal:  Cervical back: Normal range of motion and neck supple.  Comments: RUE 4+/5- deltoid, biceps, triceps, WE, grip, finger abd  LUE- deltoid 2+/5, otherwise 4+/5 in LUE RLE- at least 3+/5- wouldn't push secondary to "wounds" LLE- HF 3+/5- L AKA L shoulder painful per pt- but nothing specific on palpation- "has been out of socket before".  Neurological: He is alert. No cranial nerve deficit.  Patient is alert. Is able to write his name and age but has some difficulty with recall he continued to request discharge to home. AO x 2- doesn't know date/day- is a little confused Sensation intact on L AKA Sensation to LT decreased below knee on RLE  Skin: No rash noted. He is not diaphoretic. No erythema.  Left AKA with staples in place no drainage, no erythema- looks great- (+) dog ears. Bypass sites lower extremities dressed with palpable pulses R shin wound and R top of foot have dressing- wounds bleeding through- also wasn't able to assess wound on L buttock- supposedly is Stage II  Psychiatric:  Bright affect; slightly confused   Lab Results Last 48 Hours        Results for orders placed or performed during the hospital encounter of 12/23/19 (from the past 48 hour(s))  Basic metabolic panel Status: Abnormal   Collection Time: 12/29/19 2:48 AM  Result Value Ref Range   Sodium 135 135 - 145 mmol/L   Potassium 3.9 3.5 - 5.1 mmol/L   Chloride 101 98 - 111 mmol/L   CO2 25 22 - 32 mmol/L   Glucose, Bld 111 (H) 70 - 99 mg/dL   BUN 8 6 - 20 mg/dL   Creatinine, Ser 0.78 0.61 - 1.24 mg/dL   Calcium 7.9 (L) 8.9 - 10.3  mg/dL   GFR calc non Af Amer >60 >60 mL/min   GFR calc Af Amer >60 >60 mL/min   Anion gap 9 5 - 15    Comment: Performed at [facility], [place] [address], [place] [address]  CBC Status: Abnormal   Collection Time: 12/29/19 2:48 AM  Result Value Ref Range   WBC 5.5 4.0 - 10.5 K/uL   RBC 2.05 (L) 4.22 - 5.81 MIL/uL   Hemoglobin 6.7 (LL) 13.0 - 17.0 g/dL    Comment: REPEATED TO VERIFY  THIS CRITICAL RESULT HAS VERIFIED AND BEEN CALLED TO RN KARI DAVIDSON BY MESSAN HOUEGNIFIO ON 12 29 2020 AT 0426, AND HAS BEEN READ BACK.    HCT 20.1 (L) 39.0 - 52.0 %   MCV 98.0 80.0 - 100.0 fL   MCH 32.7 26.0 - 34.0 pg   MCHC 33.3 30.0 - 36.0 g/dL   RDW 16.8 (H)  11.5 - 15.5 %   Platelets 421 (H) 150 - 400 K/uL   nRBC 0.0 0.0 - 0.2 %    Comment: Performed at [facility], [place] [address] [place], [place] [address]  Type and screen [place] Status: None (Preliminary result)   Collection Time: 12/29/19 5:21 AM  Result Value Ref Range   ABO/RH(D) O POS    Antibody Screen NEG    Sample Expiration 01/01/2020,2359    Unit Number [id number]    Blood Component Type RED CELLS,LR    Unit division 00    Status of Unit ALLOCATED    Transfusion Status OK TO TRANSFUSE    Crossmatch Result Compatible    Unit Number [id number]    Blood Component Type RED CELLS,LR    Unit division 00    Status of Unit ALLOCATED    Transfusion Status OK TO TRANSFUSE    Crossmatch Result Compatible    Unit Number [id number]    Blood Component Type RED CELLS,LR    Unit division 00    Status of Unit ISSUED,FINAL    Transfusion Status OK TO TRANSFUSE    Crossmatch Result Compatible    Unit Number [id number]    Blood Component Type RED CELLS,LR    Unit division 00    Status of Unit ISSUED,FINAL    Transfusion Status OK TO TRANSFUSE    Crossmatch Result      Compatible  Performed at [facility], [place] [address], [place], [place] [address]   Prepare RBC Status: None    Collection Time: 12/29/19 5:21 AM  Result Value Ref Range   Order Confirmation      ORDER PROCESSED BY BLOOD BANK  Performed at [facility], [place] [address], [place], [place] [address]   Occult blood card to lab, stool RN will collect Status: None   Collection Time: 12/29/19 4:22 PM  Result Value Ref Range   Fecal Occult Bld NEGATIVE NEGATIVE    Comment: Performed at [facility], [place] [address], [place], [place] [address]  CBC Status: Abnormal   Collection Time: 12/29/19 7:59 PM  Result Value Ref Range   WBC 6.8 4.0 - 10.5 K/uL   RBC 3.00 (L) 4.22 - 5.81 MIL/uL   Hemoglobin 9.7 (L) 13.0 - 17.0 g/dL    Comment: REPEATED TO VERIFY  POST TRANSFUSION SPECIMEN    HCT 28.0 (L) 39.0 - 52.0 %   MCV 93.3 80.0 - 100.0 fL   MCH 32.3 26.0 - 34.0 pg   MCHC 34.6 30.0 - 36.0 g/dL   RDW 18.3 (H) 11.5 - 15.5 %   Platelets 500 (H) 150 - 400 K/uL   nRBC 0.0 0.0 - 0.2 %    Comment: Performed at [facility], [place] [address] [place], [place] [address]  Protime-INR Status: None   Collection Time: 12/30/19 3:19 AM  Result Value Ref Range   Prothrombin Time 12.5 11.4 - 15.2 seconds   INR 0.9 0.8 - 1.2    Comment: (NOTE)  INR goal varies based on device and disease states.  Performed at [facility], [place] [address], [place], [place]  [id number]   Basic metabolic panel Status: Abnormal   Collection Time: 12/30/19 3:19 AM  Result Value Ref Range   Sodium 136 135 - 145 mmol/L   Potassium 4.1 3.5 - 5.1 mmol/L   Chloride 103 98 - 111 mmol/L   CO2 25 22 - 32  mmol/L   Glucose, Bld 100 (H) 70 - 99 mg/dL   BUN 10 6 - 20 mg/dL   Creatinine, Ser 0.68 0.61 - 1.24 mg/dL   Calcium 8.3 (L) 8.9 - 10.3 mg/dL   GFR calc non Af Amer >60 >60 mL/min   GFR calc Af Amer >60 >60 mL/min   Anion gap 8 5 - 15    Comment: Performed at [place] [address] [place], [place] [address]  CBC Status: Abnormal   Collection Time: 12/30/19 3:19 AM  Result Value Ref Range   WBC 6.6 4.0 -  10.5 K/uL   RBC 2.95 (L) 4.22 - 5.81 MIL/uL   Hemoglobin 9.4 (L) 13.0 - 17.0 g/dL   HCT 27.7 (L) 39.0 - 52.0 %   MCV 93.9 80.0 - 100.0 fL   MCH 31.9 26.0 - 34.0 pg   MCHC 33.9 30.0 - 36.0 g/dL   RDW 18.2 (H) 11.5 - 15.5 %   Platelets 531 (H) 150 - 400 K/uL   nRBC 0.0 0.0 - 0.2 %    Comment: Performed at [facility] [address] [place], [place] [address]   Imaging Results (Last 48 hours)    Medical Problem List and Plan:  1. Decreased functional ability secondary to peripheral vascular disease status post left AKA 12/24/2019 as well as right femoral-popliteal bypass 12/30/2019  -patient may shower if L AKA is covered  -ELOS/Goals: Mod I to min assist- 10-14 days  2. Antithrombotics:  -DVT/anticoagulation: Subcutaneous heparin  -antiplatelet therapy: Aspirin 81 mg daily  3. Pain Management: Neurontin 100 mg 3 times daily, oxycodone as needed  4. Mood: Provide emotional support  -antipsychotic agents: N/A  5. Neuropsych: This patient is capable of making decisions on his own behalf.  6. Skin/Wound Care- wounds on RLE, groin from R fempop bypass and Stage II on L buttocks: Routine skin checks/sacral foam prophylactic dressing change every third day- needs dressing changes for RLE as well.  7. Fluids/Electrolytes/Nutrition: Routine in and outs with follow-up chemistries  8. Acute blood loss anemia. Follow-up CBC  -last CBC showed Hb of 8.4- down from 11 after surgery- will monitor  9. Hypertension. Monitor with increased mobility  10. Hyperlipidemia. Lipitor  11. History of tobacco alcohol use- at least 1/2ppd- . Provide counseling.  12. Vitamin B12 deficiency. Continue supplement  13. Constipation. Laxative assistance  14. Alcohol abuse- pt finally admitted since retirement was drinking "1/2 pint every day of vodka"- monitor for withdrawal- likely drinking more.  15. Severe protein malnutrition- BMI is 20- but albumin is 1.6- which is significant and total protein is 5.7-  will con't protein supplements, and if loses more weight, will consult nutrition.  16. L shoulder weakness/discomfort- will d/w OT in how it relates to function.  17. Ostemyelitis? - Pt is on Vanc- doesn't need Vanc anymore per vascualr physicians since had L AKA.  Lavon Paganini Angiulli, PA-C  12/30/2019  I have personally performed a face to face diagnostic evaluation of this patient and formulated the key components of the plan. Additionally, I have personally reviewed laboratory data, imaging studies, as well as relevant notes and concur with the physician assistant's documentation above.  The patient's status has not changed from the original H&P. Any changes in documentation from the acute care chart have been noted above.

## 2020-01-01 NOTE — Progress Notes (Signed)
[place] PHYSICAL MEDICINE & REHABILITATION PROGRESS NOTE   Subjective/Complaints:  Pt reports he's frustrated about wounds on backside- however doesn't like to turn when nursing asks.  Asking for cup of coffee.   ROS- pt denies SOB, CP, Abd pain, N/V/D, constipation, visual changes,  Objective:   No results found. Recent Labs    12/31/19 0311 01/01/20 0519  WBC 9.0 6.9  HGB 8.4* 8.1*  HCT 24.3* 24.5*  PLT 629* 699*   Recent Labs    12/31/19 0311 01/01/20 0519  NA 135 135  K 4.2 4.1  CL 102 101  CO2 25 26  GLUCOSE 112* 105*  BUN 10 11  CREATININE 0.75 0.85  CALCIUM 8.5* 8.2*    Intake/Output Summary (Last 24 hours) at 01/01/2020 0945 Last data filed at 01/01/2020 0801 Gross per 24 hour  Intake 360 ml  Output 1200 ml  Net -840 ml     Physical Exam: Vital Signs Blood pressure 138/69, pulse 69, temperature 98.5 F (36.9 C), resp. rate 19, height 5\' 5"  (1.651 m), weight 61.1 kg, SpO2 100 %.  .  Physical Exam  Nursing note and vitals reviewed.  Constitutional: No distress.  Cachetic, laying on L side in bed with nursing at bedside, NAD HENT:  Head: Normocephalic and atraumatic.  Mouth/Throat: No oropharyngeal exudate.  Patient with poor dentition - has ~ 5-6 teeth total Eyes: EOMs intact Neck: No tracheal deviation present.  Cardiovascular:  RRR  Respiratory:  CTA B/L, no W/R/R  GI:  Soft, NT, ND, (+) BS Musculoskeletal:  Cervical back: Normal range of motion and neck supple.  Comments: RUE 4+/5- deltoid, biceps, triceps, WE, grip, finger abd  LUE- deltoid 2+/5, otherwise 4+/5 in LUE RLE- at least 3+/5- wouldn't push secondary to "wounds" LLE- HF 3+/5- L AKA L shoulder painful per pt- but nothing specific on palpation- "has been out of socket before".  Neurological:. AO x 2- doesn't know date/day- is a little confused Sensation intact on L AKA Sensation to LT decreased below knee on RLE  Skin: No rash noted. He is not diaphoretic. No erythema.   Left AKA with staples in place no drainage, no erythema- looks great- (+) dog ears. Bypass sites lower extremities dressed with palpable pulses R shin wound and R top of foot have dressing- wounds bleeding through- Buttocks- on R buttock- multiple areas slightly open- like previous pimples or moisture? Sacrum slightly open- appears due to moisture- <1cm x 1cm; L buttock has pressure ulcer - superficially open- pink, with a little slough in middle- ~ 5-7 cm Psychiatric:  Frustrated about coffee; slightly confused    Assessment/Plan: 1. Functional deficits secondary to L AKA and fempop bypass which require 3+ hours per day of interdisciplinary therapy in a comprehensive inpatient rehab setting.  Physiatrist is providing close team supervision and 24 hour management of active medical problems listed below.  Physiatrist and rehab team continue to assess barriers to discharge/monitor patient progress toward functional and medical goals  Care Tool:  Bathing              Bathing assist       Upper Body Dressing/Undressing Upper body dressing        Upper body assist      Lower Body Dressing/Undressing Lower body dressing            Lower body assist       Toileting Toileting    Toileting assist       Transfers Chair/bed transfer  Transfers assist           Locomotion Ambulation   Ambulation assist              Walk 10 feet activity   Assist           Walk 50 feet activity   Assist           Walk 150 feet activity   Assist           Walk 10 feet on uneven surface  activity   Assist           Wheelchair     Assist               Wheelchair 50 feet with 2 turns activity    Assist            Wheelchair 150 feet activity     Assist          Blood pressure 138/69, pulse 69, temperature 98.5 F (36.9 C), resp. rate 19, height 5\' 5"  (1.651 m), weight 61.1 kg, SpO2 100 %.  Medical Problem  List and Plan:  1. Decreased functional ability secondary to peripheral vascular disease status post left AKA 12/24/2019 as well as right femoral-popliteal bypass 12/30/2019  -patient may shower if L AKA is covered  -ELOS/Goals: Mod I to min assist- 10-14 days  2. Antithrombotics:  -DVT/anticoagulation: Subcutaneous heparin  -antiplatelet therapy: Aspirin 81 mg daily  3. Pain Management: Neurontin 100 mg 3 times daily, oxycodone as needed  4. Mood: Provide emotional support  -antipsychotic agents: N/A  5. Neuropsych: This patient is capable of making decisions on his own behalf.  6. Skin/Wound Care- wounds on RLE, groin from R fempop bypass and Stage II on L buttocks: Routine skin checks/sacral foam prophylactic dressing change every third day- needs dressing changes for RLE as well.  1/1- L buttock pressure ulcer, sacral/coccyx looks like moisture- <1x1cm- few spots on R buttock- doesn't appear to be pressure- will consult WOC  7. Fluids/Electrolytes/Nutrition: Routine in and outs with follow-up chemistries   1/1- good results in BMP today 8. Acute blood loss anemia. Follow-up CBC  -last CBC showed Hb of 8.4- down from 11 after surgery- will monitor  9. Hypertension. Monitor with increased mobility  10. Hyperlipidemia. Lipitor  11. History of tobacco alcohol use- at least 1/2ppd- . Provide counseling.  12. Vitamin B12 deficiency. Continue supplement  13. Constipation. Laxative assistance  14. Alcohol abuse- pt finally admitted since retirement was drinking "1/2 pint every day of vodka"- monitor for withdrawal- likely drinking more.  15. Severe protein malnutrition- BMI is 20- but albumin is 1.6- which is significant and total protein is 5.7- will con't protein supplements, and if loses more weight, will consult nutrition.  16. L shoulder weakness/discomfort- will d/w OT in how it relates to function.  9. Ostemyelitis? - Pt was on Vanc- doesn't need Vanc anymore per vascular physicians  since had L AKA.     LOS: 1 days A FACE TO FACE EVALUATION WAS PERFORMED  [place] Date 01/01/2020, 9:45 AM

## 2020-01-01 NOTE — Progress Notes (Signed)
Izora Ribas, MD  Physician  Physical Medicine and Rehabilitation  Consult Note  Addendum  Date of Service:  12/26/2019  4:07 PM      Related encounter: ED to Hosp-Admission (Discharged) from 12/23/2019 in [facility] 4E CV SURGICAL [place]            Physical Medicine and Rehabilitation Consult Reason for Consult: Impaired mobility and ADLs Referring Physician: Dr. Berle Mull     HPI: Terrence Carr is a 59 y.o. male with PMH of HTN, PAD, active smoker, alcohol abuse, peripheral neuropathy, B12 deficiency, severe protein calorie malnutrition, who presented with bilateral leg wounds and pain progressively worsening over the past 3 weeks and was found to have extensive gangrene of the left foot, now postop day 2 from a left AKA for gangrene of the left foot by Dr. Donnetta Hutching (vascular surgery). Healing well. Staples to remain for 3 weeks. Pending decision regarding right foot limb salvage based on results of ABIs and possible arteriogram.    ROS + pain at surgical site, generalized body ache, minimal oral intake. Otherwise negative.      Past Medical History:  Diagnosis Date  . Hypertension      Past Surgical History:  Procedure Laterality Date  . AMPUTATION Left 12/24/2019    Procedure: LEFT ABOVE KNEE AMPUTATION;  Surgeon: Rosetta Posner, MD;  Location: [place];  Service: Vascular;  Laterality: Left;    History reviewed. No pertinent family history. Social History:  reports that he has never smoked. He has never used smokeless tobacco. He reports that he does not drink alcohol or use drugs. Allergies: Not on File       Medications Prior to Admission  Medication Sig Dispense Refill  . amLODipine (NORVASC) 5 MG tablet Take 5 mg by mouth daily.      Marland Kitchen buPROPion (WELLBUTRIN SR) 100 MG 12 hr tablet Take 100 mg by mouth 2 (two) times daily.      . chlorthalidone (HYGROTON) 25 MG tablet Take 25 mg by mouth every morning.      . clopidogrel  (PLAVIX) 75 MG tablet Take 75 mg by mouth daily.      . cyanocobalamin 1000 MCG tablet Take by mouth.      . ferrous sulfate 325 (65 FE) MG tablet Take 325 mg by mouth daily.      . folic acid (FOLVITE) 1 MG tablet Take 1 mg by mouth daily.      Marland Kitchen gabapentin (NEURONTIN) 300 MG capsule Take 600 mg by mouth 3 (three) times daily.      Marland Kitchen lisinopril (ZESTRIL) 20 MG tablet Take 20 mg by mouth daily.      . metoprolol tartrate (LOPRESSOR) 25 MG tablet Take 25 mg by mouth 2 (two) times daily.      . Multiple Vitamins-Minerals (THERA-M) TABS Take 1 tablet by mouth daily.      . naltrexone (DEPADE) 50 MG tablet Take 50 mg by mouth daily.      . nicotine (NICODERM CQ - DOSED IN MG/24 HOURS) 14 mg/24hr patch Place 14 mg onto the skin daily.       . nitroGLYCERIN (NITROSTAT) 0.4 MG SL tablet Place under the tongue.      . thiamine 100 MG tablet Take by mouth.          Home: Home Living Family/patient expects to be discharged to:: Private residence Living Arrangements: Spouse/significant other, Other relatives Available Help  at Discharge: Family, Available 24 hours/day Type of Home: House Home Access: Stairs to enter [employer] of Steps: 1 Home Layout: One level Bathroom Shower/Tub: Multimedia programmer: Standard Home Equipment: Environmental consultant - 4 wheels, Shower seat  Functional History: Prior Function Level of Independence: Independent with assistive device(s) Comments: RW Functional Status:  Mobility: Bed Mobility Overal bed mobility: Needs Assistance Bed Mobility: Rolling, Sidelying to Sit Rolling: Mod assist Sidelying to sit: Mod assist, HOB elevated General bed mobility comments: VCs for technique use of bed pad Transfers Overall transfer level: Needs assistance Equipment used: Rolling walker (2 wheeled) Transfers: Sit to/from Stand Sit to Stand: Max assist, +2 physical assistance General transfer comment: Pt with difficulty with fully upright needed cues to try to  stand up straight by tucking his buttocks   ADL: ADL Overall ADL's : Needs assistance/impaired Eating/Feeding: Independent, Sitting Grooming: Set up, Sitting Upper Body Bathing: Set up, Sitting Lower Body Bathing: Maximal assistance Lower Body Bathing Details (indicate cue type and reason): Max A +2 sit<>stand Upper Body Dressing : Minimal assistance, Sitting Lower Body Dressing: Total assistance Lower Body Dressing Details (indicate cue type and reason): Max A +2 sit<>stand Toilet Transfer: Maximal assistance, +2 for physical assistance, RW, Stand-pivot Toilet Transfer Details (indicate cue type and reason): Bed>recliner on pt's right ToiletingRunner, broadcasting/film/video and Hygiene: Total assistance Toileting - Clothing Manipulation Details (indicate cue type and reason): Max A +2 sit<>stand   Cognition: Cognition Overall Cognitive Status: Impaired/Different from baseline Orientation Level: Oriented to person, Oriented to place, Oriented to situation, Disoriented to time Cognition Arousal/Alertness: Awake/alert Behavior During Therapy: WFL for tasks assessed/performed Overall Cognitive Status: Impaired/Different from baseline Area of Impairment: Safety/judgement Safety/Judgement: Decreased awareness of safety(with transfers)   Blood pressure 116/64, pulse 86, temperature 98.6 F (37 C), temperature source Oral, resp. rate 18, height 5' 5.98" (1.676 m), weight 74.8 kg, SpO2 100 %.    Physical Exam  General: Alert and oriented x 3, No apparent distress HEENT: Head is normocephalic, atraumatic, PERRLA, EOMI, sclera anicteric, oral mucosa pink and moist, dentition intact, ext ear canals clear,  Neck: Supple without JVD or lymphadenopathy Heart: Reg rate and rhythm. No murmurs rubs or gallops Chest: CTA bilaterally without wheezes, rales, or rhonchi; no distress Abdomen: Soft, non-tender, non-distended, bowel sounds positive. Extremities: No clubbing, cyanosis, or edema. Pulses are  2+ Skin: Clean and intact without signs of breakdown Neuro/MSK: Alert, decreased safety awareness. Impaired memory. L AKA staples C/D/I. Right wounds on shin and 1st metatarsal head.   Psych: Belligerent at first, did not want to be disturbed, more agreeable later on.    Lab Results Last 24 Hours       Results for orders placed or performed during the hospital encounter of 12/23/19 (from the past 24 hour(s))  Basic metabolic panel     Status: Abnormal    Collection Time: 12/26/19  7:27 AM  Result Value Ref Range    Sodium 131 (L) 135 - 145 mmol/L    Potassium 3.9 3.5 - 5.1 mmol/L    Chloride 101 98 - 111 mmol/L    CO2 21 (L) 22 - 32 mmol/L    Glucose, Bld 145 (H) 70 - 99 mg/dL    BUN 6 6 - 20 mg/dL    Creatinine, Ser 0.70 0.61 - 1.24 mg/dL    Calcium 7.6 (L) 8.9 - 10.3 mg/dL    GFR calc non Af Amer >60 >60 mL/min    GFR calc Af Amer >60 >60 mL/min  Anion gap 9 5 - 15  CBC     Status: Abnormal    Collection Time: 12/26/19  7:27 AM  Result Value Ref Range    WBC 4.7 4.0 - 10.5 K/uL    RBC 2.19 (L) 4.22 - 5.81 MIL/uL    Hemoglobin 7.3 (L) 13.0 - 17.0 g/dL    HCT 21.7 (L) 39.0 - 52.0 %    MCV 99.1 80.0 - 100.0 fL    MCH 33.3 26.0 - 34.0 pg    MCHC 33.6 30.0 - 36.0 g/dL    RDW 17.2 (H) 11.5 - 15.5 %    Platelets 409 (H) 150 - 400 K/uL    nRBC 0.0 0.0 - 0.2 %       Imaging Results (Last 48 hours)  VAS [place] ABI WITH/WO TBI   Result Date: 12/26/2019 LOWER EXTREMITY DOPPLER STUDY Indications: Gangrene.  Vascular Interventions: Left AKA. Comparison Study: No prior study. Performing Technologist: Maudry Mayhew MHA, RVT, RDCS, RDMS  Examination Guidelines: A complete evaluation includes at minimum, Doppler waveform signals and systolic blood pressure reading at the level of bilateral brachial, anterior tibial, and posterior tibial arteries, when vessel segments are accessible. Bilateral testing is considered an integral part of a complete examination. Photoelectric Plethysmograph  (PPG) waveforms and toe systolic pressure readings are included as required and additional duplex testing as needed. Limited examinations for reoccurring indications may be performed as noted.  ABI Findings: +--------+------------------+-----+-------------------+--------+ Right   Rt Pressure (mmHg)IndexWaveform           Comment  +--------+------------------+-----+-------------------+--------+ Brachial120                    triphasic                   +--------+------------------+-----+-------------------+--------+ PTA                            dampened monophasic         +--------+------------------+-----+-------------------+--------+ DP                             dampened monophasic         +--------+------------------+-----+-------------------+--------+ +--------+------------------+-----+--------+-------------+ Left    Lt Pressure (mmHg)IndexWaveformComment       +--------+------------------+-----+--------+-------------+ Brachial                               Not evaluated +--------+------------------+-----+--------+-------------+  Summary: Right: Abnormal right pedal waveforms suggestive of possible severe arterial insufficiency. Patient unable to tolerate blood pressure cuff, therefore unable to obtain right ABI.  *See table(s) above for measurements and observations.    Preliminary          Assessment/Plan: Diagnosis: Impaired mobility and ADLs 1. Does the need for close, 24 hr/day medical supervision in concert with the patient's rehab needs make it unreasonable for this patient to be served in a less intensive setting? Yes 2. Co-Morbidities requiring supervision/potential complications: lactic acidosis, alcohol abuse and withdrawal, macrocytic anemia, sacral decubitus ulcer, hyperglycemia, essential HTN, PAD, vitamin B12 deficiency, thrombocytosis, hypoglycemia  3. Due to bladder management, bowel management, safety, skin/wound care, disease management,  medication administration, pain management and patient education, does the patient require 24 hr/day rehab nursing? Yes 4. Does the patient require coordinated care of a physician, rehab nurse, therapy disciplines of PT, OT, SLP to address physical and functional deficits in  the context of the above medical diagnosis(es)? Yes Addressing deficits in the following areas: balance, endurance, locomotion, strength, transferring, bowel/bladder control, bathing, dressing, feeding, grooming, toileting, cognition and psychosocial support 5. Can the patient actively participate in an intensive therapy program of at least 3 hrs of therapy per day at least 5 days per week? Yes 6. The potential for patient to make measurable gains while on inpatient rehab is good 7. Anticipated functional outcomes upon discharge from inpatient rehab are S with PT, S with OT, modified independent with SLP. 8. Estimated rehab length of stay to reach the above functional goals is: 10-14 days 9. Anticipated discharge destination: Home 10. Overall Rehab/Functional Prognosis: excellent and good   RECOMMENDATIONS: This patient's condition is appropriate for continued rehabilitative care in the following setting: CIR Patient has agreed to participate in recommended program. Yes Note that insurance prior authorization may be required for reimbursement for recommended care.   Comment: Mr. Garnier would be a good CIR candidate pending family support. Requires education regarding medication compliance. Goal of CIR is to progress from +2A to S at W/C level or better. Vascular team currently determining whether arteriogram and right foot limb salvage are appropriate.    Izora Ribas, MD 12/26/2019    Revision History                     Routing History

## 2020-01-01 NOTE — Evaluation (Signed)
Occupational Therapy Assessment and Plan  Patient Details  Name: Terrence Carr MRN: [id number] Date of Birth: 1961/07/01  OT Diagnosis: acute pain, cognitive deficits and muscle weakness (generalized) Rehab Potential: Rehab Potential (ACUTE ONLY): Poor ELOS: 7-10 days   Today's Date: 01/01/2020 OT Individual Time: 1300-1356 OT Individual Time Calculation (min): 56 min     Problem List:  Patient Active Problem List   Diagnosis Date Noted  . Pressure ulcer of left buttock, stage 2 ([place]) 01/01/2020  . Left above-knee amputee ([place]) 12/31/2019  . S/P AKA (above knee amputation) unilateral, left ([place])   . Osteomyelitis of multiple sites ([place])   . Thrombocytosis ([place]) 12/24/2019  . Hypoglycemia 12/24/2019  . Osteomyelitis of ankle or foot, acute, right ([place]) 12/23/2019  . Lactic acidosis 12/23/2019  . Alcohol abuse 12/23/2019  . Alcohol withdrawal ([place]) 12/23/2019  . Macrocytic anemia 12/23/2019  . Sacral decubitus ulcer 12/23/2019  . Hyperglycemia 12/23/2019  . Essential hypertension 12/23/2019  . Peripheral arterial disease ([place]) 12/23/2019  . Vitamin B12 deficiency 12/23/2019    Past Medical History:  Past Medical History:  Diagnosis Date  . Hypertension    Past Surgical History:  Past Surgical History:  Procedure Laterality Date  . AMPUTATION Left 12/24/2019   Procedure: LEFT ABOVE KNEE AMPUTATION;  Surgeon: Rosetta Posner, MD;  Location: [place];  Service: Vascular;  Laterality: Left;  . FEMORAL-POPLITEAL BYPASS GRAFT Right 12/30/2019   Procedure: RIGHT FEMORAL-POPLITEAL ARTERY BYPASS GRAFT;  Surgeon: Waynetta Sandy, MD;  Location: [place];  Service: Vascular;  Laterality: Right;  . LOWER EXTREMITY ANGIOGRAPHY Right 12/28/2019   Procedure: LOWER EXTREMITY ANGIOGRAPHY;  Surgeon: Waynetta Sandy, MD;  Location: [place] CV LAB;  Service: Cardiovascular;  Laterality: Right;    Assessment & Plan Clinical Impression: Terrence Carr is a 59 year old R  handed male with history of hypertension, tobacco and alcohol use, vitamin B12 deficiency and peripheral vascular disease with right femoral enterectomy and right iliac artery stent placement by Dr. Radene Knee in March 2020 maintained on Plavix.  Per chart review patient lives with girlfriend stepdaughter and her boyfriend.  Independent with assistive device prior to admission.  1 level home with one-step to entry.  Family provides assistance as needed.  Presented 12/23/2019 to [facility] with acute onset progressive worsening of wounds to bilateral lower extremities x3 weeks left greater than right.  X-rays concerning for osteomyelitis at the base of the first distal phalanx of the right foot as well as left distal fibular metadiaphysis and was started on IV antibiotics.  He was transferred to [facility] for further evaluation.  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 Donnetta Hutching.  Patient with follow-up evaluation of right lower extremity.  ABIs nondetectable.  Underwent right lower extremity angiogram subsequently followed by right common femoral profundofemoral enterectomy, right common femoral to below-knee popliteal artery bypass with ipsilateral, translocated nonreversed greater saphenous vein 12/30/2019 per Dr. Donzetta Matters.  Hospital course pain management.  Subcutaneous heparin for DVT prophylaxis.  Hospital course anemia 6.7 transfuse latest hemoglobin 8.4.  Noted left buttocks pressure ulceration stage II with wound care as directed.  Therapy evaluations completed and patient was admitted for a comprehensive rehab program.  Patient currently requires mod with basic self-care skills secondary to muscle weakness and agitation, decreased cardiorespiratoy endurance, decreased attention, decreased awareness, decreased problem solving, decreased safety awareness and decreased memory and decreased balance strategies.   Prior to  hospitalization, patient could complete BADLs with min.  Patient will benefit from skilled intervention to increase independence with basic self-care skills prior to discharge home with family.  Anticipate patient will require 24 hour supervision and minimal physical assistance and follow up home health.  OT - End of Session Endurance Deficit: Yes OT Assessment Rehab Potential (ACUTE ONLY): Poor OT Barriers to Discharge: Behavior;Wound Care OT Patient demonstrates impairments in the following area(s): Balance;Behavior;Cognition;Endurance;Motor;Pain;Safety;Skin Integrity OT Basic ADL's Functional Problem(s): Bathing;Dressing;Toileting OT Advanced ADL's Functional Problem(s): Simple Meal Preparation OT Transfers Functional Problem(s): Toilet;Tub/Shower OT Additional Impairment(s): None OT Plan OT Intensity: Minimum of 1-2 x/day, 45 to 90 minutes OT Frequency: 5 out of 7 days OT Duration/Estimated Length of Stay: 7-10 days OT Treatment/Interventions: Balance/vestibular training;DME/adaptive equipment instruction;Patient/family education;Therapeutic Activities;Wheelchair propulsion/positioning;Cognitive remediation/compensation;Psychosocial support;Therapeutic Exercise;UE/LE Strength taining/ROM;Self Care/advanced ADL retraining;Functional mobility training;Discharge planning;Neuromuscular re-education;Community reintegration;Skin care/wound managment;UE/LE Coordination activities;Disease mangement/prevention;Pain management OT Self Feeding Anticipated Outcome(s): No goal OT Basic Self-Care Anticipated Outcome(s): Supervision-Min A OT Toileting Anticipated Outcome(s): Min A OT Bathroom Transfers Anticipated Outcome(s): Steady assist OT Recommendation Recommendations for Other Services: Neuropsych consult Patient destination: Home Follow Up Recommendations: Home health OT Equipment Recommended: To be determined  Skilled Therapeutic Intervention Skilled OT session completed with  focus on initial evaluation, education on OT role/POC, and establishment of patient-centered goals.   Pt greeted in w/c, finishing up lunch and agreeable to tx. Stated he had 7/10 pain, unable to state where. RN in at start of session to provide pain medicine. He completed bathing/dressing tasks w/c level at the sink. Pt exhibited increased agitation when asked PLOF questions or when prompted to complete a step of the ADL. He was very concerned that OT was "watching" him. Calmly explained to pt role of OT and that CIR staff would provide supervision during mobility and self care completion to ensure his safety. Emphasized goal was to get stronger and gain more functional abilities so that he could go home with family. Pt appeared minimally receptive to education. He refused to complete pericare and simulated pericare tasks, appearing offended when asked to do so. He initiated using lateral leans for pulling paper pants over hips, needed A to thread them over R LE. Pt refused when OT offered to assist him, therefore significantly increased time required for pulling pants up. Set pt up to complete drop arm BSC transfer. Discussed squat pivot and slideboard techniques, with pt then requesting for his coffee to be reheated. OT asked if he could wait for coffee until after transfer, with pt becoming more agitated, swearing, and demanding coffee. Staff brought in his coffee and pt set himself up, adamant that he wanted the coffee placed on his nightstand vs bedside table. He used the urinal x3 during session with setup and OT standing behind curtain while he voided. Tried to engage pt in informal eval assessments with pt often refusing to attempt task asked of him. OT also asked if she could look at wounds on his Rt foot to assess need for special shoe with pt adamantly refusing for sock to be removed. Per pt: "Just leave me alone." Pt left in w/c with all needs within reach and safety belt fastened, still drinking his  coffee.     OT Evaluation Precautions/Restrictions  Precautions Precautions: Fall Precaution Comments: Lt AKA, wounds R LE and Lt buttocks General Chart Reviewed: Yes Family/Caregiver Present: No Vital Signs Therapy Vitals Temp: 97.9 F (36.6 C) Temp Source: Oral Pulse Rate: 70 Resp: 18 BP: 104/62 Patient Position (if appropriate):  Sitting Oxygen Therapy SpO2: 98 % O2 Device: Room Air Pain Pain Assessment Pain Scale: 0-10 Pain Score: 7  Pain Type: Acute pain Pain Location: Leg Pain Orientation: Right Pain Descriptors / Indicators: Aching Pain Onset: On-going Pain Intervention(s): Medication (See eMAR) Home Living/Prior Functioning Home Living Available Help at Discharge: Family, Available 24 hours/day Type of Home: House Home Access: Stairs to enter Technical brewer of Steps: 1 Home Layout: One level Bathroom Shower/Tub: Multimedia programmer: Standard Additional Comments: Pt reports home is w/c accessible IADL History Homemaking Responsibilities: No 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 He was unable to specify the amount of assistance he needed)  Able to Take Stairs?: Yes Comments: rollator. pt also reports that house has ramp access. ADL ADL Grooming: Setup Where Assessed-Grooming: Sitting at sink Upper Body Bathing: Supervision/safety Where Assessed-Upper Body Bathing: Standing at sink Lower Body Bathing: (Unable to assess due to pt refusal) Upper Body Dressing: Supervision/safety Where Assessed-Upper Body Dressing: Sitting at sink Lower Body Dressing: Moderate assistance  Where Assessed-Lower Body Dressing: Sitting at sink Toileting: Not assessed(due to pt refusal) Toilet Transfer: Not assessed(due to pt refusal) Tub/Shower  Transfer: Not assessed ADL Comments: Pt with very limited participation due to agitated behavior Vision Baseline Vision/History: No visual deficits Patient Visual Report: No change from baseline Perception  Perception: Within Functional Limits Praxis Praxis: Intact Cognition Overall Cognitive Status: No family/caregiver present to determine baseline cognitive functioning Arousal/Alertness: Awake/alert Orientation Level: Person;Situation(Pt unsure if he was at [facility], [place], or at a nursing home) Year: 2020 Month: December Day of Week: Correct Memory: Impaired Immediate Memory Recall: Sock;Blue;Bed Memory Recall Sock: Not able to recall Memory Recall Blue: Without Cue Memory Recall Bed: With Cue Attention: Sustained Sustained Attention: Impaired(He'd stop multiple times during LB dressing to look outside of the window, needed prompting to return to task at hand) Awareness: Impaired Problem Solving: Impaired Behaviors: Verbal agitation;Poor frustration tolerance;Impulsive Safety/Judgment: Impaired Comments: poor safety awareness, mildly agitated at times, impulsive. Sensation Coordination Gross Motor Movements are Fluid and Coordinated: No Fine Motor Movements are Fluid and Coordinated: Yes Finger Nose Finger Test: Dysmetria B UE Motor  Motor Motor: Other (comment)(Unable to thoroughly assess due to behaviors and refusal to participate) Mobility     Trunk/Postural Assessment     Balance Balance Balance Assessed: Yes Static Sitting Balance Static Sitting - Balance Support: Bilateral upper extremity supported Static Sitting - Level of Assistance: 5: Stand by assistance Dynamic Sitting Balance Dynamic Sitting - Balance Support: During functional activity Dynamic Sitting - Level of Assistance: 5: Stand by assistance(donning pants via lateral leans) Sitting balance - Comments: poor righting reactions and poor trunk control when adjusting UE for transfer set up, Static  Standing Balance Static Standing - Comment/# of Minutes: pt refused Dynamic Standing Balance Dynamic Standing - Comments: pt refused to attempt Extremity/Trunk Assessment RUE Assessment RUE Assessment: (Pt refusing to participate in R UE ROM testing) LUE Assessment LUE Assessment: (Pt refused to participate in L UE ROM testing)     Refer to Care Plan for Long Term Goals  Recommendations for other services: Neuropsych   Discharge Criteria: Patient will be discharged from OT if patient refuses treatment 3 consecutive times without medical reason, if treatment goals not met, if there is a change in medical status, if patient  makes no progress towards goals or if patient is discharged from hospital.  The above assessment, treatment plan, treatment alternatives and goals were discussed and mutually agreed upon: by patient  Skeet Simmer 01/01/2020, 3:06 PM

## 2020-01-01 NOTE — Progress Notes (Signed)
Courtney Heys, MD  Physician  Physical Medicine and Rehabilitation  PMR Pre-admission  Signed  Date of Service:  12/29/2019 12:28 PM      Related encounter: ED to Hosp-Admission (Discharged) from 12/23/2019 in [facility] 4E CV [place]      Signed       PMR Admission Coordinator Pre-Admission Assessment   Patient: Terrence Carr is an 59 y.o., male MRN: [id number] DOB: Dec 15, 1961 Height: 5' 5.98" (167.6 cm) Weight: 57.3 kg   Insurance Information HMO:     PPO:      PCP:      IPA:      80/20:      OTHER:  PRIMARY: Medicaid [place] Access      Policy#: [id number]      Subscriber: pt Benefits:  Phone #: passport one online     Name: 12/29 Eff. 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 Presented to [facility] on 12/23/2019 with 3 week history of progressive and worsening wounds to bilateral LES, with yellowish and malodorous discharge with severe pain to the wounds. PCP had prescribed antibiotics, but patient had not taken. Patient previously seen at [facility] as an outpatient 09/02/2019 and was noted to be S/P right femoral endarterectomy and right iliac artery stent placement by Dr. Radene Knee in May 2020 in which  he had no LE wounds.    s were within normal range.  Work-up in the ED showed macrocytic anemia,  Hypoglycemia, hyponatremia,  lactic acid 5.4> 2.1, alcohol level was 23.  COVID-19 virus test, influenza A and B were negative.  Right tibia and fibula showed diffuse edematous thickening of the RLE with a large ankle effusion and subcortical lucency at the distal tibial metaphysis worrisome for osteomyelitis.  Left foot x-ray showed soft tissue ulceration along the plantar aspect of the heel and posterolateral aspect of the foot.  Patient was started on IV cefepime and vancomycin.  Patient was placed on CIWA protocol and IV hydration of 1 L NS was given.  Dr. Donnetta Hutching (vascular surgeon at [facility]) was consulted per ED PA who states that it was recommended for patient to be transferred to [facility].   Dr. Donnetta Hutching performed L AKA on 12/24/2019. Patient underwent right lower extremity angiogram and subsequently underwent right femoropopliteal bypass on 12/30/2019.   Acute on chronic macrocytic anemia and sever B12 deficiency. Continued B12 injections and followed by oral. Continue folic acid. S/P 2 units PRBCs for Hgb 6.7 on 12/28.   Patient's medical record from [facility]  has been reviewed by the rehabilitation admission coordinator and physician.   Past Medical History  Past Medical History:  Diagnosis Date  . Hypertension        Family History   family history is not on file.   Prior Rehab/Hospitalizations Has the patient had prior rehab or hospitalizations prior to admission? Yes   Has the patient had major surgery during 100 days prior to admission? Yes              Current Medications   Current Facility-Administered Medications:  .  0.9 %  sodium chloride infusion, 500 mL, Intravenous, Once PRN, Rhyne, Samantha J, PA-C .  0.9 %  sodium chloride infusion, , Intravenous, Continuous, Rhyne, Samantha J, PA-C .  acetaminophen (TYLENOL) tablet 325-650 mg, 325-650 mg, Oral, Q4H PRN **OR**  acetaminophen (TYLENOL) suppository 325-650 mg, 325-650 mg, Rectal, Q4H PRN, Rhyne, Samantha J, PA-C .  alum & mag hydroxide-simeth (MAALOX/MYLANTA) 200-200-20 MG/5ML suspension 15-30 mL, 15-30 mL, Oral, Q2H PRN, Rhyne, Samantha J, PA-C .  aspirin EC tablet 81 mg, 81 mg, Oral, Daily, Rhyne, Samantha J, PA-C, 81 mg at 12/31/19 0949 .  atorvastatin (LIPITOR) tablet 20 mg, 20 mg, Oral, q1800, Rhyne, Samantha J, PA-C, 20 mg at 12/30/19 2321 .  bisacodyl (DULCOLAX) EC tablet 5 mg, 5 mg, Oral, Daily PRN, Rhyne, Samantha J, PA-C, 5 mg at 12/29/19 2243 .  docusate sodium (COLACE) capsule 100 mg, 100 mg, Oral, BID, Rhyne, Samantha J, PA-C, 100 mg at 12/31/19 0949 .  feeding supplement (ENSURE ENLIVE) (ENSURE ENLIVE) liquid 237 mL, 237 mL, Oral, BID BM, Rhyne, Samantha J, PA-C .  folic acid (FOLVITE) tablet 1 mg, 1 mg, Oral, Daily, Pokhrel, Laxman, MD, 1 mg at 12/31/19 0948 .  gabapentin (NEURONTIN) capsule 100 mg, 100 mg, Oral, TID, Rhyne, Samantha J, PA-C, 100 mg at 12/31/19 0949 .  guaiFENesin-dextromethorphan (ROBITUSSIN DM) 100-10 MG/5ML syrup 15 mL, 15 mL, Oral, Q4H PRN, Rhyne, Samantha J, PA-C .  heparin injection 5,000 Units, 5,000 Units, Subcutaneous, Q8H, Rhyne, Samantha J, PA-C, 5,000 Units at 12/31/19 9563 .  hydrALAZINE (APRESOLINE) injection 5 mg, 5 mg, Intravenous, Q20 Min PRN, Rhyne, Samantha J, PA-C .  labetalol (NORMODYNE) injection 10 mg, 10 mg, Intravenous, Q10 min PRN, Rhyne, Samantha J, PA-C .  magnesium sulfate IVPB 2 g 50 mL, 2 g, Intravenous, Daily PRN, Rhyne, Samantha J, PA-C .  metoprolol tartrate (LOPRESSOR) injection 2-5 mg, 2-5 mg, Intravenous, Q2H PRN, Rhyne, Samantha J, PA-C .  multivitamin with minerals tablet 1 tablet, 1 tablet, Oral, Daily, Pokhrel, Laxman, MD, 1 tablet at 12/31/19 0949 .  nutrition supplement (JUVEN) (JUVEN) powder packet 1 packet, 1 packet, Oral, BID BM, Rhyne, Hulen Shouts, PA-C, 1 packet at 12/31/19 [phone] .  ondansetron (ZOFRAN) injection 4 mg, 4 mg,  Intravenous, Q6H PRN, Rhyne, Samantha J, PA-C .  oxyCODONE (Oxy IR/ROXICODONE) immediate release tablet 5-10 mg, 5-10 mg, Oral, Q4H PRN, Rhyne, Samantha J, PA-C, 10 mg at 12/31/19 0527 .  pantoprazole (PROTONIX) EC tablet 40 mg, 40 mg, Oral, Daily, Rhyne, Samantha J, PA-C, 40 mg at 12/31/19 0949 .  phenol (CHLORASEPTIC) mouth spray 1 spray, 1 spray, Mouth/Throat, PRN, Rhyne, Samantha J, PA-C .  polyethylene glycol (MIRALAX / GLYCOLAX) packet 17 g, 17 g, Oral, Daily, Rhyne, Samantha J, PA-C, 17 g at 12/31/19 0948 .  potassium chloride SA (KLOR-CON) CR tablet 20-40 mEq, 20-40 mEq, Oral, Daily PRN, Rhyne, Samantha J, PA-C .  thiamine tablet 100 mg, 100 mg, Oral, Daily, 100 mg at 12/31/19 0948 **OR** [DISCONTINUED] thiamine (B-1) injection 100 mg, 100 mg, Intravenous, Daily, Setzer,  Edman Circle, PA-C .  vancomycin (VANCOREADY) IVPB 750 mg/150 mL, 750 mg, Intravenous, Q12H, Rhyne, Samantha J, PA-C, Last Rate: 150 mL/hr at 12/31/19 0956, 750 mg at 12/31/19 0956 .  vitamin B-12 (CYANOCOBALAMIN) tablet 1,000 mcg, 1,000 mcg, Oral, Daily, Rhyne, Samantha J, PA-C, 1,000 mcg at 12/31/19 8127   Patients Current Diet:     Diet Order                      Diet - low sodium heart healthy           Diet regular Room service appropriate? Yes; Fluid consistency: Thin  Diet effective now                   Precautions / Restrictions Precautions Precautions: Fall Restrictions Weight Bearing Restrictions: Yes RLE Weight Bearing: Weight bearing as tolerated LLE Weight Bearing: Non weight bearing    Has the patient had 2 or more falls or a fall with injury in the past year? No   Prior Activity Level Limited Community (1-2x/wk): Mod I with RW; does not drive   Prior Functional Level Self Care: Did the patient need help bathing, dressing, using the toilet or eating? Independent   Indoor Mobility: Did the patient need assistance with walking from room to room (with or without device)? Independent   Stairs:  Did the patient need assistance with internal or external stairs (with or without device)? Independent   Functional Cognition: Did the patient need help planning regular tasks such as shopping or remembering to take medications? Needed some help   Home Assistive Devices / [place] Devices/Equipment: None Home Equipment: Walker - 4 wheels, Shower seat   Prior Device Use: Indicate devices/aids used by the patient prior to current illness, exacerbation or injury? Walker   Current Functional Level Cognition   Overall Cognitive Status: No family/caregiver present to determine baseline cognitive functioning Orientation Level: Oriented to person, Oriented to time, Disoriented to place, Oriented to situation(knows he is having a procedure done thinks he is at a SNF) Safety/Judgement: Decreased awareness of safety    Extremity Assessment (includes Sensation/Coordination)   Upper Extremity Assessment: Defer to OT evaluation  Lower Extremity Assessment: LLE deficits/detail, RLE deficits/detail RLE Deficits / Details: strength grossly 3+/5, ROM in hip and knee lacking full extension RLE Sensation: decreased light touch RLE Coordination: decreased fine motor LLE Deficits / Details: L AKA     ADLs   Overall ADL's : Needs assistance/impaired Eating/Feeding: Independent, Sitting Grooming: Set up, Sitting Upper Body Bathing: Set up, Sitting Lower Body Bathing: Maximal assistance Lower Body Bathing Details (indicate cue type and reason): Max A +2 sit<>stand Upper Body Dressing : Minimal assistance, Sitting Lower Body Dressing: Total assistance Lower Body Dressing Details (indicate cue type and reason): Max A +2 sit<>stand Toilet Transfer: Maximal assistance, +2 for physical assistance, RW, Stand-pivot Toilet Transfer Details (indicate cue type and reason): Bed>recliner on pt's right ToiletingRunner, broadcasting/film/video and Hygiene: Total assistance Toileting - Clothing Manipulation  Details (indicate cue type and reason): Max A +2 sit<>stand     Mobility   Overal bed mobility: Needs Assistance Bed Mobility: Supine to Sit, Rolling Rolling: Supervision Sidelying to sit: Mod assist, HOB elevated Supine to sit: Min assist, HOB elevated General bed mobility comments: heavy use of rails and increased time for bed mobility     Transfers   Overall transfer level: Needs assistance Equipment used: Rolling walker (2 wheeled) Transfers: Sit to/from Stand, Google  Transfers Sit to Stand: Max assist, From elevated surface Squat pivot transfers: Max assist General transfer comment: pt performs squat pivot transfer to drop arm recliner with PT assist and knee block. Pt performs sit to stand with use of RW from recliner, PT blocking R foot as it has a tendency to slip forward due to poor knee flexion     Ambulation / Gait / Stairs / Wheelchair Mobility         Posture / Balance Dynamic Sitting Balance Sitting balance - Comments: minG-close supervision for 5 minutes Balance Overall balance assessment: Needs assistance Sitting-balance support: Bilateral upper extremity supported, Feet supported Sitting balance-Leahy Scale: Fair Sitting balance - Comments: minG-close supervision for 5 minutes Standing balance support: Bilateral upper extremity supported, During functional activity Standing balance-Leahy Scale: Zero Standing balance comment: maxA with BUE support of RW     Special needs/care consideration BiPAP/CPAP  CPM  Continuous Drip IV  Dialysis         Life Vest  Oxygen  Special Bed  Trach Size  Wound Vac  Skin L AKA surgical site; MASD buttocks stage 2; rash to abdomen and bilateral arms  ; surgical site to RLE Bowel mgmt: LBM 12/30/2019 incontinent Bladder mgmt: external catheter Diabetic mgmt:  Behavioral consideration  Chemo/radiation  Designated visitor is Event organiser, Psychiatrist CIWA on acute for daily alcohol consumption pta    Previous Home  Environment  Living Arrangements: (girlfriend, Rozella and "stepdtr", Event organiser and her boyfriend)  Lives With: Significant other, Other (Comment) Available Help at Discharge: Mamie Laurel, grand dtr with pt and his GF when Graylon Good is working) Type of Home: [employer] Layout: One level Home Access: Stairs to enter Technical brewer of Steps: 2(family to build a ramp) Bathroom Shower/Tub: Multimedia programmer: Standard Bathroom Accessibility: Yes How Accessible: Accessible via walker [place]: No Additional Comments: patient sponge bathes   Discharge Living Setting Plans for Discharge Living Setting: Patient's home, Lives with (comment)(girlfriend, Rozella and her daughter, Graylon Good and Shanta's bo) Type of Home at Discharge: House Discharge Home Layout: One level Discharge Home Access: Stairs to enter Entrance Stairs-Rails: None Entrance Stairs-Number of Steps: 2; family plans to build ramp Discharge Bathroom Shower/Tub: Walk-in shower(but pt sponge bathes) Discharge Bathroom Toilet: Standard Discharge Bathroom Accessibility: Yes How Accessible: Accessible via walker Does the patient have any problems obtaining your medications?: No   Social/Family/Support Systems Patient Roles: Parent, Associate Professor Information: Event organiser, "stepdaughter" Anticipated Caregiver: Graylon Good and Lasha Anticipated Caregiver's Contact Information: see above Ability/Limitations of Caregiver: Graylon Good works, Music therapist is there when Graylon Good is working to care for [name] and patient Caregiver Availability: 24/7 Discharge Plan Discussed with Primary Caregiver: Yes Is Caregiver In Agreement with Plan?: Yes Does Caregiver/Family have Issues with Lodging/Transportation while Pt is in Rehab?: No   Goals/Additional Needs Patient/Family Goal for Rehab: Mod I to superivsion with PT and OT Expected length of stay: ELOS 10 to 14 days Special Service Needs: Rozella, his girlfriend has history of lung cancer  and family care for them both Pt/Family Agrees to Admission and willing to participate: Yes Program Orientation Provided & Reviewed with Pt/Caregiver Including Roles  & Responsibilities: Yes   Decrease burden of Care through IP rehab admission:    Possible need for SNF placement upon discharge:    Patient Condition: I have reviewed medical records from [facility], spoken with  patient and family member. I met with patient at the bedside for inpatient rehabilitation assessment.  Patient will benefit from ongoing PT and  OT, can actively participate in 3 hours of therapy a day 5 days of the week, and can make measurable gains during the admission.  Patient will also benefit from the coordinated team approach during an Inpatient Acute Rehabilitation admission.  The patient will receive intensive therapy as well as Rehabilitation physician, nursing, social worker, and care management interventions.  Due to bladder management, bowel management, safety, skin/wound care, disease management, medication administration, pain management and patient education the patient requires 24 hour a day rehabilitation nursing.  The patient is currently Max A for transfers no ambulation yet and Max A to Total A for basic ADLs.  Discharge setting and therapy post discharge at home with home health is anticipated.  Patient has agreed to participate in the Acute Inpatient Rehabilitation Program and will admit 12/31/2019.   Preadmission Screen Completed By: Completed by Danne Baxter, RN MSN with updates by Raechel Ache, 12/31/2019 1:46 PM ______________________________________________________________________   Discussed status with Dr. Dagoberto Ligas on 12/31/2019 at 1:42PM and received approval for admission today.   Admission Coordinator: Danne Baxter RN MSN Raechel Ache, OT, time 1:42PM/Date 12/31/2019    Assessment/Plan: Diagnosis: 1. Does the need for close, 24 hr/day Medical supervision in concert with the  patient's rehab needs make it unreasonable for this patient to be served in a less intensive setting? Yes 2. Co-Morbidities requiring supervision/potential complications: tobacco and alcohol abuse, severe malnutrition, HTN, PVD, L AKA, poor dentition, poor appetite, Stage II L buttock pressure ulcer 3. Due to bladder management, bowel management, safety, skin/wound care, disease management, medication administration, pain management and patient education, does the patient require 24 hr/day rehab nursing? Yes 4. Does the patient require coordinated care of a physician, rehab nurse, PT, OT, and SLP to address physical and functional deficits in the context of the above medical diagnosis(es)? Yes Addressing deficits in the following areas: balance, endurance, strength, transferring, bathing, dressing, feeding, grooming and toileting 5. Can the patient actively participate in an intensive therapy program of at least 3 hrs of therapy 5 days a week? Yes 6. The potential for patient to make measurable gains while on inpatient rehab is fair 7. Anticipated functional outcomes upon discharge from inpatient rehab: modified independent and supervision PT, modified independent and supervision OT, n/a SLP 8. Estimated rehab length of stay to reach the above functional goals is: 10-14 days 9. Anticipated discharge destination: Home 10. Overall Rehab/Functional Prognosis: fair     MD Signature:          Revision History Date/Time User Provider Type Action  12/31/2019  1:51 PM Courtney Heys, MD Physician Sign  12/31/2019  1:47 PM Raechel Ache, [facility]hab Admission Coordinator Share  12/30/2019  2:35 PM Cristina Gong, RN Rehab Admission Coordinator Share  12/30/2019  1:31 PM Cristina Gong, RN Rehab Admission Coordinator Share  12/29/2019 12:36 PM Cristina Gong, RN Rehab Admission Coordinator Share   View Details Report

## 2020-01-01 NOTE — Plan of Care (Signed)
  Problem: Consults Goal: Skin Care Protocol Initiated - if Braden Score 18 or less Description: If consults are not indicated, leave blank or document N/A Outcome: Progressing Goal: Nutrition Consult-if indicated Outcome: Progressing   Problem: RH BOWEL ELIMINATION Goal: RH STG MANAGE BOWEL WITH ASSISTANCE Description: STG Manage Bowel with [place]. Outcome: Progressing   Problem: RH SKIN INTEGRITY Goal: RH STG SKIN FREE OF INFECTION/BREAKDOWN Outcome: Progressing Goal: RH STG MAINTAIN SKIN INTEGRITY WITH ASSISTANCE Description: STG Maintain Skin Integrity With  Mod Assistance. Outcome: Progressing Goal: RH STG ABLE TO PERFORM INCISION/WOUND CARE W/ASSISTANCE Description: STG Able To Perform Incision/Wound Care With  Mod Assistance. Outcome: Progressing

## 2020-01-01 NOTE — Plan of Care (Signed)
  Problem: RH Bathing Goal: LTG Patient will bathe all body parts with assist levels (OT) Description: LTG: Patient will bathe all body parts with assist levels (OT) Flowsheets (Taken 01/01/2020 1603) LTG: Pt will perform bathing with assistance level/cueing: Minimal Assistance - Patient > 75%   Problem: RH Dressing Goal: LTG Patient will perform upper body dressing (OT) Description: LTG Patient will perform upper body dressing with assist, with/without cues (OT). Flowsheets (Taken 01/01/2020 1603) LTG: Pt will perform upper body dressing with assistance level of: Set up assist Goal: LTG Patient will perform lower body dressing w/assist (OT) Description: LTG: Patient will perform lower body dressing with assist, with/without cues in positioning using equipment (OT) Flowsheets (Taken 01/01/2020 1603) LTG: Pt will perform lower body dressing with assistance level of: Minimal Assistance - Patient > 75%   Problem: RH Toileting Goal: LTG Patient will perform toileting task (3/3 steps) with assistance level (OT) Description: LTG: Patient will perform toileting task (3/3 steps) with assistance level (OT)  Flowsheets (Taken 01/01/2020 1603) LTG: Pt will perform toileting task (3/3 steps) with assistance level: Minimal Assistance - Patient > 75%   Problem: RH Toilet Transfers Goal: LTG Patient will perform toilet transfers w/assist (OT) Description: LTG: Patient will perform toilet transfers with assist, with/without cues using equipment (OT) Flowsheets (Taken 01/01/2020 1603) LTG: Pt will perform toilet transfers with assistance level of: Contact Guard/Touching assist   Problem: RH Pre-functional/Other (Specify) Goal: RH LTG OT (Specify) 1 Description: Family will exhibit carryover of OT safety education to provide pts needed ADL assistance at discharge  Flowsheets (Taken 01/01/2020 1603) LTG: Other OT (Specify) 1: Family will exhibit carryover of OT safety education to provide pts needed ADL assistance  at home

## 2020-01-02 ENCOUNTER — Inpatient Hospital Stay (HOSPITAL_COMMUNITY): Payer: Medicaid Other | Admitting: Occupational Therapy

## 2020-01-02 ENCOUNTER — Inpatient Hospital Stay (HOSPITAL_COMMUNITY): Payer: Medicaid Other | Admitting: Physical Therapy

## 2020-01-02 LAB — BPAM RBC
Blood Product Expiration Date: 202101262359
Blood Product Expiration Date: 202101262359
Blood Product Expiration Date: 202101282359
Blood Product Expiration Date: 202101282359
ISSUE DATE / TIME: 202012290956
ISSUE DATE / TIME: 202012291357
Unit Type and Rh: 5100
Unit Type and Rh: 5100
Unit Type and Rh: 5100
Unit Type and Rh: 5100

## 2020-01-02 LAB — TYPE AND SCREEN
ABO/RH(D): O POS
Antibody Screen: NEGATIVE
Unit division: 0
Unit division: 0
Unit division: 0
Unit division: 0

## 2020-01-02 MED ORDER — DOCUSATE SODIUM 100 MG PO CAPS: 100.0000 mg | ORAL_CAPSULE | Freq: Every day | ORAL | Status: DC | PRN

## 2020-01-02 MED ORDER — POLYETHYLENE GLYCOL 3350 17 G PO PACK: 17.0000 g | PACK | Freq: Every day | ORAL | Status: DC | PRN

## 2020-01-02 NOTE — Consult Note (Signed)
[facility] Nurse wound consult note Consultation was completed by review of records  and assistance from the bedside nurse/clinical staff.    Reason for Consult: clarification of wound care orders for "backside" Contacted bedside nurse; she accurately described area documented   Wound type: Stage 2 Pressure Injury; partial thickness along with Moisture Associated Skin Damage from incontinence Pressure Injury POA  Yes Measurement: see nursing flowsheet Wound bed:per staff clean;pink Drainage (amount, consistency, odor) none Periwound: intact Dressing procedure/placement/frequency: Staff to continue to follow skin care order set for Stage 2 PI and MASD  Discussed POC with patient and bedside nurse.  Re consult if needed, will not follow at this time. Thanks  Stancil Deisher [employer], RN,CWOCN, CNS, [place] [phone])

## 2020-01-02 NOTE — Progress Notes (Signed)
Physical Therapy Session Note  Patient Details  Name: Terrence Carr MRN: [id number] Date of Birth: Dec 02, 1961  Today's Date: 01/02/2020 PT Individual Time: 1108-1202 PT Individual Time Calculation (min): 54 min   Short Term Goals: Week 1:  PT Short Term Goal 1 (Week 1): STG=LTG due to ELOS  Skilled Therapeutic Interventions/Progress Updates:    Pt received supine in bed reporting he had a good day yesterday riding around in his "cadillac" (referring to his w/c) and "going to the gym." Despite this, pt requires encouragement to participate in therapy session at this time reporting he wanted to eat prior; however, therapist explained he had an hour until lunch - pt agreeable to participate but required increased time to initiate getting OOB. During this time therapist discussed D/C planning with pt reporting his girlfriend, Kalman Shan, and step-daughter would be able to provide 24hr support and that he lives in a 1story home with a ramped entrance. Supine/long sitting in bed pt donned pants with min assist for threading R LE and increased time - able to perform rolling and lateral weight shifts to pull pants over hips. Supine>sitting EOB with supervision. L lateral scoot transfer EOB>w/c using transfer board with pt demonstrating unsafe use of board; however, not receptive to therapist's education to correct the positioning and pt does not like for therapist to try and assist him with the transfer - performed transfer with CGA for safety. Pt continuously requesting coffee during session therefore dependent transport in w/c to nurses station but pt did not want the type of coffee available. Performed B UE w/c propulsion ~47ft to ortho gym with supervision. With min cuing pt able to lock and unlock w/c brakes during session. Performed B UE strengthening and overall endurance training using UBE against level 2 resistance for 5 minutes totaling .25mi - intermittently alternating between forwards/backwards. Pt not  agreeable to transfer to mat table for LE exercises. Performed sitting balance task in w/c using dynavision to reach outside BOS and touch targets for 2 minutes with average reaction time of 1.94seconds. W/c propulsion ~38ft using B UE support with supervision over towards simulated care; however, pt still not agreeable to attempt car transfer at this time despite encouragement. Therapist educated pt on use of transfer board to perform car transfer. Performed w/c propulsion ~79ft up/down ramp with min/mod assist for getting over threshold to start up ramp but otherwise close supervision for safety. Pt reports wanting to stand but not agreeable to attempt at this time despite encouragement. Pt requesting to return to room at this time. B UE w/c propulsion ~176ft back to room with supervision. Therapist educated pt on ELOS with need for family education during his stay as well as education on importance of wearing his shrinker once it is available, pain management strategies with desensitization, and generalized overview regarding process for prosthetic assessment. Pt left seated in w/c with needs in reach and chair alarm on.  Therapy Documentation Precautions:  Precautions Precautions: Fall Precaution Comments: Lt AKA, wounds R LE and Lt buttocks Restrictions Weight Bearing Restrictions: Yes RLE Weight Bearing: Weight bearing as tolerated LLE Weight Bearing: Non weight bearing  Pain:   Denies pain during session.   Therapy/Group: Individual Therapy  Tawana Scale, PT, DPT 01/02/2020, 7:50 AM

## 2020-01-02 NOTE — Plan of Care (Signed)
  Problem: Consults Goal: Skin Care Protocol Initiated - if Braden Score 18 or less Description: If consults are not indicated, leave blank or document N/A Outcome: Progressing Goal: Nutrition Consult-if indicated Outcome: Progressing   Problem: RH BOWEL ELIMINATION Goal: RH STG MANAGE BOWEL WITH ASSISTANCE Description: STG Manage Bowel with [place]. Outcome: Progressing   Problem: RH SKIN INTEGRITY Goal: RH STG SKIN FREE OF INFECTION/BREAKDOWN Outcome: Progressing Goal: RH STG MAINTAIN SKIN INTEGRITY WITH ASSISTANCE Description: STG Maintain Skin Integrity With  Mod Assistance. Outcome: Progressing Goal: RH STG ABLE TO PERFORM INCISION/WOUND CARE W/ASSISTANCE Description: STG Able To Perform Incision/Wound Care With  Mod Assistance. Outcome: Progressing   Problem: Consults Goal: RH LIMB LOSS PATIENT EDUCATION Description: Description: See Patient Education module for eduction specifics. Outcome: Progressing

## 2020-01-02 NOTE — Progress Notes (Signed)
Occupational Therapy Session Note  Patient Details  Name: Terrence Carr MRN: [id number] Date of Birth: Nov 04, 1961  Today's Date: 01/02/2020 OT Individual Time: [id number] OT Individual Time Calculation (min): 25 min  and Today's Date: 01/02/2020 OT Missed Time: 35 Minutes Missed Time Reason: Patient ill (comment)   Short Term Goals: Week 1:  OT Short Term Goal 1 (Week 1): STGs=LTGs due to ELOS  Skilled Therapeutic Interventions/Progress Updates:    Upon entering the room, pt supine in bed with lights off in room. Pt verbalized, "I don't feel good. I just keep shitting on myself." OT offered to assist pt with toileting and check clothing for incontinence but he declined. OT notified RN of pt requesting medication for frequent diarrhea and nausea. OT provided pt with crackers and liquids per his request to settle stomach. Pt needing min A to reposition in bed. OT encouraging pt to participate but he continues to decline secondary to feeling unwell and wanting to take a "nap". Bed alarm activated and call bell within reach upon exiting the room. 35 missed minutes of skilled OT intervention.    Therapy Documentation Precautions:  Precautions Precautions: Fall Precaution Comments: Lt AKA, wounds R LE and Lt buttocks Restrictions Weight Bearing Restrictions: Yes RLE Weight Bearing: Weight bearing as tolerated LLE Weight Bearing: Non weight bearing General: General OT Amount of Missed Time: 35 Minutes ADL: ADL Grooming: Setup Where Assessed-Grooming: Sitting at sink Upper Body Bathing: Supervision/safety Where Assessed-Upper Body Bathing: Standing at sink Lower Body Bathing: (Unable to assess due to pt refusal) Upper Body Dressing: Moderate assistance Where Assessed-Upper Body Dressing: Sitting at sink Lower Body Dressing: Supervision/safety Where Assessed-Lower Body Dressing: Sitting at sink Toileting: Not assessed(due to pt refusal) Toilet Transfer: Not assessed(due to pt  refusal) Tub/Shower Transfer: Not assessed ADL Comments: Pt with very limited participation due to agitated behavior   Therapy/Group: Individual Therapy  [name] 01/02/2020, 9:11 AM

## 2020-01-02 NOTE — Progress Notes (Signed)
Occupational Therapy Session Note  Patient Details  Name: Terrence Carr MRN: [id number] Date of Birth: April 20, 1961  Today's Date: 01/02/2020 OT Individual Time: 1500-1526 OT Individual Time Calculation (min): 26 min  30 missed minutes secondary to pt refusal  Short Term Goals: Week 1:  OT Short Term Goal 1 (Week 1): STGs=LTGs due to ELOS  Skilled Therapeutic Interventions/Progress Updates:    Pt greeting therapist in hallway in wheelchair. Pt with no c/o pain this session. Pt reports, " Where is that arm bike thing? I want to do that." Pt propelled wheelchair 125' with supervision and increased time to gym. Pt engaged in B UE ergometer for 10 minutes while therapist provided education and motivated for OT participation. Pt requesting coffee and OT assisted him with making coffee. OT carried coffee to room secondary to concern of pt burning himself. Pt fixing coffee how he would like to drink it and asking, " You think I can go be with my family tomorrow?" OT educating pt about CIR program and COVID restrictions at this time that do not allow for passes off campus for safety. Pt declined further OT intervention at this time but was agreeable to remaining in wheelchair. Chair alarm belt donned and activated with call bell within reach.   Therapy Documentation Precautions:  Precautions Precautions: Fall Precaution Comments: Lt AKA, wounds R LE and Lt buttocks Restrictions Weight Bearing Restrictions: Yes RLE Weight Bearing: Weight bearing as tolerated LLE Weight Bearing: Non weight bearing General: General OT Amount of Missed Time: 30 Minutes ADL: ADL Grooming: Setup Where Assessed-Grooming: Sitting at sink Upper Body Bathing: Supervision/safety Where Assessed-Upper Body Bathing: Standing at sink Lower Body Bathing: (Unable to assess due to pt refusal) Upper Body Dressing: Moderate assistance Where Assessed-Upper Body Dressing: Sitting at sink Lower Body Dressing:  Supervision/safety Where Assessed-Lower Body Dressing: Sitting at sink Toileting: Not assessed(due to pt refusal) Toilet Transfer: Not assessed(due to pt refusal) Tub/Shower Transfer: Not assessed ADL Comments: Pt with very limited participation due to agitated behavior   Therapy/Group: Individual Therapy  [name] 01/02/2020, 3:33 PM

## 2020-01-02 NOTE — Progress Notes (Signed)
[place] PHYSICAL MEDICINE & REHABILITATION PROGRESS NOTE   Subjective/Complaints:  Pt reports bowels are really loose and frequent- asking for some "relief".  Ate ~1/2 breakfast.   ROS- pt denies SOB, CP, Abd pain, N/V/D, constipation, visual changes,  Objective:   No results found. Recent Labs    12/31/19 0311 01/01/20 0519  WBC 9.0 6.9  HGB 8.4* 8.1*  HCT 24.3* 24.5*  PLT 629* 699*   Recent Labs    12/31/19 0311 01/01/20 0519  NA 135 135  K 4.2 4.1  CL 102 101  CO2 25 26  GLUCOSE 112* 105*  BUN 10 11  CREATININE 0.75 0.85  CALCIUM 8.5* 8.2*    Intake/Output Summary (Last 24 hours) at 01/02/2020 1050 Last data filed at 01/02/2020 0740 Gross per 24 hour  Intake 600 ml  Output 750 ml  Net -150 ml     Physical Exam: Vital Signs Blood pressure 136/62, pulse 63, temperature 98.2 F (36.8 C), resp. rate 19, height 5\' 5"  (1.651 m), weight 60.3 kg, SpO2 100 %.  .  Physical Exam  Nursing note and vitals reviewed.  Constitutional: No distress.  Cachetic, lying supine in bed; OT in room; about to get up, NAD HENT:  Head: Normocephalic and atraumatic.  Mouth/Throat: No oropharyngeal exudate.  Patient with poor dentition -  Eyes: EOMs intact Neck: No tracheal deviation present.  Cardiovascular:  RRR  Respiratory:  CTA B/L, no W/R/R  GI:  Soft, NT, ND, (+) BS Musculoskeletal:  Cervical back: Normal range of motion and neck supple.  Comments: RUE 4+/5- deltoid, biceps, triceps, WE, grip, finger abd  LUE- deltoid 2+/5, otherwise 4+/5 in LUE RLE- at least 3+/5- wouldn't push secondary to "wounds" LLE- HF 3+/5- L AKA L shoulder painful per pt- but nothing specific on palpation- "has been out of socket before".  Neurological:. AO x 2- doesn't know date/day- is a little confused Sensation intact on L AKA Sensation to LT decreased below knee on RLE  Skin: No rash noted. He is not diaphoretic. No erythema.  Left AKA with staples in place no drainage, no  erythema- looks great- (+) dog ears. Bypass sites lower extremities dressed with palpable pulses R shin wound and R top of foot have dressing- wounds bleeding through- Buttocks- on R buttock- multiple areas slightly open- like previous pimples or moisture? Sacrum slightly open- appears due to moisture- <1cm x 1cm; L buttock has pressure ulcer - superficially open- pink, with a little slough in middle- ~ 5-7 cm Psychiatric:  Frustrated about coffee; slightly confused    Assessment/Plan: 1. Functional deficits secondary to L AKA and fempop bypass which require 3+ hours per day of interdisciplinary therapy in a comprehensive inpatient rehab setting.  Physiatrist is providing close team supervision and 24 hour management of active medical problems listed below.  Physiatrist and rehab team continue to assess barriers to discharge/monitor patient progress toward functional and medical goals  Care Tool:  Bathing    Body parts bathed by patient: Right arm, Left arm, Chest, Abdomen, Face     Body parts n/a: Front perineal area, Buttocks, Right upper leg, Left upper leg, Right lower leg, Left lower leg   Bathing assist Assist Level: Supervision/Verbal cueing     Upper Body Dressing/Undressing Upper body dressing   What is the patient wearing?: Hospital gown only    Upper body assist Assist Level: Moderate Assistance - Patient 50 - 74%    Lower Body Dressing/Undressing Lower body dressing    Lower  body dressing activity did not occur: Refused What is the patient wearing?: Pants     Lower body assist Assist for lower body dressing: Moderate Assistance - Patient 50 - 74%     Toileting Toileting Toileting Activity did not occur Landscape architect and hygiene only): Refused  Toileting assist Assist for toileting: Total Assistance - Patient < 25%     Transfers Chair/bed transfer  Transfers assist     Chair/bed transfer assist level: Maximal Assistance - Patient 25 - 49%      Locomotion Ambulation   Ambulation assist   Ambulation activity did not occur: Refused          Walk 10 feet activity   Assist  Walk 10 feet activity did not occur: Safety/medical concerns        Walk 50 feet activity   Assist Walk 50 feet with 2 turns activity did not occur: Safety/medical concerns         Walk 150 feet activity   Assist Walk 150 feet activity did not occur: Safety/medical concerns         Walk 10 feet on uneven surface  activity   Assist Walk 10 feet on uneven surfaces activity did not occur: Safety/medical concerns         Wheelchair     Assist   Type of Wheelchair: Manual    Wheelchair assist level: Supervision/Verbal cueing Max wheelchair distance: 150    Wheelchair 50 feet with 2 turns activity    Assist        Assist Level: Supervision/Verbal cueing   Wheelchair 150 feet activity     Assist      Assist Level: Supervision/Verbal cueing   Blood pressure 136/62, pulse 63, temperature 98.2 F (36.8 C), resp. rate 19, height 5\' 5"  (1.651 m), weight 60.3 kg, SpO2 100 %.  Medical Problem List and Plan:  1. Decreased functional ability secondary to peripheral vascular disease status post left AKA 12/24/2019 as well as right femoral-popliteal bypass 12/30/2019  -patient may shower if L AKA is covered  -ELOS/Goals: Mod I to min assist- 10-14 days  2. Antithrombotics:  -DVT/anticoagulation: Subcutaneous heparin  -antiplatelet therapy: Aspirin 81 mg daily  3. Pain Management: Neurontin 100 mg 3 times daily, oxycodone as needed  4. Mood: Provide emotional support  -antipsychotic agents: N/A  5. Neuropsych: This patient is capable of making decisions on his own behalf.  6. Skin/Wound Care- wounds on RLE, groin from R fempop bypass and Stage II on L buttocks: Routine skin checks/sacral foam prophylactic dressing change every third day- needs dressing changes for RLE as well.  1/1- L buttock pressure ulcer,  sacral/coccyx looks like moisture- <1x1cm- few spots on R buttock- doesn't appear to be pressure- will consult WOC  7. Fluids/Electrolytes/Nutrition: Routine in and outs with follow-up chemistries   1/1- good results in BMP today 8. Acute blood loss anemia. Follow-up CBC  -last CBC showed Hb of 8.4- down from 11 after surgery- will monitor  9. Hypertension. Monitor with increased mobility  10. Hyperlipidemia. Lipitor  11. History of tobacco alcohol use- at least 1/2ppd- . Provide counseling.  12. Vitamin B12 deficiency. Continue supplement  13. Constipation. Laxative assistance  1/2- will stop Colace and make prn as well as Miralax since having so many stools- might need to increase again, but for now, will make prn.  14. Alcohol abuse- pt finally admitted since retirement was drinking "1/2 pint every day of vodka"- monitor for withdrawal- likely drinking more.  15.  Severe protein malnutrition- BMI is 20- but albumin is 1.6- which is significant and total protein is 5.7- will con't protein supplements, and if loses more weight, will consult nutrition.  16. L shoulder weakness/discomfort- will d/w OT in how it relates to function.  35. Ostemyelitis? - Pt was on Vanc- doesn't need Vanc anymore per vascular physicians since had L AKA.     LOS: 2 days A FACE TO FACE EVALUATION WAS PERFORMED  Azalynn Maxim 01/02/2020, 10:50 AM

## 2020-01-03 ENCOUNTER — Inpatient Hospital Stay (HOSPITAL_COMMUNITY): Payer: Medicaid Other | Admitting: Occupational Therapy

## 2020-01-03 NOTE — Progress Notes (Signed)
   VASCULAR SURGERY ASSESSMENT & PLAN:   POD 4 RIGHT FEM BK POP: He has brisk Doppler signals in the dorsalis pedis and posterior tibial positions on the right.  The wounds on the right foot and leg are unchanged. These can be followed as an outpatient.  VASCULAR QUALITY INITIATIVE: He is on aspirin and a statin.  DVT PROPHYLAXIS: He is on subcu heparin  Vascular surgery will be available as needed.  I have arranged follow-up with Dr. Donzetta Matters.  SUBJECTIVE:   No complaints.  PHYSICAL EXAM:   Vitals:   01/01/20 1421 01/01/20 1916 01/02/20 0303 01/03/20 0346  BP: 104/62 122/62 136/62 140/62  Pulse: 70 67 63 62  Resp: 18 19 19 16   Temp: 97.9 F (36.6 C) 98.4 F (36.9 C) 98.2 F (36.8 C) 98.6 F (37 C)  TempSrc: Oral   Oral  SpO2: 98% 100% 100% 100%  Weight:   60.3 kg   Height:       Brisk Doppler signals in the dorsalis pedis and posterior tibial positions His incisions look fine.  LABS:    PROBLEM LIST:    Principal Problem:   Left above-knee amputee [facility]) Active Problems:   Sacral decubitus ulcer   Essential hypertension   Osteomyelitis of multiple sites (HCC)   Pressure ulcer of left buttock, stage 2 (HCC)   CURRENT MEDS:   . aspirin EC  81 mg Oral Daily  . atorvastatin  20 mg Oral q1800  . feeding supplement (ENSURE ENLIVE)  237 mL Oral BID BM  . folic acid  1 mg Oral Daily  . gabapentin  100 mg Oral TID  . heparin  5,000 Units Subcutaneous Q8H  . multivitamin with minerals  1 tablet Oral Daily  . nutrition supplement (JUVEN)  1 packet Oral BID BM  . pantoprazole  40 mg Oral Daily  . thiamine  100 mg Oral Daily  . vitamin B-12  1,000 mcg Oral Daily    Deitra Mayo Office: [phone] 01/03/2020

## 2020-01-03 NOTE — Plan of Care (Signed)
  Problem: Consults Goal: Skin Care Protocol Initiated - if Braden Score 18 or less Description: If consults are not indicated, leave blank or document N/A Outcome: Progressing Goal: Nutrition Consult-if indicated Outcome: Progressing   Problem: RH BOWEL ELIMINATION Goal: RH STG MANAGE BOWEL WITH ASSISTANCE Description: STG Manage Bowel with [place]. Outcome: Progressing   Problem: RH SKIN INTEGRITY Goal: RH STG SKIN FREE OF INFECTION/BREAKDOWN Outcome: Progressing Goal: RH STG MAINTAIN SKIN INTEGRITY WITH ASSISTANCE Description: STG Maintain Skin Integrity With  Mod Assistance. Outcome: Progressing Goal: RH STG ABLE TO PERFORM INCISION/WOUND CARE W/ASSISTANCE Description: STG Able To Perform Incision/Wound Care With  Mod Assistance. Outcome: Progressing   Problem: Consults Goal: RH LIMB LOSS PATIENT EDUCATION Description: Description: See Patient Education module for eduction specifics. Outcome: Progressing

## 2020-01-03 NOTE — IPOC Note (Signed)
Overall Plan of Care [facility]) Patient Details Name: Terrence Carr MRN: [id number] DOB: 1961/02/10  Admitting Diagnosis: Left above-knee amputee [facility])  Hospital Problems: Principal Problem:   Left above-knee amputee [facility]) Active Problems:   Sacral decubitus ulcer   Essential hypertension   Osteomyelitis of multiple sites ([place])   Pressure ulcer of left buttock, stage 2 ([place])     Functional Problem List: Nursing    PT Balance, Behavior, Edema, Endurance, Motor, Perception, Pain, Safety, Sensory  OT Balance, Behavior, Cognition, Endurance, Motor, Pain, Safety, Skin Integrity  SLP    TR         Basic ADL's: OT Bathing, Dressing, Toileting     Advanced  ADL's: OT Simple Meal Preparation     Transfers: PT Bed Mobility, Bed to Chair, Furniture, Musician, Floor  OT Toilet, Metallurgist: PT Ambulation, Emergency planning/management officer, Stairs     Additional Impairments: OT None  SLP        TR      Anticipated Outcomes Item Anticipated Outcome  Self Feeding No goal  Swallowing      Basic self-care  Supervision-Min A  Toileting  Min A   Bathroom Transfers Steady assist  Bowel/Bladder     Transfers  supervision assist via SB  Locomotion  WC level with supervision assist  Communication     Cognition     Pain     Safety/Judgment      Therapy Plan: PT Intensity: Minimum of 1-2 x/day ,45 to 90 minutes PT Frequency: 5 out of 7 days PT Duration Estimated Length of Stay: 7-10days OT Intensity: Minimum of 1-2 x/day, 45 to 90 minutes OT Frequency: 5 out of 7 days OT Duration/Estimated Length of Stay: 7-10 days     Due to the current state of emergency, patients may not be receiving their 3-hours of Medicare-mandated therapy.   Team Interventions: Nursing Interventions    PT interventions Ambulation/gait training, Discharge planning, Functional mobility training, Psychosocial support, Therapeutic Activities, Visual/perceptual remediation/compensation,  Wheelchair propulsion/positioning, Therapeutic Exercise, Skin care/wound management, Neuromuscular re-education, Disease management/prevention, Training and development officer, Cognitive remediation/compensation, DME/adaptive equipment instruction, Splinting/orthotics, UE/LE Strength taining/ROM, Pain management, Community reintegration, Functional electrical stimulation, Stair training, Barrister's clerk education, UE/LE Coordination activities  OT Interventions Training and development officer, Engineer, drilling, Patient/family education, Therapeutic Activities, Wheelchair propulsion/positioning, Cognitive remediation/compensation, Psychosocial support, Therapeutic Exercise, UE/LE Strength taining/ROM, Self Care/advanced ADL retraining, Functional mobility training, Discharge planning, Neuromuscular re-education, Community reintegration, Skin care/wound managment, UE/LE Coordination activities, Disease mangement/prevention, Pain management  SLP Interventions    TR Interventions    SW/CM Interventions Discharge Planning, Psychosocial Support, Patient/Family Education   Barriers to Discharge MD  Medical stability, Home enviroment access/loayout, Incontinence, Wound care, Lack of/limited family support, Weight and Weight bearing restrictions  Nursing      PT Inaccessible home environment, Decreased caregiver support, Medical stability, Wound Care, Lack of/limited family support, Insurance for SNF coverage, Weight bearing restrictions, Medication compliance, Behavior poor safety awareness, poor self care and medication management  OT Behavior, Wound Care    SLP      SW       Team Discharge Planning: Destination: PT-Home ,OT- Home , SLP-  Projected Follow-up: PT-Home health PT, OT-  Home health OT, SLP-  Projected Equipment Needs: PT-Wheelchair (measurements), Wheelchair cushion (measurements), OT- To be determined, SLP-  Equipment Details: PT- , OT-  Patient/family involved in discharge  planning: PT- Patient,  OT-Patient, SLP-   MD ELOS: 7-10 days Medical Rehab Prognosis:  Good Assessment:  Pt  is a 59 yr old male with PVD/osteomyelitis  s/p R fem pop bypass and L AKA, ABLA, alcohol and tobacco abuse, severe protein malnutrition, and wounds on backside and RLE- consulted WOC for L buttock.  See Team Conference Notes for weekly updates to the plan of care

## 2020-01-03 NOTE — Progress Notes (Signed)
[place] PHYSICAL MEDICINE & REHABILITATION PROGRESS NOTE   Subjective/Complaints:  Pt reports tired due to therapy yesterday- ready to do more today, though!  Room is cold, per pt.   ROS- pt denies SOB, CP, Abd pain, N/V/D, constipation, visual changes,  Objective:   No results found. Recent Labs    01/01/20 0519  WBC 6.9  HGB 8.1*  HCT 24.5*  PLT 699*   Recent Labs    01/01/20 0519  NA 135  K 4.1  CL 101  CO2 26  GLUCOSE 105*  BUN 11  CREATININE 0.85  CALCIUM 8.2*    Intake/Output Summary (Last 24 hours) at 01/03/2020 1027 Last data filed at 01/03/2020 0700 Gross per 24 hour  Intake 1200 ml  Output 1200 ml  Net 0 ml     Physical Exam: Vital Signs Blood pressure 140/62, pulse 62, temperature 98.6 F (37 C), temperature source Oral, resp. rate 16, height 5\' 5"  (1.651 m), weight 60.3 kg, SpO2 100 %.  .  Physical Exam  Nursing note and vitals reviewed.  Constitutional: No distress.  Cachetic but looks better overall, better color/fuller face; sitting up in manual w/c at window, NAD HENT:  Head: Normocephalic and atraumatic.  Mouth/Throat: No oropharyngeal exudate.  Patient with poor dentition -  Eyes: EOMs intact Neck: No tracheal deviation present.  Cardiovascular:  RRR  Respiratory:  CTA B/L, no W/R/R  GI:  Soft, NT, ND, (+) BS Musculoskeletal:  Cervical back: Normal range of motion and neck supple.  Comments: RUE 4+/5- deltoid, biceps, triceps, WE, grip, finger abd  LUE- deltoid 2+/5, otherwise 4+/5 in LUE RLE- at least 3+/5- wouldn't push secondary to "wounds" LLE- HF 3+/5- L AKA L shoulder painful per pt- but nothing specific on palpation- "has been out of socket before".  Neurological:. AO x 2- doesn't know date/day- is a little confused Sensation intact on L AKA Sensation to LT decreased below knee on RLE  Skin: No rash noted. He is not diaphoretic. No erythema.  Left AKA with staples in place no drainage, no erythema- looks great- (+)  dog ears. Bypass sites lower extremities dressed with palpable pulses R shin wound and R top of foot have dressing- wounds bleeding through- Buttocks- on R buttock- multiple areas slightly open- like previous pimples or moisture? Sacrum slightly open- appears due to moisture- <1cm x 1cm; L buttock has pressure ulcer - superficially open- pink, with a little slough in middle- ~ 5-7 cm Psychiatric:  More aware today   Assessment/Plan: 1. Functional deficits secondary to L AKA and fempop bypass which require 3+ hours per day of interdisciplinary therapy in a comprehensive inpatient rehab setting.  Physiatrist is providing close team supervision and 24 hour management of active medical problems listed below.  Physiatrist and rehab team continue to assess barriers to discharge/monitor patient progress toward functional and medical goals  Care Tool:  Bathing    Body parts bathed by patient: Right arm, Left arm, Chest, Abdomen, Face     Body parts n/a: Front perineal area, Buttocks, Right upper leg, Left upper leg, Right lower leg, Left lower leg   Bathing assist Assist Level: Supervision/Verbal cueing     Upper Body Dressing/Undressing Upper body dressing   What is the patient wearing?: Hospital gown only    Upper body assist Assist Level: Moderate Assistance - Patient 50 - 74%    Lower Body Dressing/Undressing Lower body dressing    Lower body dressing activity did not occur: Refused What is the  patient wearing?: Pants     Lower body assist Assist for lower body dressing: Moderate Assistance - Patient 50 - 74%     Toileting Toileting Toileting Activity did not occur Landscape architect and hygiene only): Refused  Toileting assist Assist for toileting: Maximal Assistance - Patient 25 - 49%     Transfers Chair/bed transfer  Transfers assist     Chair/bed transfer assist level: Moderate Assistance - Patient 50 - 74%     Locomotion Ambulation   Ambulation  assist   Ambulation activity did not occur: Refused          Walk 10 feet activity   Assist  Walk 10 feet activity did not occur: Safety/medical concerns        Walk 50 feet activity   Assist Walk 50 feet with 2 turns activity did not occur: Safety/medical concerns         Walk 150 feet activity   Assist Walk 150 feet activity did not occur: Safety/medical concerns         Walk 10 feet on uneven surface  activity   Assist Walk 10 feet on uneven surfaces activity did not occur: Safety/medical concerns         Wheelchair     Assist Will patient use wheelchair at discharge?: Yes Type of Wheelchair: Manual    Wheelchair assist level: Supervision/Verbal cueing Max wheelchair distance: 125'    Wheelchair 50 feet with 2 turns activity    Assist        Assist Level: Supervision/Verbal cueing   Wheelchair 150 feet activity     Assist      Assist Level: Supervision/Verbal cueing   Blood pressure 140/62, pulse 62, temperature 98.6 F (37 C), temperature source Oral, resp. rate 16, height 5\' 5"  (1.651 m), weight 60.3 kg, SpO2 100 %.  Medical Problem List and Plan:  1. Decreased functional ability secondary to peripheral vascular disease status post left AKA 12/24/2019 as well as right femoral-popliteal bypass 12/30/2019  -patient may shower if L AKA is covered  -ELOS/Goals: Mod I to min assist- 10-14 days  2. Antithrombotics:  -DVT/anticoagulation: Subcutaneous heparin  -antiplatelet therapy: Aspirin 81 mg daily  3. Pain Management: Neurontin 100 mg 3 times daily, oxycodone as needed  4. Mood: Provide emotional support  -antipsychotic agents: N/A  5. Neuropsych: This patient is capable of making decisions on his own behalf.  6. Skin/Wound Care- wounds on RLE, groin from R fempop bypass and Stage II on L buttocks: Routine skin checks/sacral foam prophylactic dressing change every third day- needs dressing changes for RLE as well.  1/1-  L buttock pressure ulcer, sacral/coccyx looks like moisture- <1x1cm- few spots on R buttock- doesn't appear to be pressure- will consult WOC  7. Fluids/Electrolytes/Nutrition: Routine in and outs with follow-up chemistries   1/1- good results in BMP today 8. Acute blood loss anemia. Follow-up CBC  -last CBC showed Hb of 8.4- down from 11 after surgery- will monitor  9. Hypertension. Monitor with increased mobility  10. Hyperlipidemia. Lipitor  11. History of tobacco alcohol use- at least 1/2ppd- . Provide counseling.  12. Vitamin B12 deficiency. Continue supplement  13. Constipation. Laxative assistance  1/2- will stop Colace and make prn as well as Miralax since having so many stools- might need to increase again, but for now, will make prn.  14. Alcohol abuse- pt finally admitted since retirement was drinking "1/2 pint every day of vodka"- monitor for withdrawal- likely drinking more.  15. Severe  protein malnutrition- BMI is 20- but albumin is 1.6- which is significant and total protein is 5.7- will con't protein supplements, and if loses more weight, will consult nutrition.  16. L shoulder weakness/discomfort- will d/w OT in how it relates to function.  18. Ostemyelitis? - Pt was on Vanc- doesn't need Vanc anymore per vascular physicians since had L AKA.     LOS: 3 days A FACE TO FACE EVALUATION WAS PERFORMED  Thom Ollinger 01/03/2020, 10:27 AM

## 2020-01-03 NOTE — Progress Notes (Signed)
Occupational Therapy Session Note  Patient Details  Name: Terrence Carr MRN: [id number] Date of Birth: January 21, 1961  Today's Date: 01/03/2020 OT Individual Time: [id number] OT Individual Time Calculation (min): 60 min    Short Term Goals: Week 1:  OT Short Term Goal 1 (Week 1): STGs=LTGs due to ELOS  Skilled Therapeutic Interventions/Progress Updates:    Upon entering the room, pt seated in wheelchair with no c/o pain and agreeable to OT intervention. Pt declined bathing, dressing, and toileting tasks this session. Pt propelled wheelchair to gym and utilized B UE ergometer for 8 minutes while therapist discussed home set up and equipment needs. Pt verbalized living with several family member who will be present to provide assist as needed. Pt will likely need drop arm commode chair and unsure of shower equipment at this time as pt has yet to engage in this task with therapy. Pt requesting coffee and propelled wheelchair to family room to make coffee himself with cuing on using device. OT discussed how he could carry his coffee at home from wheelchair level. He insisted he could carry now safely and basically held cup in one hand and pushed chair with the other while alternating and switching cup back and forth with each push. OT provided pt with education on other ways to perform task at home. Pt engaged in 3 sets of 20 zoom ball with use of B UEs for endurance and strengthening. OT provided pt with level 2 resistive theraband for B UE strengthening. Pt required increased cuing and min A for proper technique and rest breaks needed secondary to fatigue. Pt returning to room and remaining in wheelchair with chair alarm belt donned.   Therapy Documentation Precautions:  Precautions Precautions: Fall Precaution Comments: Lt AKA, wounds R LE and Lt buttocks Restrictions Weight Bearing Restrictions: No RLE Weight Bearing: Weight bearing as tolerated LLE Weight Bearing: Non weight bearing General:    Vital Signs: Therapy Vitals Temp: 98.1 F (36.7 C) Pulse Rate: 64 Resp: 16 BP: (!) 125/51 Patient Position (if appropriate): Sitting Oxygen Therapy SpO2: 100 % O2 Device: Room Air Pain: Pain Assessment Pain Scale: 0-10 Pain Score: 0-No pain Faces Pain Scale: No hurt ADL: ADL Grooming: Setup Where Assessed-Grooming: Sitting at sink Upper Body Bathing: Supervision/safety Where Assessed-Upper Body Bathing: Standing at sink Lower Body Bathing: (Unable to assess due to pt refusal) Upper Body Dressing: Moderate assistance Where Assessed-Upper Body Dressing: Sitting at sink Lower Body Dressing: Supervision/safety Where Assessed-Lower Body Dressing: Sitting at sink Toileting: Not assessed(due to pt refusal) Toilet Transfer: Not assessed(due to pt refusal) Tub/Shower Transfer: Not assessed ADL Comments: Pt with very limited participation due to agitated behavior   Therapy/Group: Individual Therapy  [name] 01/03/2020, 2:09 PM

## 2020-01-04 ENCOUNTER — Inpatient Hospital Stay (HOSPITAL_COMMUNITY): Payer: Medicaid Other

## 2020-01-04 ENCOUNTER — Inpatient Hospital Stay (HOSPITAL_COMMUNITY): Payer: Medicaid Other | Admitting: Occupational Therapy

## 2020-01-04 LAB — CBC WITH DIFFERENTIAL/PLATELET
Abs Immature Granulocytes: 0.04 10*3/uL (ref 0.00–0.07)
Basophils Absolute: 0.1 10*3/uL (ref 0.0–0.1)
Basophils Relative: 1 %
Eosinophils Absolute: 0.2 10*3/uL (ref 0.0–0.5)
Eosinophils Relative: 2 %
HCT: 24.6 % — ABNORMAL LOW (ref 39.0–52.0)
Hemoglobin: 8.1 g/dL — ABNORMAL LOW (ref 13.0–17.0)
Immature Granulocytes: 1 %
Lymphocytes Relative: 19 %
Lymphs Abs: 1.5 10*3/uL (ref 0.7–4.0)
MCH: 31.6 pg (ref 26.0–34.0)
MCHC: 32.9 g/dL (ref 30.0–36.0)
MCV: 96.1 fL (ref 80.0–100.0)
Monocytes Absolute: 0.9 10*3/uL (ref 0.1–1.0)
Monocytes Relative: 11 %
Neutro Abs: 5.6 10*3/uL (ref 1.7–7.7)
Neutrophils Relative %: 66 %
Platelets: 863 10*3/uL — ABNORMAL HIGH (ref 150–400)
RBC: 2.56 MIL/uL — ABNORMAL LOW (ref 4.22–5.81)
RDW: 16.3 % — ABNORMAL HIGH (ref 11.5–15.5)
WBC: 8.3 10*3/uL (ref 4.0–10.5)
nRBC: 0 % (ref 0.0–0.2)

## 2020-01-04 LAB — COMPREHENSIVE METABOLIC PANEL
ALT: 84 U/L — ABNORMAL HIGH (ref 0–44)
AST: 95 U/L — ABNORMAL HIGH (ref 15–41)
Albumin: 1.5 g/dL — ABNORMAL LOW (ref 3.5–5.0)
Alkaline Phosphatase: 137 U/L — ABNORMAL HIGH (ref 38–126)
Anion gap: 10 (ref 5–15)
BUN: 9 mg/dL (ref 6–20)
CO2: 25 mmol/L (ref 22–32)
Calcium: 8.4 mg/dL — ABNORMAL LOW (ref 8.9–10.3)
Chloride: 99 mmol/L (ref 98–111)
Creatinine, Ser: 0.8 mg/dL (ref 0.61–1.24)
GFR calc Af Amer: >60 mL/min (ref >60)
GFR calc non Af Amer: >60 mL/min (ref >60)
Glucose, Bld: 101 mg/dL — ABNORMAL HIGH (ref 70–99)
Potassium: 4.2 mmol/L (ref 3.5–5.1)
Sodium: 134 mmol/L — ABNORMAL LOW (ref 135–145)
Total Bilirubin: 0.3 mg/dL (ref 0.3–1.2)
Total Protein: 5.8 g/dL — ABNORMAL LOW (ref 6.5–8.1)

## 2020-01-04 NOTE — Progress Notes (Signed)
Inpatient Rehabilitation  Patient information reviewed and entered into eRehab system by Chequita Mofield M. Ahmod Gillespie, M.A., CCC/SLP, PPS Coordinator.  Information including medical coding, functional ability and quality indicators will be reviewed and updated through discharge.    

## 2020-01-04 NOTE — Progress Notes (Signed)
[facility] PHYSICAL MEDICINE & REHABILITATION PROGRESS NOTE   Subjective/Complaints: Patient on bedpan this morning. Therapy offered commode, but he preferred bedpan. Was able to have small hard BM with soap suds enema yesterday. Requests again today. Patient denies abdominal pain currently, but nurse reports he has has complained of pain.  Has swelling at right groin incision site.  Hgb stable at 8.1   ROS- pt denies SOB, CP, Abd pain, N/V/D, constipation, visual changes,  Objective:   No results found. Recent Labs    01/04/20 0614  WBC 8.3  HGB 8.1*  HCT 24.6*  PLT 863*   Recent Labs    01/04/20 0614  NA 134*  K 4.2  CL 99  CO2 25  GLUCOSE 101*  BUN 9  CREATININE 0.80  CALCIUM 8.4*    Intake/Output Summary (Last 24 hours) at 01/04/2020 0905 Last data filed at 01/03/2020 1700 Gross per 24 hour  Intake 360 ml  Output --  Net 360 ml     Physical Exam: Vital Signs Blood pressure 114/67, pulse 61, temperature 98.6 F (37 C), temperature source Oral, resp. rate 18, height 5\' 5"  (1.651 m), weight 60.3 kg, SpO2 93 %.  .  Physical Exam  Nursing note and vitals reviewed.  Constitutional: No distress.  Cachetic but looks better overall, better color/fuller face; on bedpan. HENT:  Head: Normocephalic and atraumatic.  Mouth/Throat: No oropharyngeal exudate.  Patient with poor dentition -  Eyes: EOMs intact Neck: No tracheal deviation present.  Cardiovascular:  RRR  Respiratory:  CTA B/L, no W/R/R  GI:  Soft, NT, ND, (+) BS Musculoskeletal:  Cervical back: Normal range of motion and neck supple.  Comments: RUE 4+/5- deltoid, biceps, triceps, WE, grip, finger abd  LUE- deltoid 2+/5, otherwise 4+/5 in LUE RLE- at least 3+/5- wouldn't push secondary to "wounds" LLE- HF 3+/5- L AKA L shoulder painful per pt- but nothing specific on palpation- "has been out of socket before".  Neurological:. AO x 2- doesn't know date/day- is a little confused Sensation intact on  L AKA Sensation to LT decreased below knee on RLE  Skin: No rash noted. He is not diaphoretic. No erythema. R groin incision appears well healed but has swelling, nontender to palpation.  Left AKA with staples in place no drainage, no erythema- looks great- (+) dog ears. Bypass sites lower extremities dressed with palpable pulses R shin wound and R top of foot have dressing- wounds bleeding through- Buttocks- on R buttock- multiple areas slightly open- like previous pimples or moisture? Sacrum slightly open- appears due to moisture- <1cm x 1cm; L buttock has pressure ulcer - superficially open- pink, with a little slough in middle- ~ 5-7 cm Psychiatric:  More aware today   Assessment/Plan: 1. Functional deficits secondary to L AKA and fempop bypass which require 3+ hours per day of interdisciplinary therapy in a comprehensive inpatient rehab setting.  Physiatrist is providing close team supervision and 24 hour management of active medical problems listed below.  Physiatrist and rehab team continue to assess barriers to discharge/monitor patient progress toward functional and medical goals  Care Tool:  Bathing    Body parts bathed by patient: Right arm, Left arm, Chest, Abdomen, Face     Body parts n/a: Front perineal area, Buttocks, Right upper leg, Left upper leg, Right lower leg, Left lower leg   Bathing assist Assist Level: Supervision/Verbal cueing     Upper Body Dressing/Undressing Upper body dressing   What is the patient wearing?: Hospital gown  only    Upper body assist Assist Level: Moderate Assistance - Patient 50 - 74%    Lower Body Dressing/Undressing Lower body dressing    Lower body dressing activity did not occur: Refused What is the patient wearing?: Pants     Lower body assist Assist for lower body dressing: Moderate Assistance - Patient 50 - 74%     Toileting Toileting Toileting Activity did not occur Landscape architect and hygiene only): Refused   Toileting assist Assist for toileting: Maximal Assistance - Patient 25 - 49%     Transfers Chair/bed transfer  Transfers assist     Chair/bed transfer assist level: Moderate Assistance - Patient 50 - 74%     Locomotion Ambulation   Ambulation assist   Ambulation activity did not occur: Safety/medical concerns          Walk 10 feet activity   Assist  Walk 10 feet activity did not occur: Safety/medical concerns        Walk 50 feet activity   Assist Walk 50 feet with 2 turns activity did not occur: Safety/medical concerns         Walk 150 feet activity   Assist Walk 150 feet activity did not occur: Safety/medical concerns         Walk 10 feet on uneven surface  activity   Assist Walk 10 feet on uneven surfaces activity did not occur: Safety/medical concerns         Wheelchair     Assist Will patient use wheelchair at discharge?: Yes Type of Wheelchair: Manual    Wheelchair assist level: Supervision/Verbal cueing Max wheelchair distance: 125'    Wheelchair 50 feet with 2 turns activity    Assist        Assist Level: Supervision/Verbal cueing   Wheelchair 150 feet activity     Assist      Assist Level: Supervision/Verbal cueing   Blood pressure 114/67, pulse 61, temperature 98.6 F (37 C), temperature source Oral, resp. rate 18, height 5\' 5"  (1.651 m), weight 60.3 kg, SpO2 93 %.  Medical Problem List and Plan:  1. Decreased functional ability secondary to peripheral vascular disease status post left AKA 12/24/2019 as well as right femoral-popliteal bypass 12/30/2019  -patient may shower if L AKA is covered  -ELOS/Goals: Mod I to min assist- 10-14 days  2. Antithrombotics:  -DVT/anticoagulation: Subcutaneous heparin  -antiplatelet therapy: Aspirin 81 mg daily  3. Pain Management: Neurontin 100 mg 3 times daily, oxycodone as needed  4. Mood: Provide emotional support  -antipsychotic agents: N/A  5. Neuropsych:  This patient is capable of making decisions on his own behalf.  6. Skin/Wound Care- wounds on RLE, groin from R fempop bypass and Stage II on L buttocks: Routine skin checks/sacral foam prophylactic dressing change every third day- needs dressing changes for RLE as well.  1/1- L buttock pressure ulcer, sacral/coccyx looks like moisture- <1x1cm- few spots on R buttock- doesn't appear to be pressure- will consult WOC   1/4: Has swelling at right groin incision site. Nontender to palpation. Continue to monitor.  7. Fluids/Electrolytes/Nutrition: Routine in and outs with follow-up chemistries   1/1- good results in BMP today 8. Acute blood loss anemia. Follow-up CBC  -last CBC showed Hb of 8.4- down from 11 after surgery- will monitor   1/4:  Hgb stable at 8.1 9. Hypertension. Monitor with increased mobility   1/4: stable 10. Hyperlipidemia. Lipitor  11. History of tobacco alcohol use- at least 1/2ppd- . Provide counseling.  12. Vitamin B12 deficiency. Continue supplement  13. Constipation. Laxative assistance  1/2- will stop Colace and make prn as well as Miralax since having so many stools- might need to increase again, but for now, will make prn.   1/4: Was able to have small hard BM with soap suds enema yesterday. Requests again today, ordered. Patient denies abdominal pain currently, but nurse reports he has has complained of pain.  14. Alcohol abuse- pt finally admitted since retirement was drinking "1/2 pint every day of vodka"- monitor for withdrawal- likely drinking more.  15. Severe protein malnutrition- BMI is 20- but albumin is 1.6- which is significant and total protein is 5.7- will con't protein supplements, and if loses more weight, will consult nutrition.  16. L shoulder weakness/discomfort- will d/w OT in how it relates to function.  60. Ostemyelitis? - Pt was on Vanc- doesn't need Vanc anymore per vascular physicians since had L AKA.     LOS: 4 days A FACE TO FACE EVALUATION  WAS PERFORMED  Rachid Parham P Tamiko Leopard 01/04/2020, 9:05 AM

## 2020-01-04 NOTE — Progress Notes (Signed)
Occupational Therapy Session Note  Patient Details  Name: Terrence Carr MRN: [id number] Date of Birth: [birth year]  Today's Date: 01/04/2020 OT Individual Time: [id number]  &  [id number] OT Individual Time Calculation (min): 64 min & 55 min   Short Term Goals: Week 1:  OT Short Term Goal 1 (Week 1): STGs=LTGs due to ELOS  Skilled Therapeutic Interventions/Progress Updates:    session 1:  Patient in bed, states that his stomach is upset but willing to get up to w/c.  Patient requests use of bed pan - max A to place bed pan and max A for hygiene ( unable to have BM)   He is able to use urinal with set up - reviewed benefits of transfer to drop arm commode but he declined.  LB dressing completed bed level with mod/max A.  Dependent for right sock over wound dressing.  He is able to complete UB dressing with set up seated at edge of bed.  He declines oral care.  Brushes hair with set up.  Bed mobility with CS.  Sit pivot transfer bed to w/c with CS/CGA, cues for weight shift/clearing buttocks.  Patient propels w/c to/from therapy gym with set up/S.  Completed UB ergometer 1 x 5 min and 1 x 3 min.  He remained seated in w/c at close of session, seat belt alarm set and call bell in reach.  Session 2:  Patient seated in w/c - states that his pain is improved and he would like to complete conditioning activities.  He is able to propel w/c to/from therapy gym on other side of the unit.  He completed w/c push up, core mobility and trunk strengthening activities with cues for technique.  Reviewed various commode options and demonstrated appropriate use - he declines practice at this time - states that he has both standard and drop arm commodes at home.  Reviewed set up of w/c - he is able to position for transfer, remove arm rest and leg rest with cues.  Sit pivot transfer w/c to bed with CS and min cues for safety.  Bed mobility with CS.  He completed unsupported sitting balance activities and theraband  exercises.  Bed alarm set and call bell in reach at close of session.    Therapy Documentation Precautions:  Precautions Precautions: Fall Precaution Comments: Lt AKA, wounds R LE and Lt buttocks Restrictions Weight Bearing Restrictions: No RLE Weight Bearing: Weight bearing as tolerated LLE Weight Bearing: Non weight bearing General:   Vital Signs:   Pain: Pain Assessment Pain Scale: 0-10 Pain Score: 7  Faces Pain Scale: Hurts little more Pain Type: Acute pain;Surgical pain Pain Location: Groin Pain Orientation: Right Pain Descriptors / Indicators: Aching Pain Intervention(s): Repositioned   Therapy/Group: Individual Therapy  Carlos Levering 01/04/2020, 12:09 PM

## 2020-01-04 NOTE — Progress Notes (Addendum)
Physical Therapy Session Note  Patient Details  Name: Terrence Carr MRN: [id number] Date of Birth: [birth year]  Today's Date: 01/04/2020 PT Individual Time: 1300-1330 PT Individual Time Calculation (min): 30 min   Short Term Goals: Week 1:  PT Short Term Goal 1 (Week 1): STG=LTG due to ELOS  Skilled Therapeutic Interventions/Progress Updates:     Patient in bed eating lunch upon PT arrival. Patient alert and initially refused to work with PT, however, agreed to sit EOB and discuss POC while finishing lunch. Discussed home set up, equipment, PT goals, ELOS, and current mobility level based on previous notes. Educated on prevention of hip flexion and abduction contractures and prone lying 15 min per day, also educated on phantom limb pain and desensitization techniques. Discussed wounds on his R LE and performing skin checks daily at home with a hand-held mirror to B LEs. Patient was receptive to all education.   Therapeutic Activity: Bed Mobility: Patient performed supine to/from sit with min A with use of bed rails. Provided verbal cues for rolling R and setting R elbow to push to sit up.  Encouraged patient to participate in bed level exercises after finishing lunch, however, patient declined stating "I am just too wore out from this morning." Patient in bed at end of session with breaks locked, bed alarm set, and all needs within reach.    Therapy Documentation Precautions:  Precautions Precautions: Fall Precaution Comments: Lt AKA, wounds R LE and Lt buttocks Restrictions Weight Bearing Restrictions: No RLE Weight Bearing: Weight bearing as tolerated LLE Weight Bearing: Non weight bearing    Therapy/Group: Individual Therapy  Jaxson Anglin L Shameria Trimarco PT, DPT  01/04/2020, 4:10 PM

## 2020-01-05 ENCOUNTER — Inpatient Hospital Stay (HOSPITAL_COMMUNITY): Payer: Medicaid Other | Admitting: Occupational Therapy

## 2020-01-05 ENCOUNTER — Inpatient Hospital Stay (HOSPITAL_COMMUNITY): Payer: Medicaid Other

## 2020-01-05 MED ORDER — MAGNESIUM CITRATE PO SOLN
1.0000 | Freq: Once | ORAL | Status: AC
  Administered 2020-01-05: 1 via ORAL
  Filled 2020-01-05: qty 296

## 2020-01-05 MED ORDER — SENNA 8.6 MG PO TABS
1.0000 | ORAL_TABLET | Freq: Two times a day (BID) | ORAL | Status: DC
  Administered 2020-01-05 – 2020-01-08 (×7): 8.6 mg via ORAL
  Filled 2020-01-05 (×7): qty 1

## 2020-01-05 NOTE — Progress Notes (Addendum)
Physical Therapy Note  Patient Details  Name: Terrence Carr MRN: [id number] Date of Birth: Jan 30, 1961 Today's Date: 01/05/2020    Patient in bed upon PT arrival. Patient stated that he was in 9/10 stomach pain and that he was not up to doing anything with PT right now. RN made aware and brought pain medicine and stool softeners. Encouraged patient to participate in PT and sit up to drink coffee with medications. Patient initiated sitting EOB, then became agitated and yelled, "I told you I can't do anything," and the patient returned to lying in bed. Patient missed 75 min of skilled PT due to pain/refusal. Will attempt to make up missed time as able.   Stori Royse L Tyliyah Mcmeekin PT, DPT  01/05/2020, 11:02 AM

## 2020-01-05 NOTE — Patient Care Conference (Signed)
Inpatient RehabilitationTeam Conference and Plan of Care Update Date: 01/05/2020   Time: 11:35 AM    Patient Name: Terrence Carr      Medical Record Number: [id number]  Date of Birth: Feb 13, 1961 Sex: Male         Room/Bed: 4M10C/4M10C-01 Payor Info: Payor: MEDICAID [place] / Plan: MEDICAID [place] ACCESS / Product Type: *No Product type* /    Admit Date/Time:  12/31/2019  6:14 PM  Primary Diagnosis:  Left above-knee amputee [facility])  Patient Active Problem List   Diagnosis Date Noted  . Pressure ulcer of left buttock, stage 2 ([place]) 01/01/2020  . Left above-knee amputee ([place]) 12/31/2019  . S/P AKA (above knee amputation) unilateral, left ([place])   . Osteomyelitis of multiple sites ([place])   . Thrombocytosis ([place]) 12/24/2019  . Hypoglycemia 12/24/2019  . Osteomyelitis of ankle or foot, acute, right ([place]) 12/23/2019  . Lactic acidosis 12/23/2019  . Alcohol abuse 12/23/2019  . Alcohol withdrawal ([place]) 12/23/2019  . Macrocytic anemia 12/23/2019  . Sacral decubitus ulcer 12/23/2019  . Hyperglycemia 12/23/2019  . Essential hypertension 12/23/2019  . Peripheral arterial disease ([place]) 12/23/2019  . Vitamin B12 deficiency 12/23/2019    Expected Discharge Date: Expected Discharge Date: 01/08/20  Team Members Present: Physician leading conference: Dr. Courtney Heys Social Worker Present: Lennart Pall, LCSW Nurse Present: Ellison Carwin, LPN Case Manager: Karene Fry, RN PT Present: Apolinar Junes, PT OT Present: Elisabeth Most, OT SLP Present: Weston Anna, SLP PPS Coordinator present : Gunnar Fusi, Novella Olive, PT     Current Status/Progress Goal Weekly Team Focus  Bowel/Bladder   Continent with episodes of incontinence of b/b, mostly at night, constipation noted-requiring disimpaction and soap suds enema, LBM-01/04/20  Regain normal urinary and bowel pattern with continence  Assist with management of bowel and bladder, provide intervention as needed   Swallow/Nutrition/  Hydration             ADL's   UB dressing/bathing and grooming at set up/supervision level, LB dressing and bathing mod/max A, sit pivot transfers CGA/CS, cues for safety  increase LB bathing and dressing to min A, improve carryover and safety with sit pivot transfer to/from drop arm commode, toileting on drop arm commode with min A  family educ, functional transfer and adl training   Mobility   Min assist bed mobilty, min-mod assist sqaut pivot transfer, WC mobility with supervision assist.  Supervision assist at [facility] level  [place]/ROM, activity tolerance, functional mobility, amputee education, patient/caregiver education.   Communication             Safety/Cognition/ Behavioral Observations            Pain   Frequent complaints of pain to right leg-managed with prn tylenol and oxycodone-partially effective  Pain level at or <3/10  Assess for skin every shift and as needed   Skin   multiple pressure injuries to buttocks, black ulcer noted to medical aspect of anterior tibia, black/red ulcer noted to right lateral foot  Promote healing to impaired areas, no s/sx of infection, no further breakdown  Assess skin every shift and as needed, keep areas clean and dry, continue present wound care orders    Rehab Goals Patient on target to meet rehab goals: Yes *See Care Plan and progress notes for long and short-term goals.     Barriers to Discharge  Current Status/Progress Possible Resolutions Date Resolved   Nursing  PT  Lack of/limited family support  Patient's wife and step-daughter can only privide minimal to supervision assistance at home.              OT                  SLP                SW                Discharge Planning/Teaching Needs:  Home with girlfriend, however, step daughter to provide primary assistanc.e  Teaching needs TBD   Team Discussion: Fem pop on right side, constipation, L buttock stage 2 wound, mag citrate and SS enema ordered.  RN  mag citrate given, A+O, headstrong, cont B/B, pain R leg, wound care R foot/toe, houl smell and drainage, tylenol and oxy given.  OT refused commode, uses urinal I, soiled his brief, pants min A, transfers close E even surface, S goals.  PT refused yesterday, fatigue, refused today secondary to pain, transfers last session were min/mod A, has S goals.  Lives with girlfriend.   Revisions to Treatment Plan: N/A     Medical Summary Current Status: continent of bladder; digs bowel out in bed; pain in RLE Weekly Focus/Goal: RLE has foul smell- to foot and toe; refuses to get on commode for BM; ADLs- min assist to set-up; close Supervision transfers  Barriers to Discharge: Behavior;Weight bearing restrictions;Weight;Incontinence;Decreased family/caregiver support;Wound care;Medication compliance;Home enviroment access/layout  Barriers to Discharge Comments: constipation- severe- Mg citrate/Soap Suds enema Possible Resolutions to Barriers: PT- refused therapy yesterday and this AM; min-mod assist with last PT; might need RW? Hasn't stood at all;   Continued Need for Acute Rehabilitation Level of Care: The patient requires daily medical management by a physician with specialized training in physical medicine and rehabilitation for the following reasons: Direction of a multidisciplinary physical rehabilitation program to maximize functional independence : Yes Medical management of patient stability for increased activity during participation in an intensive rehabilitation regime.: Yes Analysis of laboratory values and/or radiology reports with any subsequent need for medication adjustment and/or medical intervention. : Yes   I attest that I was present, lead the team conference, and concur with the assessment and plan of the team.   Retta Diones 01/05/2020, 10:32 PM   Team conference was held via web/ teleconference due to [place] - 19

## 2020-01-05 NOTE — Progress Notes (Signed)
Patient complained of stomach pain and rectum pain during shift. Mag cit given as ordered extra large bowel movements noted multiple times after. Patient states his stomach feels much better. Adria Devon, LPN

## 2020-01-05 NOTE — Plan of Care (Signed)
  Problem: Consults Goal: Skin Care Protocol Initiated - if Braden Score 18 or less Description: If consults are not indicated, leave blank or document N/A Outcome: Progressing Goal: Nutrition Consult-if indicated Outcome: Progressing   Problem: RH BOWEL ELIMINATION Goal: RH STG MANAGE BOWEL WITH ASSISTANCE Description: STG Manage Bowel with [place]. Outcome: Progressing   Problem: RH SKIN INTEGRITY Goal: RH STG SKIN FREE OF INFECTION/BREAKDOWN Outcome: Progressing Goal: RH STG MAINTAIN SKIN INTEGRITY WITH ASSISTANCE Description: STG Maintain Skin Integrity With  Mod Assistance. Outcome: Progressing Goal: RH STG ABLE TO PERFORM INCISION/WOUND CARE W/ASSISTANCE Description: STG Able To Perform Incision/Wound Care With  Mod Assistance. Outcome: Progressing   Problem: Consults Goal: RH LIMB LOSS PATIENT EDUCATION Description: Description: See Patient Education module for eduction specifics. Outcome: Progressing

## 2020-01-05 NOTE — Progress Notes (Addendum)
[place] PHYSICAL MEDICINE & REHABILITATION PROGRESS NOTE   Subjective/Complaints:  Had soap suds enema x last 2 days- KUB showed still moderate stool burden.  Other than bowels, feels good. Up all night trying to poop. Asked if can miss therapy today due to fatigue- explained can miss ONE therapy session max; otherwise, needs to go to therapy.   ROS- pt denies SOB, CP, Abd pain, N/V/D, (+) constipation, visual changes,  Objective:   DG Abd 1 View  Result Date: 01/04/2020 CLINICAL DATA:  Constipation EXAM: ABDOMEN - 1 VIEW COMPARISON:  09/04/2019 FINDINGS: No dilated large or small bowel. Normal moderate stool volume. Gas the rectum. Calcification of the iliac arteries with stent in the RIGHT common iliac artery. IMPRESSION: No bowel obstruction.  Moderate volume stool. Electronically Signed   By: Suzy Bouchard M.D.   On: 01/04/2020 13:03   Recent Labs    01/04/20 0614  WBC 8.3  HGB 8.1*  HCT 24.6*  PLT 863*   Recent Labs    01/04/20 0614  NA 134*  K 4.2  CL 99  CO2 25  GLUCOSE 101*  BUN 9  CREATININE 0.80  CALCIUM 8.4*    Intake/Output Summary (Last 24 hours) at 01/05/2020 0854 Last data filed at 01/05/2020 0742 Gross per 24 hour  Intake 1498 ml  Output 1800 ml  Net -302 ml     Physical Exam: Vital Signs Blood pressure (!) 121/51, pulse 65, temperature 98.2 F (36.8 C), temperature source Oral, resp. rate 16, height 5\' 5"  (1.651 m), weight 60.3 kg, SpO2 100 %.  .  Physical Exam  Nursing note and vitals reviewed.  Constitutional: No distress.  Cachetic but looks better overall, better color/fuller face; sitting up in bed; watching TV, NAD HENT:  Head: Normocephalic and atraumatic.  Mouth/Throat: No oropharyngeal exudate.  Patient with poor dentition -  Eyes: EOMs intact Neck: No tracheal deviation present.  Cardiovascular:  RRR  Respiratory:  CTA B/L, no W/R/R  GI:  Soft, NT, ND, (+) BS Musculoskeletal:  Cervical back: Normal range of motion and  neck supple.  Comments: RUE 4+/5- deltoid, biceps, triceps, WE, grip, finger abd  LUE- deltoid 2+/5, otherwise 4+/5 in LUE RLE- at least 3+/5- wouldn't push secondary to "wounds" LLE- HF 3+/5- L AKA Neurological:. Sensation intact on L AKA Sensation to LT decreased below knee on RLE  Skin: No rash noted. He is not diaphoretic. No erythema. R groin incision appears well healed but has swelling, nontender to palpation.  Left AKA with staples in place no drainage, no erythema- looks great- (+) dog ears. Bypass sites lower extremities dressed with palpable pulses R shin wound and R top of foot have dressing- wounds bleeding through- Buttocks- on R buttock- multiple areas slightly open- like previous pimples or moisture? Sacrum slightly open- appears due to moisture- <1cm x 1cm; L buttock has pressure ulcer - superficially open- pink, with a little slough in middle- ~ 5-7 cm Psychiatric:  More aware today   Assessment/Plan: 1. Functional deficits secondary to L AKA and fempop bypass which require 3+ hours per day of interdisciplinary therapy in a comprehensive inpatient rehab setting.  Physiatrist is providing close team supervision and 24 hour management of active medical problems listed below.  Physiatrist and rehab team continue to assess barriers to discharge/monitor patient progress toward functional and medical goals  Care Tool:  Bathing    Body parts bathed by patient: Right arm, Left arm, Chest, Abdomen, Face     Body parts  n/a: Front perineal area, Buttocks, Right upper leg, Left upper leg, Right lower leg, Left lower leg   Bathing assist Assist Level: Supervision/Verbal cueing     Upper Body Dressing/Undressing Upper body dressing   What is the patient wearing?: Pull over shirt    Upper body assist Assist Level: Set up assist    Lower Body Dressing/Undressing Lower body dressing    Lower body dressing activity did not occur: Refused What is the patient wearing?:  Pants, Incontinence brief     Lower body assist Assist for lower body dressing: Moderate Assistance - Patient 50 - 74%     Toileting Toileting Toileting Activity did not occur Landscape architect and hygiene only): Refused  Toileting assist Assist for toileting: Maximal Assistance - Patient 25 - 49%     Transfers Chair/bed transfer  Transfers assist     Chair/bed transfer assist level: Moderate Assistance - Patient 50 - 74%     Locomotion Ambulation   Ambulation assist   Ambulation activity did not occur: Safety/medical concerns          Walk 10 feet activity   Assist  Walk 10 feet activity did not occur: Safety/medical concerns        Walk 50 feet activity   Assist Walk 50 feet with 2 turns activity did not occur: Safety/medical concerns         Walk 150 feet activity   Assist Walk 150 feet activity did not occur: Safety/medical concerns         Walk 10 feet on uneven surface  activity   Assist Walk 10 feet on uneven surfaces activity did not occur: Safety/medical concerns         Wheelchair     Assist Will patient use wheelchair at discharge?: Yes Type of Wheelchair: Manual    Wheelchair assist level: Supervision/Verbal cueing Max wheelchair distance: 125'    Wheelchair 50 feet with 2 turns activity    Assist        Assist Level: Supervision/Verbal cueing   Wheelchair 150 feet activity     Assist      Assist Level: Supervision/Verbal cueing   Blood pressure (!) 121/51, pulse 65, temperature 98.2 F (36.8 C), temperature source Oral, resp. rate 16, height 5\' 5"  (1.651 m), weight 60.3 kg, SpO2 100 %.  Medical Problem List and Plan:  1. Decreased functional ability secondary to peripheral vascular disease status post left AKA 12/24/2019 as well as right femoral-popliteal bypass 12/30/2019  -patient may shower if L AKA is covered  -ELOS/Goals: Mod I to min assist- 10-14 days  2. Antithrombotics:   -DVT/anticoagulation: Subcutaneous heparin  -antiplatelet therapy: Aspirin 81 mg daily  3. Pain Management: Neurontin 100 mg 3 times daily, oxycodone as needed  4. Mood: Provide emotional support  -antipsychotic agents: N/A  5. Neuropsych: This patient is capable of making decisions on his own behalf.  6. Skin/Wound Care- wounds on RLE, groin from R fempop bypass and Stage II on L buttocks: Routine skin checks/sacral foam prophylactic dressing change every third day- needs dressing changes for RLE as well.  1/1- L buttock pressure ulcer, sacral/coccyx looks like moisture- <1x1cm- few spots on R buttock- doesn't appear to be pressure- will consult WOC   1/4: Has swelling at right groin incision site. Nontender to palpation. Continue to monitor.  7. Fluids/Electrolytes/Nutrition: Routine in and outs with follow-up chemistries   1/1- good results in BMP today 8. Acute blood loss anemia. Follow-up CBC  -last CBC showed Hb  of 8.4- down from 11 after surgery- will monitor   1/4:  Hgb stable at 8.1 9. Hypertension. Monitor with increased mobility   1/4: stable 10. Hyperlipidemia. Lipitor  11. History of tobacco alcohol use- at least 1/2ppd- . Provide counseling.  12. Vitamin B12 deficiency. Continue supplement  13. Constipation. Laxative assistance  1/2- will stop Colace and make prn as well as Miralax since having so many stools- might need to increase again, but for now, will make prn.   1/4: Was able to have small hard BM with soap suds enema yesterday. Requests again today, ordered. Patient denies abdominal pain currently, but nurse reports he has has complained of pain.  1/5- Will order Mg citrate then soap suds enema based on KUB. Will add Senokot 1 BID 14. Alcohol abuse- pt finally admitted since retirement was drinking "1/2 pint every day of vodka"- monitor for withdrawal- likely drinking more.  15. Severe protein malnutrition- BMI is 20- but albumin is 1.6- which is significant and total  protein is 5.7- will con't protein supplements, and if loses more weight, will consult nutrition.  16. L shoulder weakness/discomfort- will d/w OT in how it relates to function.  79. Ostemyelitis? - Pt was on Vanc- doesn't need Vanc anymore per vascular physicians since had L AKA.     LOS: 5 days A FACE TO FACE EVALUATION WAS PERFORMED  Jmya Uliano 01/05/2020, 8:54 AM

## 2020-01-05 NOTE — Progress Notes (Signed)
Occupational Therapy Session Note  Patient Details  Name: Terrence Carr MRN: [id number] Date of Birth: 1961-12-25  Today's Date: 01/05/2020 OT Individual Time:  -     Patient refusing therapy this afternoon - states that he is tired and does not feel well but will not specify.  Attempts to complete this therapy session at 1415 and again at 1445.    Short Term Goals: Week 1:  OT Short Term Goal 1 (Week 1): STGs=LTGs due to ELOS  Skilled Therapeutic Interventions/Progress Updates:    patient did not attend therapy session this afternoon.  I did speak on the telephone with his daughter to confirm DME - daughter states that the patient does not have a hospital bed or commode.  I recommend drop arm commode for discharge to home.     Therapy Documentation Precautions:  Precautions Precautions: Fall Precaution Comments: Lt AKA, wounds R LE and Lt buttocks Restrictions Weight Bearing Restrictions: No RLE Weight Bearing: Weight bearing as tolerated LLE Weight Bearing: Non weight bearing General: General OT Amount of Missed Time: 75 Minutes Vital Signs: Therapy Vitals Temp: 98.1 F (36.7 C) Pulse Rate: 76 Resp: 18 BP: 108/61 Patient Position (if appropriate): Lying Oxygen Therapy SpO2: 99 % O2 Device: Room Air Pain:     Therapy/Group: Individual Therapy  Carlos Levering 01/05/2020, 2:51 PM

## 2020-01-05 NOTE — Progress Notes (Signed)
Occupational Therapy Session Note  Patient Details  Name: Terrence Carr MRN: [id number] Date of Birth: 1961-03-25  Today's Date: 01/05/2020 OT Individual Time: [id number] OT Individual Time Calculation (min): 59 min    Short Term Goals: Week 1:  OT Short Term Goal 1 (Week 1): STGs=LTGs due to ELOS  Skilled Therapeutic Interventions/Progress Updates:    Patient in bed, states that he did not sleep well last night but agreeable to participating in therapy this morning after encouragement.  He is able to donn pants at bed level min A.  Dependent for sock over right LE dressing.  Bed mobility with CS.  Sit pivot transfer bed to w/c with CS and assist for w/c set up.  Grooming tasks completed at set up level.  He used urinal independently - refused transfer to drop arm commode.  He is able to propel his w/c to/from therapy gym.   Completed ergometer 2 x 5 minutes.  Completed weight shifts and modified w/c push ups with CS.  He remained in the w/c at close of session with seat belt alarm set and call bell in reach.    Therapy Documentation Precautions:  Precautions Precautions: Fall Precaution Comments: Lt AKA, wounds R LE and Lt buttocks Restrictions Weight Bearing Restrictions: No RLE Weight Bearing: Weight bearing as tolerated LLE Weight Bearing: Non weight bearing General:   Vital Signs:  Pain: Pain Assessment Pain Scale: 0-10 Pain Score: 4  Pain Type: Acute pain Pain Location: Leg Pain Orientation: Right Pain Descriptors / Indicators: Nagging Pain Intervention(s): Repositioned   Therapy/Group: Individual Therapy  Carlos Levering 01/05/2020, 10:07 AM

## 2020-01-06 ENCOUNTER — Inpatient Hospital Stay (HOSPITAL_COMMUNITY): Payer: Medicaid Other | Admitting: Occupational Therapy

## 2020-01-06 ENCOUNTER — Inpatient Hospital Stay (HOSPITAL_COMMUNITY): Payer: Medicaid Other

## 2020-01-06 MED ORDER — HYDROCERIN EX CREA
TOPICAL_CREAM | Freq: Two times a day (BID) | CUTANEOUS | Status: DC
  Filled 2020-01-06: qty 113

## 2020-01-06 NOTE — Plan of Care (Signed)
  Problem: RH Balance Goal: LTG Patient will maintain dynamic standing balance (PT) Description: LTG:  Patient will maintain dynamic standing balance with assistance during mobility activities (PT) Outcome: Not Applicable due to wounds on R LE causing pain.   Problem: Sit to Stand Goal: LTG:  Patient will perform sit to stand with assistance level (PT) Description: LTG:  Patient will perform sit to stand with assistance level (PT) Outcome: Not Applicable due to wounds on R LE causing pain.   Problem: RH Ambulation Goal: LTG Patient will ambulate in controlled environment (PT) Description: LTG: Patient will ambulate in a controlled environment, # of feet with assistance (PT). Outcome: Not Applicable due to wounds on R LE causing pain.   Problem: RH Stairs Goal: LTG Patient will ambulate up and down stairs w/assist (PT) Description: LTG: Patient will ambulate up and down # of stairs with assistance (PT) Outcome: Not Applicable due to wounds on R LE causing pain.

## 2020-01-06 NOTE — Progress Notes (Signed)
[place] PHYSICAL MEDICINE & REHABILITATION PROGRESS NOTE   Subjective/Complaints:  [name] reports feeling much better since spent 1+ hours pooping yesterday- feels a lot better- asking for something for dry skin on RLE.    ROS- pt denies SOB, CP, Abd pain, N/V/D, (+) constipation, visual changes,  Objective:   DG Abd 1 View  Result Date: 01/04/2020 CLINICAL DATA:  Constipation EXAM: ABDOMEN - 1 VIEW COMPARISON:  09/04/2019 FINDINGS: No dilated large or small bowel. Normal moderate stool volume. Gas the rectum. Calcification of the iliac arteries with stent in the RIGHT common iliac artery. IMPRESSION: No bowel obstruction.  Moderate volume stool. Electronically Signed   By: Suzy Bouchard M.D.   On: 01/04/2020 13:03   Recent Labs    01/04/20 0614  WBC 8.3  HGB 8.1*  HCT 24.6*  PLT 863*   Recent Labs    01/04/20 0614  NA 134*  K 4.2  CL 99  CO2 25  GLUCOSE 101*  BUN 9  CREATININE 0.80  CALCIUM 8.4*    Intake/Output Summary (Last 24 hours) at 01/06/2020 0840 Last data filed at 01/06/2020 0814 Gross per 24 hour  Intake 960 ml  Output 1350 ml  Net -390 ml     Physical Exam: Vital Signs Blood pressure (!) 119/56, pulse 68, temperature 98.7 F (37.1 C), temperature source Oral, resp. rate 17, height 5\' 5"  (1.651 m), weight 60.3 kg, SpO2 99 %.  .  Physical Exam  Nursing note and vitals reviewed.  Constitutional: No distress.  Cachetic but looks better overall, better color/fuller face; sitting up in bed; EOB; picking at dry itchy skin on RLE from fempop bypass, NAD HENT:  Head: Normocephalic and atraumatic.  Mouth/Throat: No oropharyngeal exudate.  Patient with poor dentition -  Eyes: EOMs intact Neck: No tracheal deviation present.  Cardiovascular:  RRR  Respiratory:  CTA B/L, no W/R/R  GI:  Soft, NT, ND, (+) BS Musculoskeletal:  Cervical back: Normal range of motion and neck supple.  Comments: RUE 4+/5- deltoid, biceps, triceps, WE, grip, finger  abd  LUE- deltoid 2+/5, otherwise 4+/5 in LUE RLE- at least 3+/5- wouldn't push secondary to "wounds" LLE- HF 3+/5- L AKA Neurological:. Sensation intact on L AKA Sensation to LT decreased below knee on RLE  Skin: No rash noted. He is not diaphoretic. No erythema. R groin incision appears well healed but has swelling, nontender to palpation.  Left AKA with staples in place no drainage, no erythema- looks great- (+) dog ears. Bypass sites lower extremities dressed with palpable pulses R shin wound and R top of foot have dressing- wounds bleeding through- Buttocks- on R buttock- multiple areas slightly open- like previous pimples or moisture? Sacrum slightly open- appears due to moisture- <1cm x 1cm; L buttock has pressure ulcer - superficially open- pink, with a little slough in middle- ~ 5-7 cm Dry skin along fem-pop bypass on RLE Psychiatric:  More aware today   Assessment/Plan: 1. Functional deficits secondary to L AKA and fempop bypass which require 3+ hours per day of interdisciplinary therapy in a comprehensive inpatient rehab setting.  Physiatrist is providing close team supervision and 24 hour management of active medical problems listed below.  Physiatrist and rehab team continue to assess barriers to discharge/monitor patient progress toward functional and medical goals  Care Tool:  Bathing    Body parts bathed by patient: Right arm, Left arm, Chest, Abdomen, Face     Body parts n/a: Front perineal area, Buttocks, Right upper leg,  Left upper leg, Right lower leg, Left lower leg   Bathing assist Assist Level: Supervision/Verbal cueing     Upper Body Dressing/Undressing Upper body dressing   What is the patient wearing?: Pull over shirt    Upper body assist Assist Level: Set up assist    Lower Body Dressing/Undressing Lower body dressing    Lower body dressing activity did not occur: Refused What is the patient wearing?: Pants     Lower body assist Assist for  lower body dressing: Minimal Assistance - Patient > 75%     Toileting Toileting Toileting Activity did not occur Landscape architect and hygiene only): Refused  Toileting assist Assist for toileting: Maximal Assistance - Patient 25 - 49%     Transfers Chair/bed transfer  Transfers assist     Chair/bed transfer assist level: Moderate Assistance - Patient 50 - 74%     Locomotion Ambulation   Ambulation assist   Ambulation activity did not occur: Safety/medical concerns          Walk 10 feet activity   Assist  Walk 10 feet activity did not occur: Safety/medical concerns        Walk 50 feet activity   Assist Walk 50 feet with 2 turns activity did not occur: Safety/medical concerns         Walk 150 feet activity   Assist Walk 150 feet activity did not occur: Safety/medical concerns         Walk 10 feet on uneven surface  activity   Assist Walk 10 feet on uneven surfaces activity did not occur: Safety/medical concerns         Wheelchair     Assist Will patient use wheelchair at discharge?: Yes Type of Wheelchair: Manual    Wheelchair assist level: Supervision/Verbal cueing Max wheelchair distance: 125'    Wheelchair 50 feet with 2 turns activity    Assist        Assist Level: Supervision/Verbal cueing   Wheelchair 150 feet activity     Assist      Assist Level: Supervision/Verbal cueing   Blood pressure (!) 119/56, pulse 68, temperature 98.7 F (37.1 C), temperature source Oral, resp. rate 17, height 5\' 5"  (1.651 m), weight 60.3 kg, SpO2 99 %.  Medical Problem List and Plan:  1. Decreased functional ability secondary to peripheral vascular disease status post left AKA 12/24/2019 as well as right femoral-popliteal bypass 12/30/2019  -patient may shower if L AKA is covered  -ELOS/Goals: Mod I to min assist- 10-14 days  2. Antithrombotics:  -DVT/anticoagulation: Subcutaneous heparin  -antiplatelet therapy: Aspirin  81 mg daily  3. Pain Management: Neurontin 100 mg 3 times daily, oxycodone as needed  4. Mood: Provide emotional support  -antipsychotic agents: N/A  5. Neuropsych: This patient is capable of making decisions on his own behalf.  6. Skin/Wound Care- wounds on RLE, groin from R fempop bypass and Stage II on L buttocks: Routine skin checks/sacral foam prophylactic dressing change every third day- needs dressing changes for RLE as well.  1/1- L buttock pressure ulcer, sacral/coccyx looks like moisture- <1x1cm- few spots on R buttock- doesn't appear to be pressure- will consult WOC   1/4: Has swelling at right groin incision site. Nontender to palpation. Continue to monitor.  7. Fluids/Electrolytes/Nutrition: Routine in and outs with follow-up chemistries   1/1- good results in BMP today 8. Acute blood loss anemia. Follow-up CBC  -last CBC showed Hb of 8.4- down from 11 after surgery- will monitor  1/4:  Hgb stable at 8.1 9. Hypertension. Monitor with increased mobility   1/6- stable- con't regimen 10. Hyperlipidemia. Lipitor  11. History of tobacco alcohol use- at least 1/2ppd- . Provide counseling.  12. Vitamin B12 deficiency. Continue supplement  13. Constipation. Laxative assistance  1/2- will stop Colace and make prn as well as Miralax since having so many stools- might need to increase again, but for now, will make prn.   1/4: Was able to have small hard BM with soap suds enema yesterday. Requests again today, ordered. Patient denies abdominal pain currently, but nurse reports he has has complained of pain.  1/5- Will order Mg citrate then soap suds enema based on KUB. Will add Senokot 1 BID  1/6- Good BMs- cleaned out yesterday afternoon 14. Alcohol abuse- pt finally admitted since retirement was drinking "1/2 pint every day of vodka"- monitor for withdrawal- likely drinking more.  15. Severe protein malnutrition- BMI is 20- but albumin is 1.6- which is significant and total protein is  5.7- will con't protein supplements, and if loses more weight, will consult nutrition.  16. L shoulder weakness/discomfort- will d/w OT in how it relates to function.  14. Ostemyelitis? - Pt was on Vanc- doesn't need Vanc anymore per vascular physicians since had L AKA.     LOS: 6 days A FACE TO FACE EVALUATION WAS PERFORMED  Terrence Carr 01/06/2020, 8:40 AM

## 2020-01-06 NOTE — Progress Notes (Signed)
Patient complained of pain in Right groin at end of shift. Area is swollen and tender to touch. Ice applied and pain medication given.   Patient refusing to drink Juven and Ensure today. Will report off to oncoming shift.

## 2020-01-06 NOTE — Progress Notes (Signed)
Occupational Therapy Session Note  Patient Details  Name: Terrence Carr MRN: [id number] Date of Birth: Jan 03, 1961  Today's Date: 01/06/2020 OT Individual Time:  - attempted to complete therapy session at 11:00 offering a variety of activities  - patient refused, made second attempt at 12:30 - he continues to refuse at this time.        Short Term Goals: Week 1:  OT Short Term Goal 1 (Week 1): STGs=LTGs due to ELOS  Skilled Therapeutic Interventions/Progress Updates:    patient declined all attempts to participate in therapeutic activity  Therapy Documentation Precautions:  Precautions Precautions: Fall Precaution Comments: Lt AKA, wounds R LE and Lt buttocks Restrictions Weight Bearing Restrictions: No RLE Weight Bearing: Weight bearing as tolerated LLE Weight Bearing: Non weight bearing General: General OT Amount of Missed Time: 60 Minutes Vital Signs: Therapy Vitals Temp: 98.2 F (36.8 C) Temp Source: Oral Pulse Rate: (!) 58 Resp: 16 BP: (!) 125/57 Patient Position (if appropriate): Lying Oxygen Therapy SpO2: 100 % O2 Device: Room Air Pain: Pain Assessment Pain Scale: Faces Faces Pain Scale: Hurts whole lot Pain Type: Acute pain Pain Location: Generalized Pain Descriptors / Indicators: Aching Pain Frequency: Intermittent Pain Intervention(s): Medication (See eMAR)  Therapy/Group: Individual Therapy  Carlos Levering 01/06/2020, 12:41 PM

## 2020-01-06 NOTE — Discharge Summary (Signed)
Physician Discharge Summary  Patient ID: Terrence Carr MRN: [id number] DOB/AGE: 1961-08-10 59 y.o.  Admit date: 12/31/2019 Discharge date: 01/08/2020  Discharge Diagnoses:  Principal Problem:   Left above-knee amputee [facility]) Active Problems:   Sacral decubitus ulcer   Essential hypertension   Osteomyelitis of multiple sites ([place])   Pressure ulcer of left buttock, stage 2 (H[facility]) DVT prophylaxis Acute blood loss anemia Constipation Decreased nutritional storage Alcohol abuse  Discharged Condition: Stable  Significant Diagnostic Studies: DG Tibia/Fibula Left  Result Date: 12/23/2019 CLINICAL DATA:  Multiple wounds. Assessment for osteomyelitis. EXAM: LEFT TIBIA AND FIBULA - 2 VIEW COMPARISON:  None FINDINGS: Diffuse mild soft tissue swelling of the left lower leg. Question some subcortical lucency along the distal fibular metadiaphysis with overlying material. If there is visible ulceration at this location, could reflect early features of osteomyelitis. No other erosion, destructive change, or immature periostitis to suggest a discrete focus of infection. Few fragments in the suprapatellar joint recess of the left knee, may be posttraumatic or degenerative in nature. Serpiginous sclerosis in the distal femur likely sequela of prior infarct. Atherosclerotic calcifications noted in the posterior knee. IMPRESSION: 1. Question subtle subcortical lucency along the distal fibular metadiaphysis with overlying bandage material. If there is visible ulceration at this location, could reflect early features of osteomyelitis. 2. No other erosion, destructive change, or immature periostitis to suggest a discrete focus of infection. Electronically Signed   By: Lovena Le M.D.   On: 12/23/2019 18:59   DG Tibia/Fibula Right  Result Date: 12/23/2019 CLINICAL DATA:  Wounds, assess for ostia EXAM: RIGHT TIBIA AND FIBULA - 2 VIEW COMPARISON:  Concurrent ankle radiographs FINDINGS: Diffuse edematous  thickening of the right lower extremity which is increasing towards the level of the foot. There is circumferential swelling at the level of the ankle with a large effusion and some questionable gas suspicious lucency noted in the subcortical bone of the distal tibial metaphysis. Mild tricompartmental degenerative changes are seen at the knee. Vascular calcium in the soft tissues. No acute fracture or traumatic malalignment. IMPRESSION: 1. Diffuse edematous thickening of the right lower extremity which is increasing towards the level of the foot. 2. There is a large ankle effusion, questionable gas in the joint, and circumferential swelling at the level of the ankle. 3. Subcortical lucency at the distal tibial metaphysis, worrisome for osteomyelitis. Electronically Signed   By: Lovena Le M.D.   On: 12/23/2019 19:09   DG Abd 1 View  Result Date: 01/04/2020 CLINICAL DATA:  Constipation EXAM: ABDOMEN - 1 VIEW COMPARISON:  09/04/2019 FINDINGS: No dilated large or small bowel. Normal moderate stool volume. Gas the rectum. Calcification of the iliac arteries with stent in the RIGHT common iliac artery. IMPRESSION: No bowel obstruction.  Moderate volume stool. Electronically Signed   By: Suzy Bouchard M.D.   On: 01/04/2020 13:03   PERIPHERAL VASCULAR CATHETERIZATION  Result Date: 12/28/2019 Patient name: Terrence Carr MRN: [id number] DOB: Apr 01, 1961 Sex: male 12/28/2019 Pre-operative Diagnosis: Critical right lower extremity ischemia with wounds Post-operative diagnosis:  Same Surgeon:  Erlene Quan C. Donzetta Matters, MD Procedure Performed: 1.  Ultrasound-guided cannulation left common femoral artery 2.  Aortogram 3.  Selection of right common femoral artery and right lower extremity angiography 4.  Moderate sedation with fentanyl and Versed for 22 minutes Indications: 59 year old male presented with bilateral lower extremity ischemia.  He is undergone left above-knee amputation.  He is now indicated for right lower  extremity angiography possible intervention. Findings: Aorta and iliac  segments are free of flow-limiting stenosis.  The right side common iliac artery has a stent that appears patent.  Common femoral artery has patent patch.  The SFA is flush occluded.  He reconstitutes above the knee popliteal artery which is diseased to below the knee.  Does have three-vessel runoff to the foot. He will be considered for right femoral to popliteal artery bypass.  Procedure:  The patient was identified in the holding area and taken to room 8.  The patient was then placed supine on the table and prepped and draped in the usual sterile fashion.  A time out was called.  Ultrasound was used to evaluate the left common femoral artery.  There is anesthetized 1% lidocaine cannulated with direct ultrasound visualization a micropuncture needle followed by wire sheath.  Bentson wire was placed.  And images saved the permanent record of the ultrasound-guided cannulation site.  5 French sheath was placed.  Omni catheter placed to level L1 aortogram performed.  We crossed bifurcation perform right lower extremity angiography.  With above findings patient will need bypass surgery.  He remains high risk for amputation.  Catheter was removed over wire.  Sheath will be pulled in postoperative holding.  He tolerated procedure well without any complication Contrast: 65 cc Brandon C. Donzetta Matters, MD [facility] Office: [phone] Pager: [phone]  DG Foot Complete Left  Result Date: 12/23/2019 CLINICAL DATA:  Wounds, assess for osteomyelitis EXAM: LEFT FOOT - COMPLETE 3+ VIEW COMPARISON:  None. FINDINGS: Diffuse soft tissue swelling of the foot. There is ulceration noted along the plantar aspect of the heel and posterolateral aspect of the foot. No subjacent osseous erosion, cortical lucency or immature periostitis to suggest site of osteomyelitis. No other radiographically evident site of ulceration. Vascular  calcium noted in the soft tissues. Diffuse degenerative changes in the midfoot are noted most pronounced involving navicular, medial cuneiform and base of the first metatarsal. Clawtoe deformities of the second through fifth digits. IMPRESSION: 1. Soft tissue ulceration along the plantar aspect of the heel and posterolateral aspect of the foot. 2. No convincing radiographic evidence for osteomyelitis. If there is persisting concern contrast-enhanced MRI is more sensitive and specific. 3. Diffuse soft tissue swelling of the foot. 4. Diffuse degenerative changes in the midfoot. Electronically Signed   By: Lovena Le M.D.   On: 12/23/2019 19:02   DG Foot Complete Right  Result Date: 12/23/2019 CLINICAL DATA:  Wounds, assess for osteomyelitis. EXAM: RIGHT FOOT COMPLETE - 3+ VIEW COMPARISON:  None. FINDINGS: There is diffuse soft tissue swelling and ulceration of the foot at the level of the plantar calcaneus and along the medial aspect of the head of the first metatarsal. There is some transcortical lucency at the base of the first distal phalanx. The osseous structures appear diffusely demineralized which may limit detection of small or nondisplaced fractures or subtle osseous features of osteomyelitis. There is questionable intra-articular gas at the level of the ankle. There is a severe hallux valgus deformity of the first ray with marked clawtoe deformities of the second through fifth digits. Extensive degenerative change noted through the mid and hindfoot as well as at the level of the ankle. No acute fracture or malalignment is seen. Ankle effusion is noted. IMPRESSION: 1. Diffuse soft tissue swelling and focal ulceration of the foot at the level of the plantar calcaneus and along the medial aspect of the head of the first metatarsal. 2. There is some transcortical lucency at the base of  the first distal phalanx which could be secondary to osteomyelitis. 3. Question some intra-articular gas at the level of  the ankle. Consider cross-sectional imaging. 4. Diffuse bony demineralization and degenerative changes, as above. Electronically Signed   By: Lovena Le M.D.   On: 12/23/2019 19:07   VAS [place] ABI WITH/WO TBI  Result Date: 12/27/2019 LOWER EXTREMITY DOPPLER STUDY Indications: Gangrene.  Vascular Interventions: Left AKA. Comparison Study: No prior study. Performing Technologist: Maudry Mayhew MHA, RVT, RDCS, RDMS  Examination Guidelines: A complete evaluation includes at minimum, Doppler waveform signals and systolic blood pressure reading at the level of bilateral brachial, anterior tibial, and posterior tibial arteries, when vessel segments are accessible. Bilateral testing is considered an integral part of a complete examination. Photoelectric Plethysmograph (PPG) waveforms and toe systolic pressure readings are included as required and additional duplex testing as needed. Limited examinations for reoccurring indications may be performed as noted.  ABI Findings: +--------+------------------+-----+-------------------+--------+ Right   Rt Pressure (mmHg)IndexWaveform           Comment  +--------+------------------+-----+-------------------+--------+ Brachial120                    triphasic                   +--------+------------------+-----+-------------------+--------+ PTA                            dampened monophasic         +--------+------------------+-----+-------------------+--------+ DP                             dampened monophasic         +--------+------------------+-----+-------------------+--------+ +--------+------------------+-----+--------+-------------+ Left    Lt Pressure (mmHg)IndexWaveformComment       +--------+------------------+-----+--------+-------------+ Brachial                               Not evaluated +--------+------------------+-----+--------+-------------+  Summary: Right: Abnormal right pedal waveforms suggestive of possible severe  arterial insufficiency. Patient unable to tolerate blood pressure cuff, therefore unable to obtain right ABI.  *See table(s) above for measurements and observations.  Electronically signed by Monica Martinez MD on 12/27/2019 at 1:42:16 PM.   Final    VAS [place] LOWER EXTREMITY SAPHENOUS VEIN MAPPING  Result Date: 12/29/2019 LOWER EXTREMITY VEIN MAPPING Indications:  Pre-op Risk Factors: PAD.  Comparison Study: No prior study Performing Technologist: Sharion Dove RVS  Examination Guidelines: A complete evaluation includes B-mode imaging, spectral Doppler, color Doppler, and power Doppler as needed of all accessible portions of each vessel. Bilateral testing is considered an integral part of a complete examination. Limited examinations for reoccurring indications may be performed as noted. +---------------+-----------+----------------------+---------------+-----------+   RT Diameter  RT Findings         GSV            LT Diameter  LT Findings      (cm)                                            (cm)                  +---------------+-----------+----------------------+---------------+-----------+      0.49  Saphenofemoral                                                                Junction                                  +---------------+-----------+----------------------+---------------+-----------+      0.38                     Proximal thigh                               +---------------+-----------+----------------------+---------------+-----------+      0.38       branching       Mid thigh                                  +---------------+-----------+----------------------+---------------+-----------+      0.38                      Distal thigh                                +---------------+-----------+----------------------+---------------+-----------+      0.22                          Knee                                     +---------------+-----------+----------------------+---------------+-----------+      0.26       branching       Prox calf                                  +---------------+-----------+----------------------+---------------+-----------+      0.24       branching        Mid calf                                  +---------------+-----------+----------------------+---------------+-----------+      0.24                      Distal calf                                 +---------------+-----------+----------------------+---------------+-----------+      0.21                         Ankle                                    +---------------+-----------+----------------------+---------------+-----------+ Diagnosing physician: Ruta Hinds MD Electronically signed by Ruta Hinds MD on 12/29/2019 at 1:46:13 PM.  Final     Labs:  Basic Metabolic Panel: Recent Labs  Lab 01/01/20 0519 01/04/20 0614  NA 135 134*  K 4.1 4.2  CL 101 99  CO2 26 25  GLUCOSE 105* 101*  BUN 11 9  CREATININE 0.85 0.80  CALCIUM 8.2* 8.4*    CBC: Recent Labs  Lab 01/01/20 0519 01/04/20 0614  WBC 6.9 8.3  NEUTROABS 4.4 5.6  HGB 8.1* 8.1*  HCT 24.5* 24.6*  MCV 95.7 96.1  PLT 699* 863*    CBG: No results for input(s): GLUCAP in the last 168 hours.  Family history.  Mother with hypertension hyperlipidemia.  Denies diabetes mellitus colon cancer or rectal cancer  Brief HPI:   Terrence Carr is a 59 y.o. right-handed male with history of hypertension tobacco and alcohol abuse as well as vitamin B12 deficiency and peripheral vascular disease with revascularization right iliac artery stenting placement Dr. Radene Knee in March 2020 maintained on Plavix.  Per chart review lives with girlfriend, stepdaughter and her boyfriend.  Independent with assistive device prior to admission.  Presented 12/23/2019 to [facility] with acute onset progressive worsening of wounds bilateral lower extremities x3  weeks left greater than right.  X-rays concerning for osteomyelitis at the base of the first distal phalanx of the right foot as well as left distal fibular metadiaphysis and was started on IV antibiotics.  Patient transferred to [facility] for further evaluation.  Follow-up vascular surgery with profound ischemic gangrenous changes of left lower extremity underwent left AKA 12/24/2019 per Dr. Donnetta Hutching.  Patient with follow-up evaluation of right lower extremity.  ABIs nondetectable.  Underwent right lower extremity angiogram subsequently followed by right common femoral profunda femoral enterectomy, right common femoral to below-knee popliteal artery bypass 12/30/2019 per Dr. Donzetta Matters.  Hospital course pain management.  Subcutaneous heparin for DVT prophylaxis.  Hospital course anemia 6.7 transfuse latest hemoglobin 8.4.  Noted left buttocks pressure ulceration stage II with wound care as directed.  Patient was admitted for a comprehensive rehab program   Hospital Course: Terrence Carr was admitted to rehab 12/31/2019 for inpatient therapies to consist of PT, ST and OT at least three hours five days a week. Past admission physiatrist, therapy team and rehab RN have worked together to provide customized collaborative inpatient rehab.  Pertaining to patient left AKA 12/24/2019 as well as right femoral popliteal bypass 12/30/2019 patient would follow-up with vascular surgery wound care as directed.  Wound care to right lower extremity pretibial full-thickness wound and right foot wound at base of great toe cleansed with normal saline pat dry.  Pain in wound with Betadine swab stick and allow it to air dry.  When dry, apply dry gauze dressing and secure with Kerlix roll gauze paper tape change twice daily and float heel.  In regards to patient's buttocks sacral wound silicone foam to the buttock wound change every 3 days and as needed soilage.  Patient was instructed on pressure relief.  Subcutaneous heparin  for DVT prophylaxis no other bleeding episodes as well as continue on low-dose aspirin.  Neurontin 100 mg 3 times daily as well as oxycodone for breakthrough pain for pain management.  Acute blood loss anemia latest hemoglobin 8.1.  Blood pressure labile with no current antihypertensive medications and would follow-up with primary MD on resuming back anti hypertensive meds as needed..  Lipitor ongoing for hyperlipidemia.  Bouts of constipation resolved with laxative assistance.  Dietary service follow-up for nutritional support.  All issues with regards  to alcohol and tobacco use were discussed with patient and family in regards to cessation of these products it was questionable if he would be compliant with these request.  Blood pressures were monitored on TID basis and monitored  He/ is continent of bowel and bladder.  He/ has made gains during rehab stay and is attending therapies  He/ will continue to receive follow up therapies   after discharge  Rehab course: During patient's stay in rehab weekly team conferences were held to monitor patient's progress, set goals and discuss barriers to discharge. At admission, patient required moderate assist for rolling, moderate assist supine to sit, moderate assist side-lying to sitting, max assist sit to stand.  Set up upper body bathing max is lower body bathing minimal assist upper body dressing total assist lower body dressing max assist toilet transfers  Physical exam. Blood pressure 139/75 pulse 77 temperature 98.1 respirations 13 oxygen saturation 94% room air Constitutional.  No distress patient cachectic HEENT Head.  Normocephalic and atraumatic Mouth/throat.  No oropharyngeal exudate patient poor dentition Eyes.  Pupils round and reactive to light no nystagmus Neck.  Supple nontender no JVD no thyromegaly Cardiovascular regular rate rhythm without any extra sounds or murmur heard Respiratory.  Effort normal no respiratory distress without  wheezes GI.  Soft nontender positive bowel sounds without rebound Musculoskeletal.  Right upper extremity 4+ out of 5 to deltoid, bicep tricep, wrist extension grip and finger abduction. Left upper extremity deltoid 2+ out of 5 otherwise 4+ out of 5 in left upper extremity Right lower extremity at least 3+ out of 5 Left lower extremity hip flexion 3+ out of 5 Skin.  Left AKA with staples in place no drainage no erythema Bypass sites lower extremities dressed with palpable pulses Right shin wound and right top of foot have dressings in place.  He/  has had improvement in activity tolerance, balance, postural control as well as ability to compensate for deficits. He/ has had improvement in functional use RUE/LUE  and RLE/LLE as well as improvement in awareness.  Patient perform supine to sit from sit with supervision and a flat bed and on a mat table and rolling supine prone on mat table supervision for safety.  Patient perform lateral scoot transfers bed to wheelchair wheelchair to mat table wheelchair to car simulator to sedan seat height and wheelchair to drop down bedside commode with contact-guard assist.  Provided verbal cues for hand placement and wheelchair set up.  Patient perform lower body dressing and supervision for pants and minimal assist for briefs and pericare.  Required minimum assist due to loss of balance on bedside commode at times.  Propels his wheelchair with supervision.       Disposition: Discharge to home    Diet: Regular  Special Instructions: No driving smoking or alcohol  Wound care.  Wound care to right lower extremity pretibial full-thickness wound and right foot wound at base of great toe.  Cleanse with normal saline, pat dry.     When dry apply dry gauze dressing and secure with Kerlix roll gauze paper tape change twice daily  Silicone foam to buttock wound change every 3 days and as needed soilage  Follow up with PCP on resuming back blood pressure meds as  needed  Medications at discharge. 1.  Tylenol as needed 2.  Aspirin 81 mg p.o. daily 3.  Lipitor 20 mg p.o. daily 4.  Folic acid 1 mg p.o. daily 5.  Neurontin 100 mg p.o. 3 times  daily 6.  Multivitamin daily 7.Juven powder packet twice daily 8.  Oxycodone 5 to 10 mg every 4 hours as needed pain 9.  Protonix 40 mg p.o. daily 10.  Senokot 1 tablet p.o. twice daily 11.  Vitamin B12 1000 mcg p.o. daily 12.Wellburrin 100 mg BID 13.Naltrexone 50 mg daily  Discharge Instructions    Ambulatory referral to Physical Medicine Rehab   Complete by: As directed    Moderate complexity follow up 1-2 weeks Left AKA     Allergies as of 01/08/2020      Reactions   Ibuprofen Other (See Comments)   Patient said "the little brown pills" that he bought from the store made him talk out of his head (reported by [facility])   Other Other (See Comments)   Unknown reaction to "Dihydroxyalaminum aminoacetate magnesium carbonate" (reported by [facility]      Medication List    STOP taking these medications   amLODipine 5 MG tablet Commonly known as: NORVASC   feeding supplement (ENSURE ENLIVE) Liqd   ferrous sulfate 325 (65 FE) MG tablet   guaiFENesin-dextromethorphan 100-10 MG/5ML syrup Commonly known as: ROBITUSSIN DM   lisinopril 20 MG tablet Commonly known as: ZESTRIL   metoprolol tartrate 25 MG tablet Commonly known as: LOPRESSOR   thiamine 100 MG tablet   vancomycin 750 MG/150ML Soln Commonly known as: VANCOREADY     TAKE these medications   acetaminophen 325 MG tablet Commonly known as: TYLENOL Take 1-2 tablets (325-650 mg total) by mouth every 4 (four) hours as needed for mild pain (or temp >/= 101 F).   aspirin 81 MG EC tablet Take 1 tablet (81 mg total) by mouth daily.   atorvastatin 20 MG tablet Commonly known as: LIPITOR Take 1 tablet (20 mg total) by mouth daily at 6 PM.   bisacodyl 5 MG EC tablet Commonly known as: DULCOLAX Take 1 tablet (5 mg  total) by mouth daily as needed for moderate constipation.   buPROPion 100 MG 12 hr tablet Commonly known as: WELLBUTRIN SR Take 1 tablet (100 mg total) by mouth 2 (two) times daily.   cyanocobalamin 1000 MCG tablet Take 1 tablet (1,000 mcg total) by mouth daily.   docusate sodium 100 MG capsule Commonly known as: COLACE Take 1 capsule (100 mg total) by mouth 2 (two) times daily.   folic acid 1 MG tablet Commonly known as: FOLVITE Take 1 tablet (1 mg total) by mouth daily.   gabapentin 100 MG capsule Commonly known as: NEURONTIN Take 1 capsule (100 mg total) by mouth 3 (three) times daily. What changed:   medication strength  how much to take   naltrexone 50 MG tablet Commonly known as: DEPADE Take 50 mg by mouth daily.   oxyCODONE 5 MG immediate release tablet Commonly known as: Oxy IR/ROXICODONE Take 1-2 tablets (5-10 mg total) by mouth every 4 (four) hours as needed for moderate pain.   pantoprazole 40 MG tablet Commonly known as: PROTONIX Take 1 tablet (40 mg total) by mouth daily.   polyethylene glycol 17 g packet Commonly known as: MIRALAX / GLYCOLAX Take 17 g by mouth daily as needed for mild constipation.   Thera-M Tabs Take 1 tablet by mouth daily.      Follow-up Information    Lovorn, Jinny Blossom, MD Follow up.   Specialty: Physical Medicine and Rehabilitation Why: Office to call for appointment Contact information: [id number] [place] [address] [phone]  Waynetta Sandy, MD Follow up.   Specialties: Vascular Surgery, Cardiology Why: Call for appointment Contact information: [place] [address] [phone]        Rosetta Posner, MD Follow up.   Specialties: Vascular Surgery, Cardiology Why: Call for appointment Contact information: [address] [place] [address] [phone]           Signed: Lavon Paganini [place] 01/08/2020, 5:09 AM

## 2020-01-06 NOTE — Progress Notes (Signed)
Physical Therapy Session Note  Patient Details  Name: Terrence Carr MRN: [id number] Date of Birth: 1961-11-23  Today's Date: 01/06/2020 PT Individual Time: 0905-1005 PT Individual Time Calculation (min): 60 min   Short Term Goals: Week 1:  PT Short Term Goal 1 (Week 1): STG=LTG due to ELOS  Skilled Therapeutic Interventions/Progress Updates:     Patient in bed upon PT arrival. Patient alert and agreeable to PT session. Patient reported moderate R LE pain during session, RN made aware and provided Tylenol during session. PT provided repositioning, rest breaks, and distraction as pain interventions throughout session.   Therapeutic Activity: Bed Mobility: Patient performed supine to/from sit with supervision in a flat bed and on a mat table and rolling supine<>prone on a mat table with supervision for safety/cues. Provided verbal cues for rolling to side then pushing to his elbow then hand to sit up, and protecting his residual limb when rolling to/form prone. Transfers: Patient performed lateral scoot transfers bed>w/c, w/c<>mat table, w/c<>car simulator set to sedan seat height, and w/c<>drop down BSC with CGA-supervision. Provided verbal cues for hand placement and w/c set up for all transfers. Patient performed LB dressing with supervision for pants and min A for incontinence brief and peri-care with supervision when toileting. Required min A due to LOB on BSC x1 during lateral leans.  Wheelchair Mobility:  Patient propelled wheelchair ~185 feet, 135 feet, and 155 feet with supervision. Provided verbal cues for management of R leg rest and breaks throughout session. Required max A for donning the leg rest with cues.   Therapeutic Exercise: Patient performed the following exercises with verbal and tactile cues for proper technique. -lying in prone with patient resting on elbows for hip flexor stretch x5 min (educated on importance of maintaining ROM and residual limb care during  stretch) -L Prone hip extension 2x10  Patient in w/c in room at end of session with breaks locked, seat belt alarm set, and all needs within reach.    Therapy Documentation Precautions:  Precautions Precautions: Fall Precaution Comments: Lt AKA, wounds R LE and Lt buttocks Restrictions Weight Bearing Restrictions: No RLE Weight Bearing: Weight bearing as tolerated LLE Weight Bearing: Non weight bearing    Therapy/Group: Individual Therapy  Donzel Romack L [place] Perra PT, DPT  01/06/2020, 3:55 PM

## 2020-01-06 NOTE — Progress Notes (Signed)
Physical Therapy Session Note  Patient Details  Name: Terrence Carr MRN: [id number] Date of Birth: [birth year]  Today's Date: 01/06/2020 PT Individual Time: 1450-1544 PT Individual Time Calculation (min): 54 min   Short Term Goals: Week 1:  PT Short Term Goal 1 (Week 1): STG=LTG due to ELOS  Skilled Therapeutic Interventions/Progress Updates:    Pt seated in w/c upon PT arrival, agreeable to therapy tx and denies pain. Pt propelled w/c x 120 ft from his room>ortho gym using B UE s with supervision working on UE strength and endurance. Pt used UE ergometer this session x 8 minutes on resistance level 5 for continued strengthening and endurance. Pt declines practicing car transfer this afternoon. Pt propelled gym x 100 ft to the gym, requesting to work on more UE strengthening. Pt performed the following UE exercises for strengthening, cues for techniques and 2 x 10 each: R shoulder press 3#, L shoulder flexion active assist, B bicep curls 3#, L shoulder active assisted shoulder abduction, UE wheelchair pushups. Pt propelled to parallel bars in preparation to possibly perform sit<>stand, put mat under pt's foot to decrease pressure on pt's foot, decided to defer activity secondary to continued R LE pain and wounds. Performed seated LAQ 2 x 10 for strengthening. Pt propelled w/c back to the room using B UEs, x 200 ft with supervision. Pt left seated in w/c at end of session with needs in reach and chair alarm set.   Therapy Documentation Precautions:  Precautions Precautions: Fall Precaution Comments: Lt AKA, wounds R LE and Lt buttocks Restrictions Weight Bearing Restrictions: No RLE Weight Bearing: Weight bearing as tolerated LLE Weight Bearing: Non weight bearing    Therapy/Group: Individual Therapy  Netta Corrigan, PT, DPT, CSRS 01/06/2020, 2:50 PM

## 2020-01-07 ENCOUNTER — Inpatient Hospital Stay (HOSPITAL_COMMUNITY): Payer: Medicaid Other

## 2020-01-07 ENCOUNTER — Inpatient Hospital Stay (HOSPITAL_COMMUNITY): Payer: Medicaid Other | Admitting: Occupational Therapy

## 2020-01-07 MED ORDER — FOLIC ACID 1 MG PO TABS: 1.0000 mg | ORAL_TABLET | Freq: Every day | ORAL | 0 refills | Status: DC

## 2020-01-07 MED ORDER — ATORVASTATIN CALCIUM 20 MG PO TABS: 20.0000 mg | ORAL_TABLET | Freq: Every day | ORAL | 1 refills | Status: DC

## 2020-01-07 MED ORDER — GABAPENTIN 100 MG PO CAPS: 100.0000 mg | ORAL_CAPSULE | Freq: Three times a day (TID) | ORAL | 1 refills | Status: DC

## 2020-01-07 MED ORDER — BISACODYL 5 MG PO TBEC: 5.0000 mg | DELAYED_RELEASE_TABLET | Freq: Every day | ORAL | 0 refills | Status: DC | PRN

## 2020-01-07 MED ORDER — BUPROPION HCL ER (SR) 100 MG PO TB12: 100.0000 mg | ORAL_TABLET | Freq: Two times a day (BID) | ORAL | 1 refills | Status: DC

## 2020-01-07 MED ORDER — CYANOCOBALAMIN 1000 MCG PO TABS: 1000.0000 ug | ORAL_TABLET | Freq: Every day | ORAL | 1 refills | Status: DC

## 2020-01-07 MED ORDER — OXYCODONE HCL 5 MG PO TABS: 5.0000 mg | ORAL_TABLET | ORAL | 0 refills | Status: DC | PRN

## 2020-01-07 MED ORDER — PANTOPRAZOLE SODIUM 40 MG PO TBEC: 40.0000 mg | DELAYED_RELEASE_TABLET | Freq: Every day | ORAL | 0 refills | Status: DC

## 2020-01-07 NOTE — Progress Notes (Signed)
Occupational Therapy Session Note  Patient Details  Name: Terrence Carr MRN: [id number] Date of Birth: 04-02-1961  Today's Date: 01/07/2020  Skilled Therapeutic Interventions/Progress Updates:    attempted again at 14:00 to see patient for therapy session.  He continues to refuse all therapeutic activities as offered.    Therapy Documentation Precautions:  Precautions Precautions: Fall Precaution Comments: Lt AKA, wounds R LE and Lt buttocks Restrictions Weight Bearing Restrictions: Yes RLE Weight Bearing: Weight bearing as tolerated LLE Weight Bearing: Non weight bearing  [name] [name] 01/07/2020, 2:36 PM

## 2020-01-07 NOTE — Progress Notes (Signed)
Occupational Therapy Daily Note and Discharge Summary  Patient Details  Name: Terrence Carr MRN: [id number] Date of Birth: April 22, 1961   10:00-11:00 Daughter not present at beginning of session. Pt had been incontinent of stool in bed and reported needing to void some more. With [facility] setup for pt, pt able to transfer to drop arm BSC with supervision with extra time. Pt continues to not want to put a lot of weight through his right foot. Pt able to void more. Pt able to perform hygiene with setup of soapy washcloths. Pt wanted to don brief and paper pants with min A to pull up with lateral leans on BSC. Pt perform scoot/ squat pivot over to new w/c with supervision after setup of equipment. Pt able to self propel around the room with supervision as well as don leg rests. Daughter arrived; pt declined performing any more transfers in session. Demonstrated proper setup of w/c and BSC and w/c with furniture and how pt performs transfers. Discussed right foot care and importance of checking skin and changing bandages. Discussed recommendations for 24 hr supervision with all tasks and recommendations for bedside bathing or sink side (w/c will not go into bathroom). Pt does not perform sit to stands. Provided exercise handouts, handout on limb loss wrapping (pt declined to demonstrate for daughter at this time) and provided First Step resources from [employer].  . (JLS)    Patient has met 10 of 10 long term goals due to improved activity tolerance, improved balance, postural control and improved coordination.  Patient to discharge at [facility] Assist level.    Patient's care partner is independent to provide the necessary physical assistance at discharge.    Reasons goals not met: na  Recommendation:  Patient will benefit from ongoing skilled OT services in home health setting to continue to advance functional skills in the area of BADL, iADL and Reduce care partner burden.  Equipment: drop  arm commode and w/c  Reasons for discharge: treatment goals met  Patient/family agrees with progress made and goals achieved: Yes  OT Discharge Precautions/Restrictions  Precautions Precautions: Fall Precaution Comments: Lt AKA, wounds R LE and Lt buttocks Restrictions Weight Bearing Restrictions: Yes RLE Weight Bearing: Weight bearing as tolerated LLE Weight Bearing: Non weight bearing   Vital Signs Therapy Vitals Temp: 98.2 F (36.8 C) Temp Source: Oral Pulse Rate: 68 BP: (!) 128/53 Oxygen Therapy SpO2: 100 % Pain Pain Assessment Pain Scale: 0-10 Pain Score: 0-No pain Pain Type: Acute pain Pain Location: Teeth Pain Descriptors / Indicators: Aching Pain Frequency: Occasional Pain Onset: On-going Patients Stated Pain Goal: 3 Pain Intervention(s): Medication (See eMAR) ADL ADL Eating: Independent Where Assessed-Eating: Wheelchair Grooming: Setup Where Assessed-Grooming: Wheelchair Upper Body Bathing: Setup Where Assessed-Upper Body Bathing: Wheelchair Lower Body Bathing: Minimal assistance Where Assessed-Lower Body Bathing: Wheelchair Upper Body Dressing: Setup Where Assessed-Upper Body Dressing: Wheelchair Lower Body Dressing: Minimal assistance Where Assessed-Lower Body Dressing: Bed level Toileting: Minimal assistance Where Assessed-Toileting: Bedside Commode Toilet Transfer: Close supervision Toilet Transfer Method: Engineer, water: Drop arm bedside commode Tub/Shower Transfer: Not assessed ADL Comments: recommend sponge bath in home environment Vision Baseline Vision/History: No visual deficits Patient Visual Report: No change from baseline   Cognition Overall Cognitive Status: Within Functional Limits for tasks assessed Arousal/Alertness: Awake/alert Orientation Level: Oriented X4 Sensation Sensation Additional Comments: UB intact Motor  Motor Motor - Discharge Observations: good seated balance, limited attempts to put  weight through right LE due to pain -  completing sit pivot lateral transfers Mobility  Bed Mobility Rolling Right: Independent Rolling Left: Independent Supine to Sit: Supervision/Verbal cueing Sit to Supine: Supervision/Verbal cueing     Balance Static Sitting Balance Static Sitting - Level of Assistance: 7: Independent Dynamic Sitting Balance Dynamic Sitting - Level of Assistance: 6: Modified independent (Device/Increase time) Extremity/Trunk Assessment RUE Assessment RUE Assessment: Within Functional Limits LUE Assessment Active Range of Motion (AROM) Comments: shoulder to 60 for flex/abd - able to sustain for closed chain activity, distal WFL   Stacey A Kinter 01/07/2020, 5:03 PM

## 2020-01-07 NOTE — Progress Notes (Signed)
Physical Therapy Session Note  Patient Details  Name: Terrence Carr MRN: [id number] Date of Birth: [birth year]  Today's Date: 01/07/2020 PT Individual Time: [id number] PT Individual Time Calculation (min): 35 min   Short Term Goals: Week 1:  PT Short Term Goal 1 (Week 1): STG=LTG due to ELOS  Skilled Therapeutic Interventions/Progress Updates:     Patient in bed on the phone upon PT arrival. Patient alert and agreeable to PT session if therapist provided him with coffee and have the nurse change his sacral dressing. PT provided the patient with coffee and informed RN about patient's request for dressing change. PT wrapped the patient's L residual limb with 2-4" ACE wraps, provided education on wrapping technique, schedule, and purpose. Patient receptive to education. Measured patient's L LE hip ROM: flexion 93 degrees, extension -3 degrees in supine. Patient performed the following therapeutic exercises for LE strengthening from HEP handout provided by PT with cues for proper technique 1x10 each: L side-lying hip flexion/extension, L side-lying abduction, L prone hip extension, and L hip adduction in supine with 5 sec hold. Patient performed rolling independently in the bed and RN changed dressing with patient in side-lying during session. Patient in bed with RN and handed off to Alba, OT at end of session.   Therapy Documentation Precautions:  Precautions Precautions: Fall Precaution Comments: Lt AKA, wounds R LE and Lt buttocks Restrictions Weight Bearing Restrictions: Yes RLE Weight Bearing: Weight bearing as tolerated LLE Weight Bearing: Non weight bearing General: PT Amount of Missed Time (min): 10 Minutes PT Missed Treatment Reason: Nursing care(dressing change)    Therapy/Group: Individual Therapy  Fraser Busche L Delana Manganello PT, DPT  01/07/2020, 6:45 PM

## 2020-01-07 NOTE — Plan of Care (Signed)
  Problem: Consults Goal: Skin Care Protocol Initiated - if Braden Score 18 or less Description: If consults are not indicated, leave blank or document N/A Outcome: Progressing Goal: Nutrition Consult-if indicated Outcome: Progressing   Problem: RH BOWEL ELIMINATION Goal: RH STG MANAGE BOWEL WITH ASSISTANCE Description: STG Manage Bowel with [place]. Outcome: Progressing   Problem: RH SKIN INTEGRITY Goal: RH STG SKIN FREE OF INFECTION/BREAKDOWN Outcome: Progressing Goal: RH STG MAINTAIN SKIN INTEGRITY WITH ASSISTANCE Description: STG Maintain Skin Integrity With  Mod Assistance. Outcome: Progressing Goal: RH STG ABLE TO PERFORM INCISION/WOUND CARE W/ASSISTANCE Description: STG Able To Perform Incision/Wound Care With  Mod Assistance. Outcome: Progressing   Problem: Consults Goal: RH LIMB LOSS PATIENT EDUCATION Description: Description: See Patient Education module for eduction specifics. Outcome: Progressing

## 2020-01-07 NOTE — Progress Notes (Signed)
Social Work Assessment and Plan   Patient Details  Name: Terrence Carr MRN: [id number] Date of Birth: 1961/11/12  Today's Date: 01/04/2020  Problem List:  Patient Active Problem List   Diagnosis Date Noted  . Pressure ulcer of left buttock, stage 2 (Terrence Carr) 01/01/2020  . Left above-knee amputee (Terrence Carr) 12/31/2019  . S/P AKA (above knee amputation) unilateral, left (Terrence Carr)   . Osteomyelitis of multiple sites (Terrence Carr)   . Thrombocytosis (Terrence Carr) 12/24/2019  . Hypoglycemia 12/24/2019  . Osteomyelitis of ankle or foot, acute, right (Terrence Carr) 12/23/2019  . Lactic acidosis 12/23/2019  . Alcohol abuse 12/23/2019  . Alcohol withdrawal (Terrence Carr) 12/23/2019  . Macrocytic anemia 12/23/2019  . Sacral decubitus ulcer 12/23/2019  . Hyperglycemia 12/23/2019  . Essential hypertension 12/23/2019  . Peripheral arterial disease (Terrence Carr) 12/23/2019  . Vitamin B12 deficiency 12/23/2019   Past Medical History:  Past Medical History:  Diagnosis Date  . Hypertension    Past Surgical History:  Past Surgical History:  Procedure Laterality Date  . AMPUTATION Left 12/24/2019   Procedure: LEFT ABOVE KNEE AMPUTATION;  Surgeon: Terrence Posner, MD;  Location: [facility];  Service: Vascular;  Laterality: Left;  . FEMORAL-POPLITEAL BYPASS GRAFT Right 12/30/2019   Procedure: RIGHT FEMORAL-POPLITEAL ARTERY BYPASS GRAFT;  Surgeon: Terrence Sandy, MD;  Location: [facility];  Service: Vascular;  Laterality: Right;  . LOWER EXTREMITY ANGIOGRAPHY Right 12/28/2019   Procedure: LOWER EXTREMITY ANGIOGRAPHY;  Surgeon: Terrence Sandy, MD;  Location: [facility];  Service: Cardiovascular;  Laterality: Right;   Social History:  reports that he has never smoked. He has never used smokeless tobacco. He reports that he does not drink alcohol or use drugs.  Family / Support Systems Marital Status: (fiance) Patient Roles: Partner Spouse/Significant Other: girlfriend, Terrence Carr, in the home but with health issues - not to be a  caregiver Children: step-daughter, Terrence Carr @ [phone] Other Supports: step-granddaughter, Terrence Carr @ [phone];  pt's sister Anticipated Caregiver: Terrence Carr and Terrence Carr Ability/Limitations of Caregiver: Terrence Carr works, Terrence Carr is there when Terrence Carr is working to care for Terrence Carr and patient Caregiver Availability: 24/7 Family Dynamics: Pt reports that he has "lots of people" to assist and insists he is ready to go home.  Social History Preferred language: English Religion: Non-Denominational Cultural Background: NA Read: Yes Write: Yes Employment Status: Disabled Public relations account executive Issues: [employer] Guardian/Conservator: None - per MD, pt is capable of making decisions on his own behalf   Abuse/Neglect Abuse/Neglect Assessment Can Be Completed: Yes Physical Abuse: Denies Verbal Abuse: Denies Sexual Abuse: Denies Exploitation of patient/patient's resources: Denies Self-Neglect: Denies  Emotional Status Pt's affect, behavior and adjustment status: Pt is pleasant and completes assessment interview without much difficulty, however, mostly focused on wanting to make his Carr about readiness for d/c.  He denies any significant emotional distress. Recent Psychosocial Issues: None Psychiatric History: None Substance Abuse History: none  Patient / Family Perceptions, Expectations & Goals Pt/Family understanding of illness & functional limitations: Pt and step-daughter with general understanding of his medical issues and current functional limitations/ need for CIR. Premorbid pt/family roles/activities: Pt was independent overall Anticipated changes in roles/activities/participation: Step-daughter and granddaughter will need to provide more physical assistance. Pt/family expectations/goals: "I just need to get home.  I can do what their doing here on my own."  [employer]: None Premorbid Home Care/DME Agencies: None Transportation available  at discharge: yes  Discharge Planning Living Arrangements: Spouse/significant other, Children, Other relatives Support Systems: Children Type of Residence: Private  residence Insurance Resources: Medicaid (specify county)([place]) Financial Resources: SSI Financial Screen Referred: No Money Management: Patient Does the patient have any problems obtaining your medications?: No Home Management: pt and step-daughter/ grand Patient/Family Preliminary Plans: Pt to return home with step-daughter and others to provide needed assistance. Social Work Anticipated Follow Up Needs: HH/OP Expected length of stay: 7-10 days  Clinical Impression Pleasant gentleman here following AKA and focused on d/c home ASAP.  He reports Carr, 24/7 support in the home.  Denies any emotional distress.  Will follow for support and d/c planning needs.  Terrence Carr 01/04/2020, 9:39 AM

## 2020-01-07 NOTE — Progress Notes (Signed)
Physical Therapy Session Note  Patient Details  Name: Terrence Carr MRN: [id number] Date of Birth: [birth year]  Today's Date: 01/07/2020 PT Individual Time: 0907-1002 PT Individual Time Calculation (min): 55 min   Short Term Goals: Week 1:  PT Short Term Goal 1 (Week 1): STG=LTG due to ELOS Week 2:    Week 3:     Skilled Therapeutic Interventions/Progress Updates:      Therapy Documentation Precautions:  Precautions Precautions: Fall Precaution Comments: Lt AKA, wounds R LE and Lt buttocks Restrictions Weight Bearing Restrictions: Yes RLE Weight Bearing: Weight bearing as tolerated LLE Weight Bearing: Non weight bearing PAIN Pt denies pain this pm  Pt initially supine and requesting assist w/dressing.  Pt rolls mod I for therapist to donn brief and pants.  Pt able to thread RLE while therapist manages pants leg carefully as pt concerned this may cause pain.  Supine to sit on edge of bed w/supervision and use of rails.  Bed to wc w setup and supervision - pt resistant to therapists attempts to guard or assist but did transfer safely.   wc propulsion on unit greater than 620ft including tight turns, backing w/supervision.  Discussed efficient stroke for both efficiency and protection of shoulders.  Pt provided w/clean paper shirt and able to doff /donn shirt independenly in wc.    Social worker in to [employer] equipment needs.  Discussed wc and clarified that no amputee support pad needed due to length of residual limb/fully supported on cusion.  Also recommended no walker at this time due to wbing restrictions/wounds.  Ramp- pt able to propel up/down ramp w/single cue for wt shifting w/ascension.    UE strengthening:  Manual resistance L shoulder flexion/extension, IR/ER, and press/pull 2 x10 each. UBE x 10 min L5 w/cues for increased pace performed for UE strengthening and cardiovascular endurance, RPE 5/10.   wc propulsion mod I x 267ft w/cues for efficiency.  Pt  declined return to bed stating "I will go where I want to when I want to."   Pt left OOB in wc w/all needs in reach.    Therapy/Group: Individual Therapy  Callie Fielding, [place] 01/07/2020, 12:34 PM

## 2020-01-07 NOTE — Progress Notes (Signed)
Physical Therapy Discharge Summary  Patient Details  Name: Terrence Carr MRN: [id number] Date of Birth: Jan 17, 1961  Today's Date: 01/08/2020 PT Individual Time: 1100-1120 PT Individual Time Calculation (min): 20 min    Patient has met 4 of 4 long term goals due to improved activity tolerance, improved balance, improved postural control, increased strength, increased range of motion, decreased pain and ability to compensate for deficits.  Patient to discharge at a wheelchair level Supervision.   Patient's care partner is independent to provide the necessary physical assistance at discharge.  Final family training session involving car transfer was limited by pt lack of cooperation.  He demonstrated poor technique and safety awareness w/this, daughter was able to assist pt following training w/verbal cues from therapist.  Reasons goals not met: Discharged standing goals during stay due to non-healing R LE wounds and R LE pain with weight bearing.   Recommendation:  Patient will benefit from ongoing skilled PT services in home health setting to continue to advance safe functional mobility, address ongoing impairments in balance, functional mobility, strength/ROM, patient/caregiver education, and minimize fall risk.  Equipment: 16"x16" light weight wheelchair  Reasons for discharge: treatment goals met  Patient/family agrees with progress made and goals achieved: Yes  PT Discharge Precautions/Restrictions Precautions Precautions: Fall Precaution Comments: Lt AKA, wounds R LE and Lt buttocks Restrictions Weight Bearing Restrictions: Yes RLE Weight Bearing: Weight bearing as tolerated LLE Weight Bearing: Non weight bearing Vision/Perception  Perception Perception: Within Functional Limits Praxis Praxis: Intact  Cognition Overall Cognitive Status: Within Functional Limits for tasks assessed Arousal/Alertness: Awake/alert Orientation Level: Oriented X4 Sustained Attention: Appears  intact Memory: Appears intact Awareness: Appears intact Problem Solving: Appears intact Safety/Judgment: Appears intact Sensation Sensation Light Touch: Appears Intact Peripheral sensation comments: Limited assessment of R LE due to R LE pain Additional Comments: UB intact Coordination Gross Motor Movements are Fluid and Coordinated: No Coordination and Movement Description: R LE wounds and sacral wound affecting moiblity, new L AKA Motor  Motor Motor: Other (comment) Motor - Discharge Observations: good seated balance, limited attempts to put weight through right LE due to pain - completing sit pivot lateral transfers  Mobility Bed Mobility Bed Mobility: Rolling Right;Rolling Left;Supine to Sit;Sit to Supine Rolling Right: Independent Rolling Left: Independent Supine to Sit: Supervision/Verbal cueing Sit to Supine: Supervision/Verbal cueing Transfers Transfers: Squat Pivot Transfers Squat Pivot Transfers: Supervision/Verbal cueing Locomotion  Gait Ambulation: No Gait Gait: No Stairs / Additional Locomotion Stairs: No Ramp: Supervision/Verbal cueing(using w/c) Wheelchair Mobility Wheelchair Mobility: Yes Wheelchair Assistance: Chartered loss adjuster: Both upper extremities Wheelchair Parts Management: Needs assistance Distance: >400 feet  Trunk/Postural Assessment  Cervical Assessment Cervical Assessment: Within Functional Limits Thoracic Assessment Thoracic Assessment: Exceptions to [facility] Lumbar Assessment Lumbar Assessment: Within Functional Limits Postural Control Postural Control: Deficits on evaluation Protective Responses: delayed.  Balance Balance Balance Assessed: Yes Static Sitting Balance Static Sitting - Balance Support: No upper extremity supported;Feet unsupported Static Sitting - Level of Assistance: 7: Independent Dynamic Sitting Balance Dynamic Sitting - Balance Support: No upper extremity supported;Feet  unsupported;During functional activity Dynamic Sitting - Level of Assistance: 6: Modified independent (Device/Increase time) Dynamic Sitting - Balance Activities: Lateral lean/weight shifting;Forward lean/weight shifting;Reaching for objects Extremity Assessment  RUE Assessment RUE Assessment: Within Functional Limits LUE Assessment Active Range of Motion (AROM) Comments: shoulder to 60 for flex/abd - able to sustain for closed chain activity, distal WFL RLE Assessment RLE Assessment: Exceptions to [facility] Active Range of Motion (AROM) Comments: Limited knee and hip flexion  due to pain, pt declines formal testing General Strength Comments: grossly 3+/5 due to pain. pt declines formal testing. LLE Assessment LLE Assessment: Exceptions to [facility] Active Range of Motion (AROM) Comments: Hip flexion 93 degrees, hip extension lacking 3 degrees in supine General Strength Comments: grossly at least 3+/5 in hip noted through movement in bed, patient declines formal testing    Cherie L Grunenberg PT, DPT  01/07/2020, 7:02 PM

## 2020-01-07 NOTE — Progress Notes (Signed)
[facility] PHYSICAL MEDICINE & REHABILITATION PROGRESS NOTE   Subjective/Complaints:  Pt reports didn't sleep well- was up staring at wall and talking to brother oh phone all night- explained he HAS to do therapy today since missed so much and supposed to go home tomorrow.  Pt agreed.  Even went over chart to explain starts OT at 9am  ROS- pt denies SOB, CP, Abd pain, N/V/D, (+) constipation, visual changes,  Objective:   No results found. No results for input(s): WBC, HGB, HCT, PLT in the last 72 hours. No results for input(s): NA, K, CL, CO2, GLUCOSE, BUN, CREATININE, CALCIUM in the last 72 hours.  Intake/Output Summary (Last 24 hours) at 01/07/2020 0852 Last data filed at 01/07/2020 0653 Gross per 24 hour  Intake 960 ml  Output 2500 ml  Net -1540 ml     Physical Exam: Vital Signs Blood pressure (!) 117/49, pulse 60, temperature 98.2 F (36.8 C), resp. rate 17, height 5\' 5"  (1.651 m), weight 60.3 kg, SpO2 100 %.  .  Physical Exam  Nursing note and vitals reviewed.  Constitutional: No distress.  Cachetic but looks better overall, better color/fuller face; sitting up in bed, NAD HENT:  Head: Normocephalic and atraumatic.  Mouth/Throat: No oropharyngeal exudate.  Patient with poor dentition -  Eyes: EOMs intact Neck: No tracheal deviation present.  Cardiovascular:  RRR  Respiratory:  CTA B/L, no W/R/R  GI:  Soft, NT, ND, (+) BS Musculoskeletal:  Cervical back: Normal range of motion and neck supple.  Comments: RUE 4+/5- deltoid, biceps, triceps, WE, grip, finger abd LUE- deltoid 2+/5, otherwise 4+/5 in LUE RLE- at least 3+/5- wouldn't push secondary to "wounds" LLE- HF 3+/5- L AKA Neurological:. Sensation intact on L AKA Sensation to LT decreased below knee on RLE  Skin: No rash noted. He is not diaphoretic. No erythema. R groin incision appears well healed but has swelling, nontender to palpation.  Left AKA with staples in place no drainage, no erythema- looks  great- (+) dog ears. Bypass sites lower extremities dressed with palpable pulses R shin wound and R top of foot have dressing- wounds bleeding through- Buttocks- on R buttock- multiple areas slightly open- like previous pimples or moisture? Sacrum slightly open- appears due to moisture- <1cm x 1cm; L buttock has pressure ulcer - superficially open- pink, with a little slough in middle- ~ 5-7 cm Dry skin along fem-pop bypass on RLE Psychiatric:  More aware today   Assessment/Plan: 1. Functional deficits secondary to L AKA and fempop bypass which require 3+ hours per day of interdisciplinary therapy in a comprehensive inpatient rehab setting.  Physiatrist is providing close team supervision and 24 hour management of active medical problems listed below.  Physiatrist and rehab team continue to assess barriers to discharge/monitor patient progress toward functional and medical goals  Care Tool:  Bathing    Body parts bathed by patient: Right arm, Left arm, Chest, Abdomen, Face     Body parts n/a: Front perineal area, Buttocks, Right upper leg, Left upper leg, Right lower leg, Left lower leg   Bathing assist Assist Level: Supervision/Verbal cueing     Upper Body Dressing/Undressing Upper body dressing   What is the patient wearing?: Pull over shirt    Upper body assist Assist Level: Set up assist    Lower Body Dressing/Undressing Lower body dressing    Lower body dressing activity did not occur: Refused What is the patient wearing?: Pants     Lower body assist Assist for  lower body dressing: Minimal Assistance - Patient > 75%     Toileting Toileting Toileting Activity did not occur Landscape architect and hygiene only): Refused  Toileting assist Assist for toileting: Maximal Assistance - Patient 25 - 49%     Transfers Chair/bed transfer  Transfers assist     Chair/bed transfer assist level: Supervision/Verbal cueing     Locomotion Ambulation   Ambulation  assist   Ambulation activity did not occur: Safety/medical concerns          Walk 10 feet activity   Assist  Walk 10 feet activity did not occur: Safety/medical concerns        Walk 50 feet activity   Assist Walk 50 feet with 2 turns activity did not occur: Safety/medical concerns         Walk 150 feet activity   Assist Walk 150 feet activity did not occur: Safety/medical concerns         Walk 10 feet on uneven surface  activity   Assist Walk 10 feet on uneven surfaces activity did not occur: Safety/medical concerns         Wheelchair     Assist Will patient use wheelchair at discharge?: Yes Type of Wheelchair: Manual    Wheelchair assist level: Supervision/Verbal cueing Max wheelchair distance: 200 ft    Wheelchair 50 feet with 2 turns activity    Assist        Assist Level: Supervision/Verbal cueing   Wheelchair 150 feet activity     Assist      Assist Level: Supervision/Verbal cueing   Blood pressure (!) 117/49, pulse 60, temperature 98.2 F (36.8 C), resp. rate 17, height 5\' 5"  (1.651 m), weight 60.3 kg, SpO2 100 %.  Medical Problem List and Plan:  1. Decreased functional ability secondary to peripheral vascular disease status post left AKA 12/24/2019 as well as right femoral-popliteal bypass 12/30/2019  -patient may shower if L AKA is covered  -ELOS/Goals: Mod I to min assist- 10-14 days  2. Antithrombotics:  -DVT/anticoagulation: Subcutaneous heparin  -antiplatelet therapy: Aspirin 81 mg daily  3. Pain Management: Neurontin 100 mg 3 times daily, oxycodone as needed  4. Mood: Provide emotional support  -antipsychotic agents: N/A  5. Neuropsych: This patient is capable of making decisions on his own behalf.  6. Skin/Wound Care- wounds on RLE, groin from R fempop bypass and Stage II on L buttocks: Routine skin checks/sacral foam prophylactic dressing change every third day- needs dressing changes for RLE as well.  1/1-  L buttock pressure ulcer, sacral/coccyx looks like moisture- <1x1cm- few spots on R buttock- doesn't appear to be pressure- will consult WOC   1/4: Has swelling at right groin incision site. Nontender to palpation. Continue to monitor.  7. Fluids/Electrolytes/Nutrition: Routine in and outs with follow-up chemistries   1/1- good results in BMP today 8. Acute blood loss anemia. Follow-up CBC  -last CBC showed Hb of 8.4- down from 11 after surgery- will monitor   1/4:  Hgb stable at 8.1 9. Hypertension. Monitor with increased mobility   1/6- stable- con't regimen 10. Hyperlipidemia. Lipitor  11. History of tobacco alcohol use- at least 1/2ppd- . Provide counseling.  12. Vitamin B12 deficiency. Continue supplement  13. Constipation. Laxative assistance  1/2- will stop Colace and make prn as well as Miralax since having so many stools- might need to increase again, but for now, will make prn.   1/4: Was able to have small hard BM with soap suds enema yesterday. Requests  again today, ordered. Patient denies abdominal pain currently, but nurse reports he has has complained of pain.  1/5- Will order Mg citrate then soap suds enema based on KUB. Will add Senokot 1 BID  1/6- Good BMs- cleaned out yesterday afternoon  1/7- no BM since 1/5- will need prns today again.  14. Alcohol abuse- pt finally admitted since retirement was drinking "1/2 pint every day of vodka"- monitor for withdrawal- likely drinking more.  15. Severe protein malnutrition- BMI is 20- but albumin is 1.6- which is significant and total protein is 5.7- will con't protein supplements, and if loses more weight, will consult nutrition.  16. L shoulder weakness/discomfort- will d/w OT in how it relates to function.  88. Ostemyelitis? - Pt was on Vanc- doesn't need Vanc anymore per vascular physicians since had L AKA.     LOS: 7 days A FACE TO FACE EVALUATION WAS PERFORMED  Locklan Canoy 01/07/2020, 8:52 AM

## 2020-01-07 NOTE — Progress Notes (Signed)
Occupational Therapy Session Note  Patient Details  Name: Terrence Carr MRN: [id number] Date of Birth: May 26, 1961  Today's Date: 01/07/2020 OT Individual Time: 1540-1555 OT Individual Time Calculation (min): 15 min    Short Term Goals: Week 1:  OT Short Term Goal 1 (Week 1): STGs=LTGs due to ELOS  Skilled Therapeutic Interventions/Progress Updates:    Patient agrees to short session this afternoon.  Reviewed set up and use of drop arm commode - he demonstrates his ability to adjust arm rest.  Bed mobility at supervision level.  Reviewed adl and set up needs in home environment.  Patient declines further activity.  He remained in bed at close of session, bed alarm set and call bell in hand  Therapy Documentation Precautions:  Precautions Precautions: Fall Precaution Comments: Lt AKA, wounds R LE and Lt buttocks Restrictions Weight Bearing Restrictions: Yes RLE Weight Bearing: Weight bearing as tolerated LLE Weight Bearing: Non weight bearing General:   Vital Signs: Therapy Vitals Temp: 98.2 F (36.8 C) Temp Source: Oral Pulse Rate: 68 BP: (!) 128/53 Oxygen Therapy SpO2: 100 % Pain: Pain Assessment Pain Scale: 0-10 Pain Score: 0-No pain Pain Type: Acute pain Pain Location: Teeth Pain Descriptors / Indicators: Aching Pain Frequency: Occasional Pain Onset: On-going Patients Stated Pain Goal: 3 Pain Intervention(s): Medication (See eMAR)   Other Treatments:     Therapy/Group: Individual Therapy  Carlos Levering 01/07/2020, 4:53 PM

## 2020-01-07 NOTE — Care Management (Signed)
[facility] Individual Statement of Services  Patient Name:  BRAXDEN HAINLINE  Date:  01/08/2020  Welcome to the [facility].  Our goal is to provide you with an individualized program based on your diagnosis and situation, designed to meet your specific needs.  With this comprehensive rehabilitation program, you will be expected to participate in at least 3 hours of rehabilitation therapies Monday-Friday, with modified therapy programming on the weekends.  Your rehabilitation program will include the following services:  Physical Therapy (PT), Occupational Therapy (OT), 24 hour per day rehabilitation nursing, Case Management (Social Worker), Rehabilitation Medicine, Nutrition Services and Pharmacy Services  Weekly team conferences will be held on Tuesdays to discuss your progress.  Your Social Worker will talk with you frequently to get your input and to update you on team discussions.  Team conferences with you and your family in attendance may also be held.  Expected length of stay:  7-10 days   Overall anticipated outcome: minimal assistance  Depending on your progress and recovery, your program may change. Your Social Worker will coordinate services and will keep you informed of any changes. Your Social Worker's name and contact numbers are listed  below.  The following services may also be recommended but are not provided by the [facility] will be made to provide these services after discharge if needed.  Arrangements include referral to agencies that provide these services.  Your insurance has been verified to be:  Medicaid Your primary doctor is:  (MD in [place] - cannot recall name)  Pertinent information will be shared with your doctor and your insurance company.  Social Worker:  [place], [place] or  ([phone]   Information discussed with and copy given to patient by: Lennart Pall, 01/08/2020, 6:40 AM

## 2020-01-07 NOTE — Progress Notes (Signed)
  Patient ID: Terrence Carr, male   DOB: [birth year], 59 y.o.   MRN: [id number]    Diagnosis codes:  Z47.81;  I73.9;  M86.9  Height:    5'6"            Weight:   133 lbs         Patient suffers from a left AKA, right fem pop bypass graft, osteomyelitis and PVD which impairs their ability to perform daily activities like bathing, dressing and mobility in the home.  A walker will not resolve issue with performing activities of daily living.  A wheelchair will allow patient to safely perform daily activities.  Patient is not able to propel themselves in the home using a standard weight wheelchair due to debility and weakness.  Patient can self propel in the lightweight wheelchair.   Lauraine Rinne, PA-C

## 2020-01-07 NOTE — Progress Notes (Signed)
Social Work Patient ID: Terrence Carr, male   DOB: [birth year], 59 y.o.   MRN: [id number]   Met with pt following team conf on Tuesday and spoke with his step-daughter, Terrence Carr.  They are aware and agreeable with plan for d/c on 1/8.  Terrence Carr to be here at 10 am for family ed prior to d/c.    Ferguson Gertner, LCSW

## 2020-01-07 NOTE — Progress Notes (Signed)
Occupational Therapy Session Note  Patient Details  Name: Terrence Carr MRN: [id number] Date of Birth: 1961/08/04  Today's Date: 01/07/2020 OT Individual Time:  -    patient scheduled for OT session at 11:00. Patient refused to participate at this time, will attempt to make up therapy session later this afternoon  Short Term Goals: Week 1:  OT Short Term Goal 1 (Week 1): STGs=LTGs due to ELOS  Skilled Therapeutic Interventions/Progress Updates:      Therapy Documentation Precautions:  Precautions Precautions: Fall Precaution Comments: Lt AKA, wounds R LE and Lt buttocks Restrictions Weight Bearing Restrictions: Yes RLE Weight Bearing: Weight bearing as tolerated LLE Weight Bearing: Non weight bearing General: General OT Amount of Missed Time: 60 Minutes Vital Signs:  Pain: Pain Assessment Pain Scale: 0-10 Pain Score: 0-No pain Other Treatments:     Therapy/Group: Individual Therapy  Carlos Levering 01/07/2020, 12:11 PM

## 2020-01-08 ENCOUNTER — Ambulatory Visit (HOSPITAL_COMMUNITY): Payer: Medicaid Other

## 2020-01-08 ENCOUNTER — Encounter (HOSPITAL_COMMUNITY): Payer: Medicaid Other | Admitting: Occupational Therapy

## 2020-01-08 NOTE — Progress Notes (Signed)
Pt was given D?C instructions by Linna Hoff, PA. All belongings and equipment were packed and sent home with pt who was accompained by step-daughter. No complaints or concerns to report. Amanda Cockayne, LPN

## 2020-01-08 NOTE — Discharge Instructions (Signed)
Inpatient Rehab Discharge Instructions  [facility] Discharge date and time: No discharge date for patient encounter.   Activities/Precautions/ Functional Status: Activity: activity as tolerated Diet: regular diet Wound Care: keep wound clean and dry Functional status:  ___ No restrictions     ___ Walk up steps independently ___ 24/7 supervision/assistance   ___ Walk up steps with assistance ___ Intermittent supervision/assistance  ___ Bathe/dress independently ___ Walk with walker     _x__ Bathe/dress with assistance ___ Walk Independently    ___ Shower independently ___ Walk with assistance    ___ Shower with assistance ___ No alcohol     ___ Return to work/school ________    COMMUNITY REFERRALS UPON DISCHARGE:    Home Health:   PT     OT     RN                       Agency:  [facility] @ [phone]  Medical Equipment/Items Ordered: wheelchair, cushion, drop arm commode                                                     Agency/Supplier:  [facility]     Special Instructions: No driving smoking or alcohol  Home health nurse wound care to right lower extremity pretibial full-thickness wound and right foot wound at base of great toe.  Cleanse with normal saline, pat dry.  When dry apply dry gauze dressing and secure with Kerlix roll gauze paper tape change twice daily  Silicone foam to buttock wound change every 3 days and as needed soilage  Follow up with PCP on resuming blood pressure meds as needed   My questions have been answered and I understand these instructions. I will adhere to these goals and the provided educational materials after my discharge from the hospital.  Patient/Caregiver Signature _______________________________ Date __________  Clinician Signature _______________________________________ Date __________  Please bring this form and your medication list with you to all your follow-up doctor's appointments.

## 2020-01-08 NOTE — Progress Notes (Signed)
[place] PHYSICAL MEDICINE & REHABILITATION PROGRESS NOTE   Subjective/Complaints:  Pt reports had a good sized BM- but on bedpan and needs help to get off of it.    Ready for d/c today.  ROS- pt denies SOB, CP, Abd pain, N/V/D, (+) constipation, visual changes,  Objective:   No results found. No results for input(s): WBC, HGB, HCT, PLT in the last 72 hours. No results for input(s): NA, K, CL, CO2, GLUCOSE, BUN, CREATININE, CALCIUM in the last 72 hours.  Intake/Output Summary (Last 24 hours) at 01/08/2020 0955 Last data filed at 01/08/2020 0924 Gross per 24 hour  Intake 600 ml  Output 2200 ml  Net -1600 ml     Physical Exam: Vital Signs Blood pressure (!) 123/57, pulse 63, temperature 98.6 F (37 C), resp. rate 16, height 5\' 5"  (1.651 m), weight 60.3 kg, SpO2 100 %.  .  Physical Exam  Nursing note and vitals reviewed.  Constitutional: No distress.  Cachetic but looks better overall, better color/fuller face; sitting up in bed, on bedpan NAD HENT:  Head: Normocephalic and atraumatic.  Mouth/Throat: No oropharyngeal exudate.  Patient with poor dentition -  Eyes: EOMs intact Neck: No tracheal deviation present.  Cardiovascular:  RRR  Respiratory:  CTA B/L, no W/R/R  GI:  Soft, NT, ND, (+) BS Musculoskeletal:  Cervical back: Normal range of motion and neck supple.  Comments: RUE 4+/5- deltoid, biceps, triceps, WE, grip, finger abd LUE- deltoid 2+/5, otherwise 4+/5 in LUE RLE- at least 3+/5- wouldn't push secondary to "wounds" LLE- HF 3+/5- L AKA Neurological:. Sensation intact on L AKA Sensation to LT decreased below knee on RLE  Skin: No rash noted. He is not diaphoretic. No erythema. R groin incision appears well healed but has swelling, nontender to palpation.  Left AKA with staples in place no drainage, no erythema- looks great- (+) dog ears. Bypass sites lower extremities dressed with palpable pulses R shin wound and R top of foot have dressing- wounds  bleeding through- Buttocks- on R buttock- multiple areas slightly open- like previous pimples or moisture? Sacrum slightly open- appears due to moisture- <1cm x 1cm; L buttock has pressure ulcer - superficially open- pink, with a little slough in middle- ~ 5-7 cm Dry skin along fem-pop bypass on RLE Psychiatric:  More aware today   Assessment/Plan: 1. Functional deficits secondary to L AKA and fempop bypass which require 3+ hours per day of interdisciplinary therapy in a comprehensive inpatient rehab setting.  Physiatrist is providing close team supervision and 24 hour management of active medical problems listed below.  Physiatrist and rehab team continue to assess barriers to discharge/monitor patient progress toward functional and medical goals  Care Tool:  Bathing    Body parts bathed by patient: Right arm, Left arm, Chest, Abdomen, Face   Body parts bathed by helper: Buttocks Body parts n/a: Left lower leg, Right lower leg(right lower leg wound dressing intact)   Bathing assist Assist Level: Minimal Assistance - Patient > 75%     Upper Body Dressing/Undressing Upper body dressing   What is the patient wearing?: Pull over shirt    Upper body assist Assist Level: Set up assist    Lower Body Dressing/Undressing Lower body dressing    Lower body dressing activity did not occur: Refused What is the patient wearing?: Pants     Lower body assist Assist for lower body dressing: Minimal Assistance - Patient > 75%     Toileting Toileting Toileting Activity did not  occur Landscape architect and hygiene only): Refused  Toileting assist Assist for toileting: Minimal Assistance - Patient > 75%     Transfers Chair/bed transfer  Transfers assist     Chair/bed transfer assist level: Supervision/Verbal cueing     Locomotion Ambulation   Ambulation assist   Ambulation activity did not occur: Safety/medical concerns(limited by non-healing R LE wounds and pain)           Walk 10 feet activity   Assist  Walk 10 feet activity did not occur: Safety/medical concerns(limited by non-healing R LE wounds and pain)        Walk 50 feet activity   Assist Walk 50 feet with 2 turns activity did not occur: Safety/medical concerns(limited by non-healing R LE wounds and pain)         Walk 150 feet activity   Assist Walk 150 feet activity did not occur: Safety/medical concerns(limited by non-healing R LE wounds and pain)         Walk 10 feet on uneven surface  activity   Assist Walk 10 feet on uneven surfaces activity did not occur: Safety/medical concerns(limited by non-healing R LE wounds and pain)         Wheelchair     Assist Will patient use wheelchair at discharge?: Yes Type of Wheelchair: Manual    Wheelchair assist level: Supervision/Verbal cueing Max wheelchair distance: >400 feet    Wheelchair 50 feet with 2 turns activity    Assist        Assist Level: Supervision/Verbal cueing   Wheelchair 150 feet activity     Assist      Assist Level: Supervision/Verbal cueing   Blood pressure (!) 123/57, pulse 63, temperature 98.6 F (37 C), resp. rate 16, height 5\' 5"  (1.651 m), weight 60.3 kg, SpO2 100 %.  Medical Problem List and Plan:  1. Decreased functional ability secondary to peripheral vascular disease status post left AKA 12/24/2019 as well as right femoral-popliteal bypass 12/30/2019  -patient may shower if L AKA is covered  -ELOS/Goals: Mod I to min assist- 10-14 days  2. Antithrombotics:  -DVT/anticoagulation: Subcutaneous heparin  -antiplatelet therapy: Aspirin 81 mg daily  3. Pain Management: Neurontin 100 mg 3 times daily, oxycodone as needed  4. Mood: Provide emotional support  -antipsychotic agents: N/A  5. Neuropsych: This patient is capable of making decisions on his own behalf.  6. Skin/Wound Care- wounds on RLE, groin from R fempop bypass and Stage II on L buttocks: Routine skin  checks/sacral foam prophylactic dressing change every third day- needs dressing changes for RLE as well.  1/1- L buttock pressure ulcer, sacral/coccyx looks like moisture- <1x1cm- few spots on R buttock- doesn't appear to be pressure- will consult WOC   1/4: Has swelling at right groin incision site. Nontender to palpation. Continue to monitor.  7. Fluids/Electrolytes/Nutrition: Routine in and outs with follow-up chemistries   1/1- good results in BMP today 8. Acute blood loss anemia. Follow-up CBC  -last CBC showed Hb of 8.4- down from 11 after surgery- will monitor   1/4:  Hgb stable at 8.1 9. Hypertension. Monitor with increased mobility   1/6- stable- con't regimen 10. Hyperlipidemia. Lipitor  11. History of tobacco alcohol use- at least 1/2ppd- . Provide counseling.  12. Vitamin B12 deficiency. Continue supplement  13. Constipation. Laxative assistance  1/2- will stop Colace and make prn as well as Miralax since having so many stools- might need to increase again, but for now, will make prn.  1/4: Was able to have small hard BM with soap suds enema yesterday. Requests again today, ordered. Patient denies abdominal pain currently, but nurse reports he has has complained of pain.  1/5- Will order Mg citrate then soap suds enema based on KUB. Will add Senokot 1 BID  1/6- Good BMs- cleaned out yesterday afternoon  1/7- no BM since 1/5- will need prns today again.   1/8- BM this AM- before d/c.  14. Alcohol abuse- pt finally admitted since retirement was drinking "1/2 pint every day of vodka"- monitor for withdrawal- likely drinking more.  15. Severe protein malnutrition- BMI is 20- but albumin is 1.6- which is significant and total protein is 5.7- will con't protein supplements, and if loses more weight, will consult nutrition.  16. L shoulder weakness/discomfort- will d/w OT in how it relates to function.  100. Ostemyelitis? - Pt was on Vanc- doesn't need Vanc anymore per vascular physicians  since had L AKA.     LOS: 8 days A FACE TO FACE EVALUATION WAS PERFORMED  Terrence Carr 01/08/2020, 9:55 AM

## 2020-01-08 NOTE — Progress Notes (Signed)
Physical Therapy Session Note  Patient Details  Name: Terrence Carr MRN: [id number] Date of Birth: [birth year]  Today's Date: 01/08/2020 PT Individual Time: 1100-1120 PT Individual Time Calculation (min): 20 min   Short Term Goals: Week 1:  PT Short Term Goal 1 (Week 1): STG=LTG due to ELOS Week 2:    Week 3:     Skilled Therapeutic Interventions/Progress Updates:      Therapy Documentation Precautions:  Precautions Precautions: Fall Precaution Comments: Lt AKA, wounds R LE and Lt buttocks Restrictions Weight Bearing Restrictions: Yes RLE Weight Bearing: Weight bearing as tolerated LLE Weight Bearing: Non weight bearing  PAIN denies pain this am  Daughter present today for family training.  Discussed removing L legrest and storing at home bc not needed, removal improves ease of transfers.   Pt propells his wc mod I to gym for car transfer training.  Pt stated - I can do this, I don't need no help.  However, pt w/poor safety demonstrated w/set up and initialtion of transfer attempting to stand on RLE w/foot on footrest and "heave" himself into car using metal bar over doorway.  Educated pt and daughter as to safety issues w/this option and reviewed operation of legrests/demonstrated removal of L legrest as well as safe setup for transfer.  Pt then performed wc to/from car w/verbal cues for safety, hand positioing, instruction for guarding and stabilizing wc.   Following this effort pt stated he was finished and heading home.  Encouraged pt to repeat transfer w/daughter so that she would have the opportunity to practice w/patient, discussed increased challenges w/uneven surfaces, wet surfaces, etc.  Pt finally agreed and daughter performed car transfer w/verbal cues/instruction for safety.  Pt refused to repeat this training, daughter stated she felt confident w/assisting pt w/transfers despite minimal training. Daughter also verbally instructed w/guarding pt w/ramp negotiation and  therapist/pt demonstrated ramp single effort, pt refused further transfer/family training.   Pt cooperation limiting factor to thorough training session.   Therapy/Group: Individual Therapy  Callie Fielding, [place] 01/08/2020, 12:31 PM

## 2020-01-08 NOTE — Plan of Care (Signed)
  Problem: Consults Goal: Skin Care Protocol Initiated - if Braden Score 18 or less Description: If consults are not indicated, leave blank or document N/A 01/08/2020 1225 by Amanda Cockayne, LPN Outcome: Completed/Met 01/08/2020 1225 by Amanda Cockayne, LPN Outcome: Progressing Goal: Nutrition Consult-if indicated 01/08/2020 1225 by Amanda Cockayne, LPN Outcome: Completed/Met 01/08/2020 1225 by Amanda Cockayne, LPN Outcome: Progressing   Problem: RH BOWEL ELIMINATION Goal: RH STG MANAGE BOWEL WITH ASSISTANCE Description: STG Manage Bowel with [place]. 01/08/2020 1225 by Toy Cookey F, LPN Outcome: Completed/Met 01/08/2020 1225 by Amanda Cockayne, LPN Outcome: Progressing   Problem: RH SKIN INTEGRITY Goal: RH STG SKIN FREE OF INFECTION/BREAKDOWN 01/08/2020 1225 by Amanda Cockayne, LPN Outcome: Completed/Met 01/08/2020 1225 by Amanda Cockayne, LPN Outcome: Progressing Goal: RH STG MAINTAIN SKIN INTEGRITY WITH ASSISTANCE Description: STG Maintain Skin Integrity With  Mod Assistance. 01/08/2020 1225 by Toy Cookey F, LPN Outcome: Completed/Met 01/08/2020 1225 by Amanda Cockayne, LPN Outcome: Progressing Goal: RH STG ABLE TO PERFORM INCISION/WOUND CARE W/ASSISTANCE Description: STG Able To Perform Incision/Wound Care With  Mod Assistance. 01/08/2020 1225 by Amanda Cockayne, LPN Outcome: Completed/Met 01/08/2020 1225 by Amanda Cockayne, LPN Outcome: Progressing   Problem: Consults Goal: RH LIMB LOSS PATIENT EDUCATION Description: Description: See Patient Education module for eduction specifics. 01/08/2020 1225 by Toy Cookey F, LPN Outcome: Completed/Met 01/08/2020 1225 by Amanda Cockayne, LPN Outcome: Progressing

## 2020-01-08 NOTE — Plan of Care (Signed)
  Problem: Consults Goal: Skin Care Protocol Initiated - if Braden Score 18 or less Description: If consults are not indicated, leave blank or document N/A Outcome: Progressing Goal: Nutrition Consult-if indicated Outcome: Progressing   Problem: RH BOWEL ELIMINATION Goal: RH STG MANAGE BOWEL WITH ASSISTANCE Description: STG Manage Bowel with [place]. Outcome: Progressing   Problem: RH SKIN INTEGRITY Goal: RH STG SKIN FREE OF INFECTION/BREAKDOWN Outcome: Progressing Goal: RH STG MAINTAIN SKIN INTEGRITY WITH ASSISTANCE Description: STG Maintain Skin Integrity With  Mod Assistance. Outcome: Progressing Goal: RH STG ABLE TO PERFORM INCISION/WOUND CARE W/ASSISTANCE Description: STG Able To Perform Incision/Wound Care With  Mod Assistance. Outcome: Progressing   Problem: Consults Goal: RH LIMB LOSS PATIENT EDUCATION Description: Description: See Patient Education module for eduction specifics. Outcome: Progressing

## 2020-01-09 NOTE — Progress Notes (Signed)
Social Work Discharge Note   The overall goal for the admission was met for:   Discharge location: Yes - home with step-daughter and granddaughter to assist  Length of Stay: Yes - 8 days  Discharge activity level: Yes - minimal assistance overall  Home/community participation: Yes  Services provided included: MD, RD, PT, OT, RN, Pharmacy and SW  Financial Services: Medicaid  Follow-up services arranged: Home Health: RN, PT, OT via [facility], DME: 16x16 lightweight w/c, cushion, drop arm commode via [facility] and Patient/Family has no preference for HH/DME agencies  Comments (or additional information):   contact info:  Daughter, Amedeo Kinsman @ [phone]  Patient/Family verbalized understanding of follow-up arrangements: Yes  Individual responsible for coordination of the follow-up plan: pt  Confirmed correct DME delivered: Lennart Pall 01/09/2020    Neka Bise

## 2020-01-11 NOTE — Discharge Summary (Signed)
Physician Discharge Summary  Patient ID: Terrence Carr MRN: [id number] DOB/AGE: [birth year] 59 y.o.  Admit date: 12/31/2019 Discharge date: 01/08/2020  Discharge Diagnoses:  Principal Problem:   Left above-knee amputee [facility]) Active Problems:   Sacral decubitus ulcer   Essential hypertension   Osteomyelitis of multiple sites ([place])   Pressure ulcer of left buttock, stage 2 (HCC)   Discharged Condition: Stable  Significant Diagnostic Studies: DG Tibia/Fibula Left  Result Date: 12/23/2019 CLINICAL DATA:  Multiple wounds. Assessment for osteomyelitis. EXAM: LEFT TIBIA AND FIBULA - 2 VIEW COMPARISON:  None FINDINGS: Diffuse mild soft tissue swelling of the left lower leg. Question some subcortical lucency along the distal fibular metadiaphysis with overlying material. If there is visible ulceration at this location, could reflect early features of osteomyelitis. No other erosion, destructive change, or immature periostitis to suggest a discrete focus of infection. Few fragments in the suprapatellar joint recess of the left knee, may be posttraumatic or degenerative in nature. Serpiginous sclerosis in the distal femur likely sequela of prior infarct. Atherosclerotic calcifications noted in the posterior knee. IMPRESSION: 1. Question subtle subcortical lucency along the distal fibular metadiaphysis with overlying bandage material. If there is visible ulceration at this location, could reflect early features of osteomyelitis. 2. No other erosion, destructive change, or immature periostitis to suggest a discrete focus of infection. Electronically Signed   By: Lovena Le M.D.   On: 12/23/2019 18:59   DG Tibia/Fibula Right  Result Date: 12/23/2019 CLINICAL DATA:  Wounds, assess for ostia EXAM: RIGHT TIBIA AND FIBULA - 2 VIEW COMPARISON:  Concurrent ankle radiographs FINDINGS: Diffuse edematous thickening of the right lower extremity which is increasing towards the level of the foot. There is  circumferential swelling at the level of the ankle with a large effusion and some questionable gas suspicious lucency noted in the subcortical bone of the distal tibial metaphysis. Mild tricompartmental degenerative changes are seen at the knee. Vascular calcium in the soft tissues. No acute fracture or traumatic malalignment. IMPRESSION: 1. Diffuse edematous thickening of the right lower extremity which is increasing towards the level of the foot. 2. There is a large ankle effusion, questionable gas in the joint, and circumferential swelling at the level of the ankle. 3. Subcortical lucency at the distal tibial metaphysis, worrisome for osteomyelitis. Electronically Signed   By: Lovena Le M.D.   On: 12/23/2019 19:09   DG Abd 1 View  Result Date: 01/04/2020 CLINICAL DATA:  Constipation EXAM: ABDOMEN - 1 VIEW COMPARISON:  09/04/2019 FINDINGS: No dilated large or small bowel. Normal moderate stool volume. Gas the rectum. Calcification of the iliac arteries with stent in the RIGHT common iliac artery. IMPRESSION: No bowel obstruction.  Moderate volume stool. Electronically Signed   By: Suzy Bouchard M.D.   On: 01/04/2020 13:03   PERIPHERAL VASCULAR CATHETERIZATION  Result Date: 12/28/2019 Patient name: Terrence Carr MRN: [id number] DOB: [birth year] Sex: male 12/28/2019 Pre-operative Diagnosis: Critical right lower extremity ischemia with wounds Post-operative diagnosis:  Same Surgeon:  Erlene Quan C. Donzetta Matters, MD Procedure Performed: 1.  Ultrasound-guided cannulation left common femoral artery 2.  Aortogram 3.  Selection of right common femoral artery and right lower extremity angiography 4.  Moderate sedation with fentanyl and Versed for 22 minutes Indications: 59 year old male presented with bilateral lower extremity ischemia.  He is undergone left above-knee amputation.  He is now indicated for right lower extremity angiography possible intervention. Findings: Aorta and iliac segments are free of flow-limiting  stenosis.  The right side common  iliac artery has a stent that appears patent.  Common femoral artery has patent patch.  The SFA is flush occluded.  He reconstitutes above the knee popliteal artery which is diseased to below the knee.  Does have three-vessel runoff to the foot. He will be considered for right femoral to popliteal artery bypass.  Procedure:  The patient was identified in the holding area and taken to room 8.  The patient was then placed supine on the table and prepped and draped in the usual sterile fashion.  A time out was called.  Ultrasound was used to evaluate the left common femoral artery.  There is anesthetized 1% lidocaine cannulated with direct ultrasound visualization a micropuncture needle followed by wire sheath.  Bentson wire was placed.  And images saved the permanent record of the ultrasound-guided cannulation site.  5 French sheath was placed.  Omni catheter placed to level L1 aortogram performed.  We crossed bifurcation perform right lower extremity angiography.  With above findings patient will need bypass surgery.  He remains high risk for amputation.  Catheter was removed over wire.  Sheath will be pulled in postoperative holding.  He tolerated procedure well without any complication Contrast: 65 cc Brandon C. Donzetta Matters, MD [facility] Office: [phone] Pager: [phone]  DG Foot Complete Left  Result Date: 12/23/2019 CLINICAL DATA:  Wounds, assess for osteomyelitis EXAM: LEFT FOOT - COMPLETE 3+ VIEW COMPARISON:  None. FINDINGS: Diffuse soft tissue swelling of the foot. There is ulceration noted along the plantar aspect of the heel and posterolateral aspect of the foot. No subjacent osseous erosion, cortical lucency or immature periostitis to suggest site of osteomyelitis. No other radiographically evident site of ulceration. Vascular calcium noted in the soft tissues. Diffuse degenerative changes in the midfoot are noted most pronounced  involving navicular, medial cuneiform and base of the first metatarsal. Clawtoe deformities of the second through fifth digits. IMPRESSION: 1. Soft tissue ulceration along the plantar aspect of the heel and posterolateral aspect of the foot. 2. No convincing radiographic evidence for osteomyelitis. If there is persisting concern contrast-enhanced MRI is more sensitive and specific. 3. Diffuse soft tissue swelling of the foot. 4. Diffuse degenerative changes in the midfoot. Electronically Signed   By: Lovena Le M.D.   On: 12/23/2019 19:02   DG Foot Complete Right  Result Date: 12/23/2019 CLINICAL DATA:  Wounds, assess for osteomyelitis. EXAM: RIGHT FOOT COMPLETE - 3+ VIEW COMPARISON:  None. FINDINGS: There is diffuse soft tissue swelling and ulceration of the foot at the level of the plantar calcaneus and along the medial aspect of the head of the first metatarsal. There is some transcortical lucency at the base of the first distal phalanx. The osseous structures appear diffusely demineralized which may limit detection of small or nondisplaced fractures or subtle osseous features of osteomyelitis. There is questionable intra-articular gas at the level of the ankle. There is a severe hallux valgus deformity of the first ray with marked clawtoe deformities of the second through fifth digits. Extensive degenerative change noted through the mid and hindfoot as well as at the level of the ankle. No acute fracture or malalignment is seen. Ankle effusion is noted. IMPRESSION: 1. Diffuse soft tissue swelling and focal ulceration of the foot at the level of the plantar calcaneus and along the medial aspect of the head of the first metatarsal. 2. There is some transcortical lucency at the base of the first distal phalanx which could be secondary to osteomyelitis. 3.  Question some intra-articular gas at the level of the ankle. Consider cross-sectional imaging. 4. Diffuse bony demineralization and degenerative changes,  as above. Electronically Signed   By: Lovena Le M.D.   On: 12/23/2019 19:07   VAS [place] ABI WITH/WO TBI  Result Date: 12/27/2019 LOWER EXTREMITY DOPPLER STUDY Indications: Gangrene.  Vascular Interventions: Left AKA. Comparison Study: No prior study. Performing Technologist: Maudry Mayhew MHA, RVT, RDCS, RDMS  Examination Guidelines: A complete evaluation includes at minimum, Doppler waveform signals and systolic blood pressure reading at the level of bilateral brachial, anterior tibial, and posterior tibial arteries, when vessel segments are accessible. Bilateral testing is considered an integral part of a complete examination. Photoelectric Plethysmograph (PPG) waveforms and toe systolic pressure readings are included as required and additional duplex testing as needed. Limited examinations for reoccurring indications may be performed as noted.  ABI Findings: +--------+------------------+-----+-------------------+--------+ Right   Rt Pressure (mmHg)IndexWaveform           Comment  +--------+------------------+-----+-------------------+--------+ Brachial120                    triphasic                   +--------+------------------+-----+-------------------+--------+ PTA                            dampened monophasic         +--------+------------------+-----+-------------------+--------+ DP                             dampened monophasic         +--------+------------------+-----+-------------------+--------+ +--------+------------------+-----+--------+-------------+ Left    Lt Pressure (mmHg)IndexWaveformComment       +--------+------------------+-----+--------+-------------+ Brachial                               Not evaluated +--------+------------------+-----+--------+-------------+  Summary: Right: Abnormal right pedal waveforms suggestive of possible severe arterial insufficiency. Patient unable to tolerate blood pressure cuff, therefore unable to obtain right  ABI.  *See table(s) above for measurements and observations.  Electronically signed by Monica Martinez MD on 12/27/2019 at 1:42:16 PM.   Final    VAS [place] LOWER EXTREMITY SAPHENOUS VEIN MAPPING  Result Date: 12/29/2019 LOWER EXTREMITY VEIN MAPPING Indications:  Pre-op Risk Factors: PAD.  Comparison Study: No prior study Performing Technologist: Sharion Dove RVS  Examination Guidelines: A complete evaluation includes B-mode imaging, spectral Doppler, color Doppler, and power Doppler as needed of all accessible portions of each vessel. Bilateral testing is considered an integral part of a complete examination. Limited examinations for reoccurring indications may be performed as noted. +---------------+-----------+----------------------+---------------+-----------+   RT Diameter  RT Findings         GSV            LT Diameter  LT Findings      (cm)                                            (cm)                  +---------------+-----------+----------------------+---------------+-----------+      0.49                     Saphenofemoral  Junction                                  +---------------+-----------+----------------------+---------------+-----------+      0.38                     Proximal thigh                               +---------------+-----------+----------------------+---------------+-----------+      0.38       branching       Mid thigh                                  +---------------+-----------+----------------------+---------------+-----------+      0.38                      Distal thigh                                +---------------+-----------+----------------------+---------------+-----------+      0.22                          Knee                                    +---------------+-----------+----------------------+---------------+-----------+      0.26       branching        Prox calf                                  +---------------+-----------+----------------------+---------------+-----------+      0.24       branching        Mid calf                                  +---------------+-----------+----------------------+---------------+-----------+      0.24                      Distal calf                                 +---------------+-----------+----------------------+---------------+-----------+      0.21                         Ankle                                    +---------------+-----------+----------------------+---------------+-----------+ Diagnosing physician: Ruta Hinds MD Electronically signed by Ruta Hinds MD on 12/29/2019 at 1:46:13 PM.    Final     Labs:  Basic Metabolic Panel: No results for input(s): NA, K, CL, CO2, GLUCOSE, BUN, CREATININE, CALCIUM, MG, PHOS in the last 168 hours.  CBC: No results for input(s): WBC, NEUTROABS, HGB, HCT, MCV, PLT in the last 168 hours.  CBG: No results for input(s): GLUCAP in the last 168 hours.  Family history.  Mother with hypertension hyperlipidemia.  Denies diabetes mellitus colon cancer or rectal cancer  Brief HPI:   Terrence Carr is a 59 y.o. male  with history of hypertension tobacco and alcohol abuse as well as vitamin B12 deficiency and peripheral vascular disease with revascularization right iliac artery stenting placement Dr. Radene Knee in March 2020 maintained on Plavix.  Per chart review lives with girlfriend, stepdaughter and her boyfriend.  Independent with assistive device prior to admission.  Presented 12/23/[facility] to [facility] with acute onset progressive worsening of wounds bilateral lower extremities x3 weeks left greater than right.  X-rays concerning for osteomyelitis at the base of the first distal phalanx of the right foot as well as left distal fibular metadiaphysis and was started on IV antibiotics.  Patient transferred to [facility] for further  evaluation.  Follow-up vascular surgery with profound ischemic gangrenous changes of left lower extremity underwent left AKA 12/24/2019 per Dr. Donnetta Hutching.  Patient with follow-up evaluation of right lower extremity.  ABIs nondetectable.  Underwent right lower extremity angiogram subsequently followed by right common femoral profunda femoral enterectomy, right common femoral to below-knee popliteal artery bypass 12/30/2019 per Dr. Donzetta Matters.  Hospital course pain management.  Subcutaneous heparin for DVT prophylaxis.  Hospital course anemia 6.7 transfuse latest hemoglobin 8.4.  Noted left buttocks pressure ulceration stage II with wound care as directed.  Patient was admitted for a comprehensive rehab program  Hospital Course: KALEE BURSEY was admitted to rehab 12/31/2019 for inpatient therapies to consist of PT, ST and OT at least three hours five days a week. Past admission physiatrist, therapy team and rehab RN have worked together to provide customized collaborative inpatient rehab. Pertaining to patient left AKA 12/24/2019 as well as right femoral popliteal bypass 12/30/2019 patient would follow-up with vascular surgery wound care as directed.  Wound care to right lower extremity pretibial full-thickness wound and right foot wound at base of great toe cleansed with normal saline pat dry.  Pain in wound with Betadine swab stick and allow it to air dry.  When dry, apply dry gauze dressing and secure with Kerlix roll gauze paper tape change twice daily and float heel.  In regards to patient's buttocks sacral wound silicone foam to the buttock wound change every 3 days and as needed soilage.  Patient was instructed on pressure relief.  Subcutaneous heparin for DVT prophylaxis no other bleeding episodes as well as continue on low-dose aspirin.  Neurontin 100 mg 3 times daily as well as oxycodone for breakthrough pain for pain management.  Acute blood loss anemia latest hemoglobin 8.1.  Blood pressure labile with no  current antihypertensive medications and would follow-up with primary MD on resuming back anti hypertensive meds as needed..  Lipitor ongoing for hyperlipidemia.  Bouts of constipation resolved with laxative assistance.  Dietary service follow-up for nutritional support.  All issues with regards to alcohol and tobacco use were discussed with patient and family in regards to cessation of these products it was questionable if he would be compliant with these request  Blood pressures were monitored on TID basis and monitored  He/ is continent of bowel and bladder.  He/ has made gains during rehab stay and is attending therapies  He/ will continue to receive follow up therapies   after discharge  Rehab course: During patient's stay in rehab weekly team conferences were held to monitor patient's progress, set goals and discuss barriers to discharge. At admission, patient required moderate assist for rolling, moderate assist  supine to sit, moderate assist side-lying to sitting, max assist sit to stand.  Set up upper body bathing max is lower body bathing minimal assist upper body dressing total assist lower body dressing max assist toilet transfers  Physical exam. Blood pressure 139/75 pulse 77 temperature 98.1 respirations 13 oxygen saturation 94% room air Constitutional.  No distress patient cachectic HEENT Head.  Normocephalic and atraumatic Mouth/throat.  No oropharyngeal exudate patient poor dentition Eyes.  Pupils round and reactive to light no nystagmus Neck.  Supple nontender no JVD no thyromegaly Cardiovascular regular rate rhythm without any extra sounds or murmur heard Respiratory.  Effort normal no respiratory distress without wheezes GI.  Soft nontender positive bowel sounds without rebound Musculoskeletal.  Right upper extremity 4+ out of 5 to deltoid, bicep tricep, wrist extension grip and finger abduction. Left upper extremity deltoid 2+ out of 5 otherwise 4+ out of 5 in left upper  extremity Right lower extremity at least 3+ out of 5 Left lower extremity hip flexion 3+ out of 5 Skin.  Left AKA with staples in place no drainage no erythema Bypass sites lower extremities dressed with palpable pulses Right shin wound and right top of foot have dressings in place  He/She  has had improvement in activity tolerance, balance, postural control as well as ability to compensate for deficits. He/She has had improvement in functional use RUE/LUE  and RLE/LLE as well as improvement in awareness Patient perform supine to sit from sit with supervision and a flat bed and on a mat table and rolling supine prone on mat table supervision for safety.  Patient perform lateral scoot transfers bed to wheelchair wheelchair to mat table wheelchair to car simulator to sedan seat height and wheelchair to drop down bedside commode with contact-guard assist.  Provided verbal cues for hand placement and wheelchair set up.  Patient perform lower body dressing and supervision for pants and minimal assist for briefs and pericare.  Required minimum assist due to loss of balance on bedside commode at times.  Propels his wheelchair with supervision      Disposition: Discharged home   Diet: Regular  Special Instructions: No driving smoking or alcohol  Wound care.  Wound care to right lower extremity pretibial full-thickness wound and right foot wound at base of great toe.  Cleanse with normal saline, pat dry.  When dry apply dry gauze dressing and secure with Kerlix roll gauze paper tape change twice daily  Silicone foam to buttock wound change every 3 days and as needed soilage  Follow-up PCP on resuming back blood pressure medications as needed  Medications at discharge 1 Tylenol as needed 2 aspirin 81 mg p.o. daily 3 Lipitor 20 mg p.o. daily 4.  Folic acid 1 mg p.o. daily 5.  Neurontin 100 mg p.o. 3 times daily 6.  Multivitamin daily 7.  Oxycodone 5 to 10 mg every 4 hours as needed pain 8.   Protonix 40 mg p.o. daily 9.  Senokot 1 tab p.o. twice daily 10.  Vitamin B12 1000 mcg p.o. daily 11.  Wellbutrin 100 mg p.o. twice daily 13.naltrexone 50 mg p.o. daily  Discharge Instructions    Ambulatory referral to Physical Medicine Rehab   Complete by: As directed    Moderate complexity follow up 1-2 weeks Left AKA     Allergies as of 01/08/2020      Reactions   Ibuprofen Other (See Comments)   Patient said "the little brown pills" that he bought from the store made  him talk out of his head (reported by [facility])   Other Other (See Comments)   Unknown reaction to "Dihydroxyalaminum aminoacetate magnesium carbonate" (reported by [facility]      Medication List    STOP taking these medications   amLODipine 5 MG tablet Commonly known as: NORVASC   feeding supplement (ENSURE ENLIVE) Liqd   ferrous sulfate 325 (65 FE) MG tablet   guaiFENesin-dextromethorphan 100-10 MG/5ML syrup Commonly known as: ROBITUSSIN DM   lisinopril 20 MG tablet Commonly known as: ZESTRIL   metoprolol tartrate 25 MG tablet Commonly known as: LOPRESSOR   thiamine 100 MG tablet   vancomycin 750 MG/150ML Soln Commonly known as: VANCOREADY     TAKE these medications   acetaminophen 325 MG tablet Commonly known as: TYLENOL Take 1-2 tablets (325-650 mg total) by mouth every 4 (four) hours as needed for mild pain (or temp >/= 101 F).   aspirin 81 MG EC tablet Take 1 tablet (81 mg total) by mouth daily.   atorvastatin 20 MG tablet Commonly known as: LIPITOR Take 1 tablet (20 mg total) by mouth daily at 6 PM.   bisacodyl 5 MG EC tablet Commonly known as: DULCOLAX Take 1 tablet (5 mg total) by mouth daily as needed for moderate constipation.   buPROPion 100 MG 12 hr tablet Commonly known as: WELLBUTRIN SR Take 1 tablet (100 mg total) by mouth 2 (two) times daily.   cyanocobalamin 1000 MCG tablet Take 1 tablet (1,000 mcg total) by mouth daily.   docusate sodium 100 MG  capsule Commonly known as: COLACE Take 1 capsule (100 mg total) by mouth 2 (two) times daily.   folic acid 1 MG tablet Commonly known as: FOLVITE Take 1 tablet (1 mg total) by mouth daily.   gabapentin 100 MG capsule Commonly known as: NEURONTIN Take 1 capsule (100 mg total) by mouth 3 (three) times daily. What changed:   medication strength  how much to take   naltrexone 50 MG tablet Commonly known as: DEPADE Take 50 mg by mouth daily.   oxyCODONE 5 MG immediate release tablet Commonly known as: Oxy IR/ROXICODONE Take 1-2 tablets (5-10 mg total) by mouth every 4 (four) hours as needed for moderate pain.   pantoprazole 40 MG tablet Commonly known as: PROTONIX Take 1 tablet (40 mg total) by mouth daily.   polyethylene glycol 17 g packet Commonly known as: MIRALAX / GLYCOLAX Take 17 g by mouth daily as needed for mild constipation.   Thera-M Tabs Take 1 tablet by mouth daily.      Follow-up Information    Lovorn, Jinny Blossom, MD Follow up.   Specialty: Physical Medicine and Rehabilitation Why: Office to call for appointment Contact information: [id number] [address] [place] [address] [phone]        Waynetta Sandy, MD Follow up.   Specialties: Vascular Surgery, Cardiology Why: Call for appointment Contact information: [place] [address] [phone]        Rosetta Posner, MD Follow up.   Specialties: Vascular Surgery, Cardiology Why: Call for appointment Contact information: [address] [place] [address] [phone]           Signed: Cathlyn Parsons 01/11/2020, 6:41 AM

## 2020-01-12 ENCOUNTER — Telehealth: Payer: Self-pay | Admitting: Registered Nurse

## 2020-01-12 NOTE — Telephone Encounter (Signed)
Placed a call to Mr. Terrence Carr, no answer. Placed a call to Ms. Terrence Carr, left voice mail to return the call.

## 2020-01-15 NOTE — Progress Notes (Signed)
  Patient ID: Terrence Carr, male   DOB: October 23, 1961, 59 y.o.   MRN: [id number]   Diagnosis codes:  Z47.81; I73.9;  M86.9;  Z89.612  Height:  5'6"  Weight:  133 lbs    Patient suffers from left AKA, right fem pop bypass graft, osteomyelitis and PVD which impairs their ability to perform daily activities like bathing, dressing and toileting in the home.  A walker or cane will not resolve the issues with performing these ADLs.  A high strength lightweight wheelchair will allow patient to safely perform daily activities.  This wheelchair will be used primarily in the home setting.  The patient is not able to propel themselves in the home using a standard or lightweight wheelchair due to arm weakness and endurance.  Patient can self propel in the high strength lightweight wheelchair.  I have personally performed a face to face evaluation of this patient and recommend this wheelchair.  Lauraine Rinne, PA-C

## 2020-01-20 ENCOUNTER — Encounter: Payer: Self-pay | Admitting: Physical Medicine and Rehabilitation

## 2020-01-20 ENCOUNTER — Other Ambulatory Visit: Payer: Self-pay

## 2020-01-20 ENCOUNTER — Encounter: Payer: Medicaid Other | Admitting: Physical Medicine and Rehabilitation

## 2020-01-20 ENCOUNTER — Encounter
Payer: Medicaid Other | Attending: Physical Medicine and Rehabilitation | Admitting: Physical Medicine and Rehabilitation

## 2020-01-20 VITALS — BP 157/72 | HR 82 | Temp 97.8°F | Ht 66.0 in | Wt 145.0 lb

## 2020-01-20 DIAGNOSIS — Z89612 Acquired absence of left leg above knee: Secondary | ICD-10-CM | POA: Diagnosis not present

## 2020-01-20 NOTE — Progress Notes (Signed)
Subjective:    Patient ID: Terrence Carr, male    DOB: 10-Mar-1961, 59 y.o.   MRN: [id number]  HPI  History obtained from patient and chart review.   Terrence Carr is a 59 year old man w/ PMH of HTN, who presents for transitional care follow-up after CIR admission for left AKA. Complication included left buttock stage 2 ulcer.   Medications include oxycodone and gabapentin for pain. He has pain at times. He believes he is taking the Oxycodone three times per day and the Gabapentin 100mg  TID.   He has been receiving home PT.   He does not need any medications refills.   Discussed that his BP is a little at high. He does not have a BP machine at home.   He had a hard BM this morning. He is taking the Miralax.   Pain Inventory Average Pain 9 Pain Right Now 0 My pain is sharp and aching  In the last 24 hours, has pain interfered with the following? General activity 0 Relation with others 0 Enjoyment of life 0 What TIME of day is your pain at its worst? ALL Sleep (in general) Fair  Pain is worse with: bending, sitting and some activites Pain improves with: rest Relief from Meds: 5  Mobility use a wheelchair  Function not employed: date last employed .  Neuro/Psych trouble walking spasms  Prior Studies Any changes since last visit?  no  Physicians involved in your care Any changes since last visit?  no   No family history on file. Social History   Socioeconomic History  . Marital status: Single    Spouse name: Not on file  . Number of children: Not on file  . Years of education: Not on file  . Highest education level: Not on file  Occupational History  . Not on file  Tobacco Use  . Smoking status: Never Smoker  . Smokeless tobacco: Never Used  Substance and Sexual Activity  . Alcohol use: Never  . Drug use: Never  . Sexual activity: Not on file  Other Topics Concern  . Not on file  Social History Narrative  . Not on file   Social Determinants of  Health   Financial Resource Strain:   . Difficulty of Paying Living Expenses: Not on file  Food Insecurity:   . Worried About Charity fundraiser in the Last Year: Not on file  . Ran Out of Food in the Last Year: Not on file  Transportation Needs:   . Lack of Transportation (Medical): Not on file  . Lack of Transportation (Non-Medical): Not on file  Physical Activity:   . Days of Exercise per Week: Not on file  . Minutes of Exercise per Session: Not on file  Stress:   . Feeling of Stress : Not on file  Social Connections:   . Frequency of Communication with Friends and Family: Not on file  . Frequency of Social Gatherings with Friends and Family: Not on file  . Attends Religious Services: Not on file  . Active Member of Clubs or Organizations: Not on file  . Attends Archivist Meetings: Not on file  . Marital Status: Not on file   Past Surgical History:  Procedure Laterality Date  . AMPUTATION Left 12/24/2019   Procedure: LEFT ABOVE KNEE AMPUTATION;  Surgeon: Rosetta Posner, MD;  Location: [facility];  Service: Vascular;  Laterality: Left;  . FEMORAL-POPLITEAL BYPASS GRAFT Right 12/30/2019   Procedure: RIGHT FEMORAL-POPLITEAL ARTERY BYPASS  GRAFT;  Surgeon: Waynetta Sandy, MD;  Location: [facility];  Service: Vascular;  Laterality: Right;  . LOWER EXTREMITY ANGIOGRAPHY Right 12/28/2019   Procedure: LOWER EXTREMITY ANGIOGRAPHY;  Surgeon: Waynetta Sandy, MD;  Location: [facility];  Service: Cardiovascular;  Laterality: Right;   Past Medical History:  Diagnosis Date  . Hypertension    There were no vitals taken for this visit.  Opioid Risk Score:   Fall Risk Score:  `1  Depression screen PHQ 2/9  No flowsheet data found.   Review of Systems  Constitutional: Negative.   HENT: Negative.   Eyes: Negative.   Respiratory: Negative.   Cardiovascular: Negative.   Gastrointestinal: Negative.   Endocrine: Negative.   Genitourinary: Negative.     Musculoskeletal: Positive for gait problem and myalgias.  Skin: Negative.   Allergic/Immunologic: Negative.   Hematological: Negative.   Psychiatric/Behavioral: Negative.   All other systems reviewed and are negative.      Objective:   Physical Exam  Vitals reviewed.  Constitutional: No distress.  Cachetic but looks better overall, better color/fuller face HENT:  Head: Normocephalic and atraumatic.  Mouth/Throat: No oropharyngeal exudate.  Patient with poor dentition. Eyes: EOMs intact Neck: No tracheal deviation present.  Cardiovascular:  RRR  Respiratory:  CTA B/L, no W/R/R  GI:  Soft, NT, ND, (+) BS Musculoskeletal:  Cervical back: Normal range of motion and neck supple.  Comments: RUE 4+/5- deltoid, biceps, triceps, WE, grip, finger abd LUE- deltoid 2+/5, otherwise 4+/5 in LUE RLE- at least 3+/5- wouldn't push secondary to "wounds" LLE- HF 3+/5- L AKA Neurological:. Sensation intact on L AKA Sensation to LT decreased below knee on RLE  Skin: No rash noted. He is not diaphoretic. No erythema. R groin incision appears well healed but has swelling, nontender to palpation.  Left AKA with staples in place no drainage, no erythema- looks great- (+) dog ears. Bypass sites lower extremities dressed with palpable pulses R shin wound and R top of foot have dressing- wounds bleeding through- Buttocks- on R buttock- multiple areas slightly open- like previous pimples or moisture? Sacrum slightly open- appears due to moisture- <1cm x 1cm; L buttock has pressure ulcer - superficially open- pink, with a little slough in middle- ~ 5-7 cm Dry skin along fem-pop bypass on RLE Psychiatric:  More aware      Assessment & Plan:  1. Decreased functional ability secondary to peripheral vascular disease status post left AKA 12/24/2019 as well as right femoral-popliteal bypass 12/30/2019  -patient may shower if L AKA is covered  -Continue home PT, OT, and nursing 2. Pain Management:  Neurontin 100 mg 3 times daily, oxycodone as needed  3. Skin/Wound Care- wounds on RLE, groin from R fempop bypass and Stage II on L buttocks: Routine skin checks/sacral foam prophylactic dressing change every third day- needs dressing changes for RLE as well. 4. Constipation: Continue Miralax daily.   20 minutes of face to face patient care time were spent during this visit. All questions were encouraged and answered.  Follow up with me PRN

## 2020-01-21 ENCOUNTER — Telehealth: Payer: Self-pay

## 2020-01-21 NOTE — Telephone Encounter (Signed)
Terrence Mercury, RN from [facility] called stating that patient right big toe and about 50% of foot is black, necrotic, cold to the touch and painful. Changed a lot since last week visit. Advise on what to do. I called Dr. Ranell Patrick to inform her and she advised patient to go to ED. Called Kim back to inform her to advise patient to go to ED at [facility] if possible. Terrence Carr will notified pt.

## 2020-01-22 ENCOUNTER — Ambulatory Visit (INDEPENDENT_AMBULATORY_CARE_PROVIDER_SITE_OTHER): Payer: Self-pay | Admitting: Physician Assistant

## 2020-01-22 ENCOUNTER — Encounter: Payer: Medicaid Other | Admitting: Vascular Surgery

## 2020-01-22 ENCOUNTER — Encounter: Payer: Medicaid Other | Admitting: Physical Medicine and Rehabilitation

## 2020-01-22 ENCOUNTER — Other Ambulatory Visit: Payer: Self-pay

## 2020-01-22 VITALS — BP 160/76 | HR 82 | Temp 97.8°F | Resp 18 | Ht 66.0 in | Wt 145.0 lb

## 2020-01-22 DIAGNOSIS — I739 Peripheral vascular disease, unspecified: Secondary | ICD-10-CM

## 2020-01-22 NOTE — Progress Notes (Signed)
POST OPERATIVE OFFICE NOTE    CC:  F/u for surgery  HPI:  This is a 59 y.o. male who was seen in consultation at [facility] on 12/24/19 for  acute onset and progressive worsening of wound to BLE for 3 weeks.  The left is worse than the right.  He has been soaking his wounds in Epsom salts.  S/P Left AKA 12/24/19.  Angiogram performed 12/28/19 :  Findings: Aorta and iliac segments are free of flow-limiting stenosis.  The right side common iliac artery has a stent that appears patent.  Common femoral artery has patent patch.  The SFA is flush occluded.  He reconstitutes above the knee popliteal artery which is diseased to below the knee.  Does have three-vessel runoff to the foot.   Followed by Procedure Performed: 1.  Reexposure right common femoral artery greater than 30 days 2.  Right common femoral profundofemoral endarterectomy 3.  Harvest right greater saphenous vein 4.  Right common femoral to below-knee popliteal artery bypass with ipsilateral, translocated and reversed greater saphenous vein  On 12/30/19 by Dr. Donzetta Matters.    S/P left AKA and Right common femoral to below-knee popliteal artery bypass with ipsilateral, translocated and reversed greater saphenous vein.  He is here today for staple removal of left AKA and right wound checks. Allergies  Allergen Reactions  . Ibuprofen Other (See Comments)    Patient said "the little brown pills" that he bought from the store made him talk out of his head (reported by [facility])  . Other Other (See Comments)    Unknown reaction to "Dihydroxyalaminum aminoacetate magnesium carbonate" (reported by [facility]    Current Outpatient Medications  Medication Sig Dispense Refill  . acetaminophen (TYLENOL) 325 MG tablet Take 1-2 tablets (325-650 mg total) by mouth every 4 (four) hours as needed for mild pain (or temp >/= 101 F).    Marland Kitchen aspirin EC 81 MG EC tablet Take 1 tablet (81 mg total) by mouth daily.    Marland Kitchen atorvastatin (LIPITOR)  20 MG tablet Take 1 tablet (20 mg total) by mouth daily at 6 PM. 30 tablet 1  . bisacodyl (DULCOLAX) 5 MG EC tablet Take 1 tablet (5 mg total) by mouth daily as needed for moderate constipation. 30 tablet 0  . buPROPion (WELLBUTRIN SR) 100 MG 12 hr tablet Take 1 tablet (100 mg total) by mouth 2 (two) times daily. 60 tablet 1  . cyanocobalamin 1000 MCG tablet Take 1 tablet (1,000 mcg total) by mouth daily. 30 tablet 1  . docusate sodium (COLACE) 100 MG capsule Take 1 capsule (100 mg total) by mouth 2 (two) times daily. 10 capsule 0  . folic acid (FOLVITE) 1 MG tablet Take 1 tablet (1 mg total) by mouth daily. 30 tablet 0  . gabapentin (NEURONTIN) 100 MG capsule Take 1 capsule (100 mg total) by mouth 3 (three) times daily. 90 capsule 1  . Multiple Vitamins-Minerals (THERA-M) TABS Take 1 tablet by mouth daily.    . naltrexone (DEPADE) 50 MG tablet Take 50 mg by mouth daily.    Marland Kitchen oxyCODONE (OXY IR/ROXICODONE) 5 MG immediate release tablet Take 1-2 tablets (5-10 mg total) by mouth every 4 (four) hours as needed for moderate pain. 30 tablet 0  . pantoprazole (PROTONIX) 40 MG tablet Take 1 tablet (40 mg total) by mouth daily. 30 tablet 0  . polyethylene glycol (MIRALAX / GLYCOLAX) 17 g packet Take 17 g by mouth daily as needed for mild constipation.  14 each 0   No current facility-administered medications for this visit.     ROS:  See HPI  Physical Exam:      Incision:  Left groin and LE incision healing well.  Dry gangrene above without change. Doppler signals DP/PT/peroneal intact right LE. Extremities:  Left AKA incision well healed staples removed patient tolrated this well   Assessment/Plan:  This is a 59 y.o. male who is s/p: S/P left AKA and Right common femoral to below-knee popliteal artery bypass with ipsilateral, translocated and reversed greater saphenous vein  He is not willing to proceed with amputation of the right LE at this time.  I explained that is he develops infection or  uncontrolled pain he will let us know.  He will f/u in 2-3 months for ABI's and wound checks.Roxy Horseman PA-C [facility] [phone]  Clinic MD:  Donzetta Matters

## 2020-01-25 ENCOUNTER — Inpatient Hospital Stay (HOSPITAL_COMMUNITY)
Admission: EM | Admit: 2020-01-25 | Discharge: 2020-01-28 | DRG: 240 | Disposition: A | Discharge status: Rehabilitation Facility | Payer: Medicaid Other | Attending: Vascular Surgery | Admitting: Vascular Surgery

## 2020-01-25 ENCOUNTER — Other Ambulatory Visit: Payer: Self-pay | Admitting: *Deleted

## 2020-01-25 ENCOUNTER — Other Ambulatory Visit: Payer: Self-pay

## 2020-01-25 ENCOUNTER — Encounter (HOSPITAL_COMMUNITY): Payer: Self-pay | Admitting: Emergency Medicine

## 2020-01-25 DIAGNOSIS — I10 Essential (primary) hypertension: Secondary | ICD-10-CM | POA: Diagnosis present

## 2020-01-25 DIAGNOSIS — R7881 Bacteremia: Secondary | ICD-10-CM | POA: Diagnosis present

## 2020-01-25 DIAGNOSIS — G4700 Insomnia, unspecified: Secondary | ICD-10-CM | POA: Diagnosis present

## 2020-01-25 DIAGNOSIS — R3915 Urgency of urination: Secondary | ICD-10-CM | POA: Diagnosis present

## 2020-01-25 DIAGNOSIS — E785 Hyperlipidemia, unspecified: Secondary | ICD-10-CM | POA: Diagnosis present

## 2020-01-25 DIAGNOSIS — R278 Other lack of coordination: Secondary | ICD-10-CM | POA: Diagnosis present

## 2020-01-25 DIAGNOSIS — Z720 Tobacco use: Secondary | ICD-10-CM | POA: Diagnosis not present

## 2020-01-25 DIAGNOSIS — Z20822 Contact with and (suspected) exposure to covid-19: Secondary | ICD-10-CM | POA: Diagnosis present

## 2020-01-25 DIAGNOSIS — Z4781 Encounter for orthopedic aftercare following surgical amputation: Secondary | ICD-10-CM | POA: Diagnosis present

## 2020-01-25 DIAGNOSIS — T82868A Thrombosis of vascular prosthetic devices, implants and grafts, initial encounter: Principal | ICD-10-CM | POA: Diagnosis present

## 2020-01-25 DIAGNOSIS — I739 Peripheral vascular disease, unspecified: Secondary | ICD-10-CM

## 2020-01-25 DIAGNOSIS — K5900 Constipation, unspecified: Secondary | ICD-10-CM | POA: Diagnosis present

## 2020-01-25 DIAGNOSIS — F17200 Nicotine dependence, unspecified, uncomplicated: Secondary | ICD-10-CM | POA: Diagnosis present

## 2020-01-25 DIAGNOSIS — I96 Gangrene, not elsewhere classified: Secondary | ICD-10-CM | POA: Diagnosis present

## 2020-01-25 DIAGNOSIS — E538 Deficiency of other specified B group vitamins: Secondary | ICD-10-CM | POA: Diagnosis present

## 2020-01-25 DIAGNOSIS — I998 Other disorder of circulatory system: Secondary | ICD-10-CM | POA: Diagnosis present

## 2020-01-25 DIAGNOSIS — B9561 Methicillin susceptible Staphylococcus aureus infection as the cause of diseases classified elsewhere: Secondary | ICD-10-CM | POA: Diagnosis present

## 2020-01-25 DIAGNOSIS — Y832 Surgical operation with anastomosis, bypass or graft as the cause of abnormal reaction of the patient, or of later complication, without mention of misadventure at the time of the procedure: Secondary | ICD-10-CM | POA: Diagnosis present

## 2020-01-25 DIAGNOSIS — Z7141 Alcohol abuse counseling and surveillance of alcoholic: Secondary | ICD-10-CM | POA: Diagnosis not present

## 2020-01-25 DIAGNOSIS — D62 Acute posthemorrhagic anemia: Secondary | ICD-10-CM | POA: Diagnosis present

## 2020-01-25 DIAGNOSIS — Z888 Allergy status to other drugs, medicaments and biological substances status: Secondary | ICD-10-CM | POA: Diagnosis not present

## 2020-01-25 DIAGNOSIS — Z716 Tobacco abuse counseling: Secondary | ICD-10-CM | POA: Diagnosis not present

## 2020-01-25 DIAGNOSIS — Z8673 Personal history of transient ischemic attack (TIA), and cerebral infarction without residual deficits: Secondary | ICD-10-CM | POA: Diagnosis not present

## 2020-01-25 DIAGNOSIS — Z7982 Long term (current) use of aspirin: Secondary | ICD-10-CM | POA: Diagnosis not present

## 2020-01-25 DIAGNOSIS — B9562 Methicillin resistant Staphylococcus aureus infection as the cause of diseases classified elsewhere: Secondary | ICD-10-CM | POA: Diagnosis present

## 2020-01-25 DIAGNOSIS — Z89611 Acquired absence of right leg above knee: Secondary | ICD-10-CM | POA: Diagnosis not present

## 2020-01-25 DIAGNOSIS — Z79899 Other long term (current) drug therapy: Secondary | ICD-10-CM

## 2020-01-25 DIAGNOSIS — Z89512 Acquired absence of left leg below knee: Secondary | ICD-10-CM | POA: Diagnosis not present

## 2020-01-25 DIAGNOSIS — Z89612 Acquired absence of left leg above knee: Secondary | ICD-10-CM

## 2020-01-25 DIAGNOSIS — L299 Pruritus, unspecified: Secondary | ICD-10-CM | POA: Diagnosis not present

## 2020-01-25 DIAGNOSIS — Z886 Allergy status to analgesic agent status: Secondary | ICD-10-CM | POA: Diagnosis not present

## 2020-01-25 LAB — URINALYSIS, ROUTINE W REFLEX MICROSCOPIC
Bacteria, UA: NONE SEEN
Bilirubin Urine: NEGATIVE
Glucose, UA: NEGATIVE mg/dL
Ketones, ur: NEGATIVE mg/dL
Leukocytes,Ua: NEGATIVE
Nitrite: NEGATIVE
Protein, ur: NEGATIVE mg/dL
Specific Gravity, Urine: 1.029 (ref 1.005–1.030)
pH: 6 (ref 5.0–8.0)

## 2020-01-25 LAB — COMPREHENSIVE METABOLIC PANEL
ALT: 12 U/L (ref 0–44)
AST: 13 U/L — ABNORMAL LOW (ref 15–41)
Albumin: 1.9 g/dL — ABNORMAL LOW (ref 3.5–5.0)
Alkaline Phosphatase: 124 U/L (ref 38–126)
Anion gap: 11 (ref 5–15)
BUN: 6 mg/dL (ref 6–20)
CO2: 22 mmol/L (ref 22–32)
Calcium: 8.9 mg/dL (ref 8.9–10.3)
Chloride: 102 mmol/L (ref 98–111)
Creatinine, Ser: 0.92 mg/dL (ref 0.61–1.24)
GFR calc Af Amer: >60 mL/min (ref >60)
GFR calc non Af Amer: >60 mL/min (ref >60)
Glucose, Bld: 141 mg/dL — ABNORMAL HIGH (ref 70–99)
Potassium: 4 mmol/L (ref 3.5–5.1)
Sodium: 135 mmol/L (ref 135–145)
Total Bilirubin: <0.1 mg/dL — ABNORMAL LOW (ref 0.3–1.2)
Total Protein: 7.6 g/dL (ref 6.5–8.1)

## 2020-01-25 LAB — PROTIME-INR
INR: 1.1 (ref 0.8–1.2)
Prothrombin Time: 14.1 s (ref 11.4–15.2)

## 2020-01-25 LAB — CBC WITH DIFFERENTIAL/PLATELET
Abs Immature Granulocytes: 0.08 K/uL — ABNORMAL HIGH (ref 0.00–0.07)
Basophils Absolute: 0.1 K/uL (ref 0.0–0.1)
Basophils Relative: 1 %
Eosinophils Absolute: 0.2 K/uL (ref 0.0–0.5)
Eosinophils Relative: 1 %
HCT: 18.3 % — ABNORMAL LOW (ref 39.0–52.0)
Hemoglobin: 5.6 g/dL — CL (ref 13.0–17.0)
Immature Granulocytes: 1 %
Lymphocytes Relative: 12 %
Lymphs Abs: 1.4 K/uL (ref 0.7–4.0)
MCH: 29.2 pg (ref 26.0–34.0)
MCHC: 30.6 g/dL (ref 30.0–36.0)
MCV: 95.3 fL (ref 80.0–100.0)
Monocytes Absolute: 1.2 K/uL — ABNORMAL HIGH (ref 0.1–1.0)
Monocytes Relative: 9 %
Neutro Abs: 9.5 K/uL — ABNORMAL HIGH (ref 1.7–7.7)
Neutrophils Relative %: 76 %
Platelets: 1170 K/uL (ref 150–400)
RBC: 1.92 MIL/uL — ABNORMAL LOW (ref 4.22–5.81)
RDW: 16.9 % — ABNORMAL HIGH (ref 11.5–15.5)
WBC: 12.4 K/uL — ABNORMAL HIGH (ref 4.0–10.5)
nRBC: 0 % (ref 0.0–0.2)

## 2020-01-25 LAB — LACTIC ACID, PLASMA: Lactic Acid, Venous: 2.1 mmol/L (ref 0.5–1.9)

## 2020-01-25 LAB — RESPIRATORY PANEL BY RT PCR (FLU A&B, COVID)
Influenza A by PCR: NEGATIVE
Influenza B by PCR: NEGATIVE
SARS Coronavirus 2 by RT PCR: NEGATIVE

## 2020-01-25 LAB — PREPARE RBC (CROSSMATCH)

## 2020-01-25 MED ORDER — LABETALOL HCL 5 MG/ML IV SOLN: 10.0000 mg | INTRAVENOUS | Status: DC | PRN

## 2020-01-25 MED ORDER — ACETAMINOPHEN 325 MG PO TABS
325.0000 mg | ORAL_TABLET | ORAL | Status: DC | PRN
  Administered 2020-01-26 – 2020-01-28 (×8): 650 mg via ORAL
  Filled 2020-01-25 (×8): qty 2

## 2020-01-25 MED ORDER — PHENOL 1.4 % MT LIQD: 1.0000 | OROMUCOSAL | Status: DC | PRN

## 2020-01-25 MED ORDER — SODIUM CHLORIDE 0.9 % IV SOLN: 500.0000 mL | Freq: Once | INTRAVENOUS | Status: DC | PRN

## 2020-01-25 MED ORDER — FOLIC ACID 1 MG PO TABS
1.0000 mg | ORAL_TABLET | Freq: Every day | ORAL | Status: DC
  Administered 2020-01-27 – 2020-01-28 (×2): 1 mg via ORAL
  Filled 2020-01-25 (×3): qty 1

## 2020-01-25 MED ORDER — HEPARIN (PORCINE) 25000 UT/250ML-% IV SOLN: 1000.0000 [IU]/h | INTRAVENOUS | Status: DC

## 2020-01-25 MED ORDER — ACETAMINOPHEN 325 MG RE SUPP: 325.0000 mg | RECTAL | Status: DC | PRN

## 2020-01-25 MED ORDER — SODIUM CHLORIDE 0.9 % IV SOLN: INTRAVENOUS | Status: DC

## 2020-01-25 MED ORDER — GABAPENTIN 100 MG PO CAPS
100.0000 mg | ORAL_CAPSULE | Freq: Three times a day (TID) | ORAL | Status: DC
  Administered 2020-01-26 – 2020-01-28 (×7): 100 mg via ORAL
  Filled 2020-01-25 (×8): qty 1

## 2020-01-25 MED ORDER — CEFAZOLIN SODIUM-DEXTROSE 2-4 GM/100ML-% IV SOLN
2.0000 g | Freq: Three times a day (TID) | INTRAVENOUS | Status: AC
  Administered 2020-01-26 (×2): 2 g via INTRAVENOUS
  Filled 2020-01-25 (×2): qty 100

## 2020-01-25 MED ORDER — VITAMIN B-12 1000 MCG PO TABS
1000.0000 ug | ORAL_TABLET | Freq: Every day | ORAL | Status: DC
  Administered 2020-01-27 – 2020-01-28 (×2): 1000 ug via ORAL
  Filled 2020-01-25 (×3): qty 1

## 2020-01-25 MED ORDER — BUPROPION HCL ER (SR) 100 MG PO TB12
100.0000 mg | ORAL_TABLET | Freq: Two times a day (BID) | ORAL | Status: DC
  Administered 2020-01-26 – 2020-01-28 (×5): 100 mg via ORAL
  Filled 2020-01-25 (×7): qty 1

## 2020-01-25 MED ORDER — HYDRALAZINE HCL 20 MG/ML IJ SOLN: 5.0000 mg | INTRAMUSCULAR | Status: DC | PRN

## 2020-01-25 MED ORDER — SODIUM CHLORIDE 0.9% IV SOLUTION: Freq: Once | INTRAVENOUS | Status: AC

## 2020-01-25 MED ORDER — DOCUSATE SODIUM 100 MG PO CAPS
100.0000 mg | ORAL_CAPSULE | Freq: Every day | ORAL | Status: DC
  Administered 2020-01-27 – 2020-01-28 (×2): 100 mg via ORAL
  Filled 2020-01-25 (×3): qty 1

## 2020-01-25 MED ORDER — ATORVASTATIN CALCIUM 10 MG PO TABS
20.0000 mg | ORAL_TABLET | Freq: Every day | ORAL | Status: DC
  Administered 2020-01-26 – 2020-01-27 (×2): 20 mg via ORAL
  Filled 2020-01-25 (×2): qty 2

## 2020-01-25 MED ORDER — OXYCODONE HCL 5 MG PO TABS
5.0000 mg | ORAL_TABLET | ORAL | Status: DC | PRN
  Filled 2020-01-25 (×2): qty 2

## 2020-01-25 MED ORDER — ALUM & MAG HYDROXIDE-SIMETH 200-200-20 MG/5ML PO SUSP: 15.0000 mL | ORAL | Status: DC | PRN

## 2020-01-25 MED ORDER — ONDANSETRON HCL 4 MG/2ML IJ SOLN: 4.0000 mg | Freq: Four times a day (QID) | INTRAMUSCULAR | Status: DC | PRN

## 2020-01-25 MED ORDER — MORPHINE SULFATE (PF) 4 MG/ML IV SOLN: 4.0000 mg | Freq: Once | INTRAVENOUS | Status: DC

## 2020-01-25 MED ORDER — HEPARIN SODIUM (PORCINE) 5000 UNIT/ML IJ SOLN: 5000.0000 [IU] | Freq: Three times a day (TID) | INTRAMUSCULAR | Status: DC

## 2020-01-25 MED ORDER — GUAIFENESIN-DM 100-10 MG/5ML PO SYRP: 15.0000 mL | ORAL_SOLUTION | ORAL | Status: DC | PRN

## 2020-01-25 MED ORDER — ASPIRIN EC 81 MG PO TBEC
81.0000 mg | DELAYED_RELEASE_TABLET | Freq: Every day | ORAL | Status: DC
  Administered 2020-01-27 – 2020-01-28 (×2): 81 mg via ORAL
  Filled 2020-01-25 (×3): qty 1

## 2020-01-25 MED ORDER — METOPROLOL TARTRATE 5 MG/5ML IV SOLN: 2.0000 mg | INTRAVENOUS | Status: DC | PRN

## 2020-01-25 NOTE — ED Triage Notes (Signed)
Pt in via [facility] as transfer from [place] for a fem-pop bypass for R leg arterial occlusion. Pt has no palpated R pedal pulse, R great toe black, has plans for vascular surgery with Dr. Carlis Abbott. Hx of L AKA 3 wks ago

## 2020-01-25 NOTE — Progress Notes (Signed)
Critical HGB 5.6 and platelets 1,170 was resulted in ed, paged Dr Carlis Abbott to notify, 2 unit PRBC ordered will continue to monitor

## 2020-01-25 NOTE — H&P (Signed)
Hospital Consult     MRN #:  [id number]  History of Present Illness: This is a 59 y.o. male with history of hypertension and tobacco abuse that presents as a transfer from [facility] with concern for acute on chronic limb ischemia of the right lower extremity.  He presented to [facility] earlier today and unable to find signals.  He is well-known to the vascular surgery service here.  He was initially seen in consultation by Dr. Donnetta Hutching and underwent a left above-knee amputation on 12/24/2019 for gangrene of the left foot.  Subsequently underwent right lower extremity arteriogram and attempt at limb salvage for the right lower extremity tissue loss.  He had a right common femoral endarterectomy with right common femoral to below-knee popliteal bypass with saphenous vein on 12/30/2019 with Dr. Donzetta Matters for critical limb ischemia with tissue loss.  He was seen in clinic on Friday and had fairly extensive gangrene of the foot and does not remember much about the visit.  Documentation is he did not want an amputation at that time.  Ultimately went to [facility] today states because he noticed his foot was turning colors.  Still smoking.  States his foot is painful as well.  Past Medical History:  Diagnosis Date  . Hypertension     Past Surgical History:  Procedure Laterality Date  . AMPUTATION Left 12/24/2019   Procedure: LEFT ABOVE KNEE AMPUTATION;  Surgeon: Rosetta Posner, MD;  Location: [facility];  Service: Vascular;  Laterality: Left;  . FEMORAL-POPLITEAL BYPASS GRAFT Right 12/30/2019   Procedure: RIGHT FEMORAL-POPLITEAL ARTERY BYPASS GRAFT;  Surgeon: Waynetta Sandy, MD;  Location: [facility];  Service: Vascular;  Laterality: Right;  . LOWER EXTREMITY ANGIOGRAPHY Right 12/28/2019   Procedure: LOWER EXTREMITY ANGIOGRAPHY;  Surgeon: Waynetta Sandy, MD;  Location: [facility];  Service: Cardiovascular;  Laterality: Right;    Allergies  Allergen Reactions  . Ibuprofen  Other (See Comments)    Patient said "the little brown pills" that he bought from the store made him talk out of his head (reported by [facility])  . Other Other (See Comments)    Unknown reaction to "Dihydroxyalaminum aminoacetate magnesium carbonate" (reported by [facility]    Prior to Admission medications   Medication Sig Start Date End Date Taking? Authorizing Provider  acetaminophen (TYLENOL) 325 MG tablet Take 1-2 tablets (325-650 mg total) by mouth every 4 (four) hours as needed for mild pain (or temp >/= 101 F). 12/31/19   Pokhrel, Corrie Mckusick, MD  aspirin EC 81 MG EC tablet Take 1 tablet (81 mg total) by mouth daily. 01/01/20   Pokhrel, Corrie Mckusick, MD  atorvastatin (LIPITOR) 20 MG tablet Take 1 tablet (20 mg total) by mouth daily at 6 PM. 01/07/20   Angiulli, Lavon Paganini, PA-C  bisacodyl (DULCOLAX) 5 MG EC tablet Take 1 tablet (5 mg total) by mouth daily as needed for moderate constipation. 01/07/20   Angiulli, Lavon Paganini, PA-C  buPROPion (WELLBUTRIN SR) 100 MG 12 hr tablet Take 1 tablet (100 mg total) by mouth 2 (two) times daily. 01/07/20   Angiulli, Lavon Paganini, PA-C  cyanocobalamin 1000 MCG tablet Take 1 tablet (1,000 mcg total) by mouth daily. 01/07/20   Angiulli, Lavon Paganini, PA-C  docusate sodium (COLACE) 100 MG capsule Take 1 capsule (100 mg total) by mouth 2 (two) times daily. 12/31/19   Pokhrel, Corrie Mckusick, MD  folic acid (FOLVITE) 1 MG tablet Take 1 tablet (1 mg total) by mouth daily. 01/07/20  Angiulli, Lavon Paganini, PA-C  gabapentin (NEURONTIN) 100 MG capsule Take 1 capsule (100 mg total) by mouth 3 (three) times daily. 01/07/20   Angiulli, Lavon Paganini, PA-C  Multiple Vitamins-Minerals (THERA-M) TABS Take 1 tablet by mouth daily. 09/11/19   [provider]  naltrexone (DEPADE) 50 MG tablet Take 50 mg by mouth daily. 09/11/19   [provider]  oxyCODONE (OXY IR/ROXICODONE) 5 MG immediate release tablet Take 1-2 tablets (5-10 mg total) by mouth every 4 (four) hours as needed for moderate  pain. 01/07/20   Angiulli, Lavon Paganini, PA-C  pantoprazole (PROTONIX) 40 MG tablet Take 1 tablet (40 mg total) by mouth daily. 01/07/20   Angiulli, Lavon Paganini, PA-C  polyethylene glycol (MIRALAX / GLYCOLAX) 17 g packet Take 17 g by mouth daily as needed for mild constipation. 12/31/19   Flora Lipps, MD    Social History   Socioeconomic History  . Marital status: Single    Spouse name: Not on file  . Number of children: Not on file  . Years of education: Not on file  . Highest education level: Not on file  Occupational History  . Not on file  Tobacco Use  . Smoking status: Never Smoker  . Smokeless tobacco: Never Used  Substance and Sexual Activity  . Alcohol use: Never  . Drug use: Never  . Sexual activity: Not on file  Other Topics Concern  . Not on file  Social History Narrative  . Not on file   Social Determinants of Health   Financial Resource Strain:   . Difficulty of Paying Living Expenses: Not on file  Food Insecurity:   . Worried About Charity fundraiser in the Last Year: Not on file  . Ran Out of Food in the Last Year: Not on file  Transportation Needs:   . Lack of Transportation (Medical): Not on file  . Lack of Transportation (Non-Medical): Not on file  Physical Activity:   . Days of Exercise per Week: Not on file  . Minutes of Exercise per Session: Not on file  Stress:   . Feeling of Stress : Not on file  Social Connections:   . Frequency of Communication with Friends and Family: Not on file  . Frequency of Social Gatherings with Friends and Family: Not on file  . Attends Religious Services: Not on file  . Active Member of Clubs or Organizations: Not on file  . Attends Archivist Meetings: Not on file  . Marital Status: Not on file  Intimate Partner Violence:   . Fear of Current or Ex-Partner: Not on file  . Emotionally Abused: Not on file  . Physically Abused: Not on file  . Sexually Abused: Not on file     No family history on file.  ROS:  [x]  Positive   [ ]  Negative   [ ]  All sytems reviewed and are negative  Cardiovascular: []  chest pain/pressure []  palpitations []  SOB lying flat []  DOE []  pain in legs while walking []  pain in legs at rest []  pain in legs at night []  non-healing ulcers []  hx of DVT []  swelling in legs  Pulmonary: []  productive cough []  asthma/wheezing []  home O2  Neurologic: []  weakness in []  arms []  legs []  numbness in []  arms []  legs []  hx of CVA []  mini stroke [] difficulty speaking or slurred speech []  temporary loss of vision in one eye []  dizziness  Hematologic: []  hx of cancer []  bleeding problems []  problems with blood  clotting easily  Endocrine:   []  diabetes []  thyroid disease  GI []  vomiting blood []  blood in stool  GU: []  CKD/renal failure []  HD--[]  M/W/F or []  T/T/S []  burning with urination []  blood in urine  Psychiatric: []  anxiety []  depression  Musculoskeletal: []  arthritis []  joint pain  Integumentary: []  rashes []  ulcers  Constitutional: []  fever []  chills   Physical Examination  Vitals:   01/25/20 1858  SpO2: 100%   Body mass index is 23.41 kg/m.  General:  NAD HENT: WNL, normocephalic Pulmonary: normal non-labored breathing, without Rales, rhonchi,  wheezing Cardiac: regular, without  Murmurs, rubs or gallops Abdomen: soft, NT/ND, no masses Vascular Exam/Pulses: Right groin incision healed Palpable right femoral pulse No right pedal pulses palable Dry gangrene as pictured below - exposed bone and tendon on right great metatarsal - necrosis extends fair amount on plantar surface Musculoskeletal: no muscle wasting or atrophy  Neurologic: Unable to wiggle right toes.      CBC    Component Value Date/Time   WBC 8.3 01/04/2020 0614   RBC 2.56 (L) 01/04/2020 0614   HGB 8.1 (L) 01/04/2020 0614   HCT 24.6 (L) 01/04/2020 0614   HCT 24.7 (L) 12/24/2019 0419   PLT 863 (H) 01/04/2020 0614   MCV 96.1 01/04/2020 0614   MCH 31.6  01/04/2020 0614   MCHC 32.9 01/04/2020 0614   RDW 16.3 (H) 01/04/2020 0614   LYMPHSABS 1.5 01/04/2020 0614   MONOABS 0.9 01/04/2020 0614   EOSABS 0.2 01/04/2020 0614   BASOSABS 0.1 01/04/2020 0614    BMET    Component Value Date/Time   NA 134 (L) 01/04/2020 0614   K 4.2 01/04/2020 0614   CL 99 01/04/2020 0614   CO2 25 01/04/2020 0614   GLUCOSE 101 (H) 01/04/2020 0614   BUN 9 01/04/2020 0614   CREATININE 0.80 01/04/2020 0614   CALCIUM 8.4 (L) 01/04/2020 0614   GFRNONAA >60 01/04/2020 0614   GFRAA >60 01/04/2020 0614    COAGS: Lab Results  Component Value Date   INR 0.9 12/30/2019   INR 1.0 12/28/2019     Non-Invasive Vascular Imaging:    None   ASSESSMENT/PLAN: This is a 59 y.o. male that presents with worsening gangrene of the right foot/leg in the setting of critical limb ischemia.  He recently underwent right femoral to below-knee popliteal bypass on 12/30/2019 with Dr. Donzetta Matters for limb salvage.  I reviewed his CTA from [facility] and it appears that his bypass is thrombosed.  I do not think his right leg is salvageable at this time given the extent of tissue loss extending well onto the plantar surface of his foot and the amount of debridement he would require even with additional re-vascularization.  I have recommended a right above-knee amputation.  Patient is amendable to this.  We will keep him n.p.o. after midnight.  He will need Covid test which has been ordered.  May stop heparin drip.  I will post him for the OR tomorrow.  Marty Heck, MD [facility] [place] Office: [phone] Pager: [phone]

## 2020-01-25 NOTE — ED Provider Notes (Signed)
[place] EMERGENCY DEPARTMENT Provider Note   CSN: [id number] Arrival date & time: 01/25/20  1854     History Chief Complaint  Patient presents with  . [facility] [place]/clotted fem/pop    Terrence Carr is a 59 y.o. male.  HPI  Patient is sent via EMS for transfer from [facility] for a clotted right femoral-popliteal bypass graft that was done 12\30\2020 by Dr. Donzetta Matters.  Dr. Carlis Abbott had been consulted at [facility] and is aware of the patient.  Patient had gangrene of the left foot and had AKA on 12\24\2020.  The bypass graft was attempted for critical limb ischemia with tissue loss.  Patient reports that the pain got significantly worse last night at about 1 AM.  He reports he got severe throbbing pain in the foot.  He had been seen in the vascular clinic 4 days ago and at that time noted to have fairly extensive gangrene of the foot.  Patient denies fever or chills.  He denies nausea or vomiting.  He denies coughing or shortness of breath.  He does continue to be a heavy smoker.    Past Medical History:  Diagnosis Date  . Hypertension     Patient Active Problem List   Diagnosis Date Noted  . Pressure ulcer of left buttock, stage 2 ([place]) 01/01/2020  . Left above-knee amputee ([place]) 12/31/2019  . S/P AKA (above knee amputation) unilateral, left ([place])   . Osteomyelitis of multiple sites ([place])   . Thrombocytosis ([place]) 12/24/2019  . Hypoglycemia 12/24/2019  . Osteomyelitis of ankle or foot, acute, right ([place]) 12/23/2019  . Lactic acidosis 12/23/2019  . Alcohol abuse 12/23/2019  . Alcohol withdrawal ([place]) 12/23/2019  . Macrocytic anemia 12/23/2019  . Sacral decubitus ulcer 12/23/2019  . Hyperglycemia 12/23/2019  . Essential hypertension 12/23/2019  . Peripheral arterial disease ([place]) 12/23/2019  . Vitamin B12 deficiency 12/23/2019    Past Surgical History:  Procedure Laterality Date  . AMPUTATION Left 12/24/2019   Procedure: LEFT ABOVE KNEE  AMPUTATION;  Surgeon: Rosetta Posner, MD;  Location: [place];  Service: Vascular;  Laterality: Left;  . FEMORAL-POPLITEAL BYPASS GRAFT Right 12/30/2019   Procedure: RIGHT FEMORAL-POPLITEAL ARTERY BYPASS GRAFT;  Surgeon: Waynetta Sandy, MD;  Location: [facility];  Service: Vascular;  Laterality: Right;  . LOWER EXTREMITY ANGIOGRAPHY Right 12/28/2019   Procedure: LOWER EXTREMITY ANGIOGRAPHY;  Surgeon: Waynetta Sandy, MD;  Location: [facility];  Service: Cardiovascular;  Laterality: Right;       No family history on file.  Social History   Tobacco Use  . Smoking status: Never Smoker  . Smokeless tobacco: Never Used  Substance Use Topics  . Alcohol use: Never  . Drug use: Never    Home Medications Prior to Admission medications   Medication Sig Start Date End Date Taking? Authorizing Provider  acetaminophen (TYLENOL) 325 MG tablet Take 1-2 tablets (325-650 mg total) by mouth every 4 (four) hours as needed for mild pain (or temp >/= 101 F). 12/31/19   Pokhrel, Corrie Mckusick, MD  aspirin EC 81 MG EC tablet Take 1 tablet (81 mg total) by mouth daily. 01/01/20   Pokhrel, Corrie Mckusick, MD  atorvastatin (LIPITOR) 20 MG tablet Take 1 tablet (20 mg total) by mouth daily at 6 PM. 01/07/20   Angiulli, Lavon Paganini, PA-C  bisacodyl (DULCOLAX) 5 MG EC tablet Take 1 tablet (5 mg total) by mouth daily as needed for moderate constipation. 01/07/20   Angiulli, Lavon Paganini, PA-C  buPROPion (WELLBUTRIN SR) 100 MG 12 hr tablet Take 1 tablet (100 mg total) by mouth 2 (two) times daily. 01/07/20   Angiulli, Lavon Paganini, PA-C  cyanocobalamin 1000 MCG tablet Take 1 tablet (1,000 mcg total) by mouth daily. 01/07/20   Angiulli, Lavon Paganini, PA-C  docusate sodium (COLACE) 100 MG capsule Take 1 capsule (100 mg total) by mouth 2 (two) times daily. 12/31/19   Pokhrel, Corrie Mckusick, MD  folic acid (FOLVITE) 1 MG tablet Take 1 tablet (1 mg total) by mouth daily. 01/07/20   Angiulli, Lavon Paganini, PA-C  gabapentin (NEURONTIN) 100 MG capsule Take  1 capsule (100 mg total) by mouth 3 (three) times daily. 01/07/20   Angiulli, Lavon Paganini, PA-C  Multiple Vitamins-Minerals (THERA-M) TABS Take 1 tablet by mouth daily. 09/11/19   [provider]  naltrexone (DEPADE) 50 MG tablet Take 50 mg by mouth daily. 09/11/19   [provider]  oxyCODONE (OXY IR/ROXICODONE) 5 MG immediate release tablet Take 1-2 tablets (5-10 mg total) by mouth every 4 (four) hours as needed for moderate pain. 01/07/20   Angiulli, Lavon Paganini, PA-C  pantoprazole (PROTONIX) 40 MG tablet Take 1 tablet (40 mg total) by mouth daily. 01/07/20   Angiulli, Lavon Paganini, PA-C  polyethylene glycol (MIRALAX / GLYCOLAX) 17 g packet Take 17 g by mouth daily as needed for mild constipation. 12/31/19   Pokhrel, Corrie Mckusick, MD    Allergies    Ibuprofen and Other  Review of Systems   Review of Systems 10 Systems reviewed and are negative for acute change except as noted in the HPI.  Physical Exam Updated Vital Signs There were no vitals taken for this visit.  Physical Exam Constitutional:      Comments: Patient is alert and nontoxic.  No respiratory distress.  HENT:     Head: Normocephalic and atraumatic.  Eyes:     Extraocular Movements: Extraocular movements intact.  Cardiovascular:     Rate and Rhythm: Normal rate and regular rhythm.  Pulmonary:     Comments: No respiratory distress.  Breath sounds are soft at the bases.  No gross wheeze or crackle. Abdominal:     General: There is no distension.     Palpations: Abdomen is soft.  Musculoskeletal:     Comments: Patient has a well-healed AKA on the left.  Right limb is shown in attached images.  There are ulcerations in severe appearing gangrene of the great toe and medial portion of the forefoot.  Skin:    General: Skin is warm and dry.  Neurological:     General: No focal deficit present.     Mental Status: He is oriented to person, place, and time.     Coordination: Coordination normal.     Comments: Patient speech is  clear.  His cognitive function is normal.  No signs of neurologic dysfunction.  Psychiatric:        Mood and Affect: Mood normal.           ED Results / Procedures / Treatments   Labs (all labs ordered are listed, but only abnormal results are displayed) Labs Reviewed - No data to display  EKG None  Radiology No results found.  Procedures Procedures (including critical care time)  Medications Ordered in ED Medications - No data to display  ED Course  I have reviewed the triage vital signs and the nursing notes.  Pertinent labs & imaging results that were available during my care of the patient were reviewed by me and  considered in my medical decision making (see chart for details).    MDM Rules/Calculators/A&P                     Consult: Dr. Carlis Abbott vascular surgery has evaluated the patient in the emergency department.  He will admit for definitive management.  At this time, it appears the limb will require amputation.  Patient presents as aligned above.  He is alert and nontoxic.  No fever.  Basic lab work and [employer] testing obtained.  Patient is given morphine for pain control.  He will be admitted to vascular surgery service for definitive management of gangrenous extremity. Final Clinical Impression(s) / ED Diagnoses Final diagnoses:  Gangrene due to peripheral vascular disease [facility])    Rx / DC Orders ED Discharge Orders    None       Charlesetta Shanks, MD 01/25/20 2105

## 2020-01-26 ENCOUNTER — Inpatient Hospital Stay (HOSPITAL_COMMUNITY): Payer: Medicaid Other | Admitting: Anesthesiology

## 2020-01-26 ENCOUNTER — Encounter (HOSPITAL_COMMUNITY)
Admission: EM | Disposition: A | Discharge status: Rehabilitation Facility | Payer: Self-pay | Source: Home / Self Care | Attending: Vascular Surgery

## 2020-01-26 ENCOUNTER — Encounter (HOSPITAL_COMMUNITY): Payer: Self-pay | Admitting: Vascular Surgery

## 2020-01-26 DIAGNOSIS — I96 Gangrene, not elsewhere classified: Secondary | ICD-10-CM

## 2020-01-26 HISTORY — PX: AMPUTATION: SHX166

## 2020-01-26 LAB — POCT I-STAT, CHEM 8
BUN: 4 mg/dL — ABNORMAL LOW (ref 6–20)
Calcium, Ion: 1.11 mmol/L — ABNORMAL LOW (ref 1.15–1.40)
Chloride: 103 mmol/L (ref 98–111)
Creatinine, Ser: 0.6 mg/dL — ABNORMAL LOW (ref 0.61–1.24)
Glucose, Bld: 100 mg/dL — ABNORMAL HIGH (ref 70–99)
HCT: 24 % — ABNORMAL LOW (ref 39.0–52.0)
Hemoglobin: 8.2 g/dL — ABNORMAL LOW (ref 13.0–17.0)
Potassium: 4.1 mmol/L (ref 3.5–5.1)
Sodium: 134 mmol/L — ABNORMAL LOW (ref 135–145)
TCO2: 23 mmol/L (ref 22–32)

## 2020-01-26 LAB — SURGICAL PCR SCREEN
MRSA, PCR: POSITIVE — AB
Staphylococcus aureus: POSITIVE — AB

## 2020-01-26 LAB — GLUCOSE, CAPILLARY: Glucose-Capillary: 157 mg/dL — ABNORMAL HIGH (ref 70–99)

## 2020-01-26 LAB — PATHOLOGIST SMEAR REVIEW

## 2020-01-26 LAB — HEMOGLOBIN AND HEMATOCRIT, BLOOD
HCT: 26.3 % — ABNORMAL LOW (ref 39.0–52.0)
Hemoglobin: 8.6 g/dL — ABNORMAL LOW (ref 13.0–17.0)

## 2020-01-26 SURGERY — AMPUTATION, ABOVE KNEE
Anesthesia: General | Site: Leg Upper | Laterality: Right

## 2020-01-26 MED ORDER — POLYETHYLENE GLYCOL 3350 17 G PO PACK: 17.0000 g | PACK | Freq: Every day | ORAL | Status: DC | PRN

## 2020-01-26 MED ORDER — PROPOFOL 10 MG/ML IV BOLUS
INTRAVENOUS | Status: DC | PRN
  Administered 2020-01-26: 130 mg via INTRAVENOUS

## 2020-01-26 MED ORDER — HEPARIN SODIUM (PORCINE) 5000 UNIT/ML IJ SOLN
5000.0000 [IU] | Freq: Three times a day (TID) | INTRAMUSCULAR | Status: DC
  Administered 2020-01-27 – 2020-01-28 (×5): 5000 [IU] via SUBCUTANEOUS
  Filled 2020-01-26 (×5): qty 1

## 2020-01-26 MED ORDER — ONDANSETRON HCL 4 MG/2ML IJ SOLN
INTRAMUSCULAR | Status: AC
  Filled 2020-01-26: qty 2

## 2020-01-26 MED ORDER — MIDAZOLAM HCL 2 MG/2ML IJ SOLN
INTRAMUSCULAR | Status: DC | PRN
  Administered 2020-01-26: 1 mg via INTRAVENOUS

## 2020-01-26 MED ORDER — DEXAMETHASONE SODIUM PHOSPHATE 10 MG/ML IJ SOLN
INTRAMUSCULAR | Status: DC | PRN
  Administered 2020-01-26: 4 mg via INTRAVENOUS

## 2020-01-26 MED ORDER — FENTANYL CITRATE (PF) 250 MCG/5ML IJ SOLN
INTRAMUSCULAR | Status: DC | PRN
  Administered 2020-01-26 (×2): 50 ug via INTRAVENOUS

## 2020-01-26 MED ORDER — LACTATED RINGERS IV SOLN: INTRAVENOUS | Status: DC

## 2020-01-26 MED ORDER — CEFAZOLIN SODIUM 1 G IJ SOLR
INTRAMUSCULAR | Status: AC
  Filled 2020-01-26: qty 20

## 2020-01-26 MED ORDER — CEFAZOLIN SODIUM-DEXTROSE 2-4 GM/100ML-% IV SOLN
2.0000 g | Freq: Three times a day (TID) | INTRAVENOUS | Status: AC
  Administered 2020-01-26 (×2): 2 g via INTRAVENOUS
  Filled 2020-01-26 (×2): qty 100

## 2020-01-26 MED ORDER — PHENYLEPHRINE 40 MCG/ML (10ML) SYRINGE FOR IV PUSH (FOR BLOOD PRESSURE SUPPORT)
PREFILLED_SYRINGE | INTRAVENOUS | Status: AC
  Filled 2020-01-26: qty 10

## 2020-01-26 MED ORDER — OXYCODONE HCL 5 MG PO TABS: 5.0000 mg | ORAL_TABLET | Freq: Once | ORAL | Status: DC | PRN

## 2020-01-26 MED ORDER — ONDANSETRON HCL 4 MG/2ML IJ SOLN: 4.0000 mg | Freq: Once | INTRAMUSCULAR | Status: DC | PRN

## 2020-01-26 MED ORDER — FENTANYL CITRATE (PF) 100 MCG/2ML IJ SOLN: 50.0000 ug | Freq: Once | INTRAMUSCULAR | Status: AC

## 2020-01-26 MED ORDER — ONDANSETRON HCL 4 MG/2ML IJ SOLN
INTRAMUSCULAR | Status: DC | PRN
  Administered 2020-01-26: 4 mg via INTRAVENOUS

## 2020-01-26 MED ORDER — OXYCODONE HCL 5 MG/5ML PO SOLN: 5.0000 mg | Freq: Once | ORAL | Status: DC | PRN

## 2020-01-26 MED ORDER — MUPIROCIN 2 % EX OINT
1.0000 "application " | TOPICAL_OINTMENT | Freq: Two times a day (BID) | CUTANEOUS | Status: DC
  Administered 2020-01-26 – 2020-01-28 (×6): 1 via NASAL
  Filled 2020-01-26 (×2): qty 22

## 2020-01-26 MED ORDER — LIDOCAINE 2% (20 MG/ML) 5 ML SYRINGE
INTRAMUSCULAR | Status: DC | PRN
  Administered 2020-01-26: 60 mg via INTRAVENOUS

## 2020-01-26 MED ORDER — 0.9 % SODIUM CHLORIDE (POUR BTL) OPTIME
TOPICAL | Status: DC | PRN
  Administered 2020-01-26: 1000 mL

## 2020-01-26 MED ORDER — FENTANYL CITRATE (PF) 100 MCG/2ML IJ SOLN: 25.0000 ug | INTRAMUSCULAR | Status: DC | PRN

## 2020-01-26 MED ORDER — POTASSIUM CHLORIDE CRYS ER 20 MEQ PO TBCR: 20.0000 meq | EXTENDED_RELEASE_TABLET | Freq: Every day | ORAL | Status: DC | PRN

## 2020-01-26 MED ORDER — SODIUM CHLORIDE 0.9 % IV SOLN: INTRAVENOUS | Status: DC

## 2020-01-26 MED ORDER — MAGNESIUM SULFATE 2 GM/50ML IV SOLN: 2.0000 g | Freq: Every day | INTRAVENOUS | Status: DC | PRN

## 2020-01-26 MED ORDER — MIDAZOLAM HCL 2 MG/2ML IJ SOLN
INTRAMUSCULAR | Status: AC
  Filled 2020-01-26: qty 2

## 2020-01-26 MED ORDER — FENTANYL CITRATE (PF) 250 MCG/5ML IJ SOLN
INTRAMUSCULAR | Status: AC
  Filled 2020-01-26: qty 5

## 2020-01-26 MED ORDER — FENTANYL CITRATE (PF) 100 MCG/2ML IJ SOLN
INTRAMUSCULAR | Status: AC
  Administered 2020-01-26: 50 ug via INTRAVENOUS
  Filled 2020-01-26: qty 2

## 2020-01-26 MED ORDER — EPHEDRINE SULFATE-NACL 50-0.9 MG/10ML-% IV SOSY: PREFILLED_SYRINGE | INTRAVENOUS | Status: DC | PRN

## 2020-01-26 MED ORDER — LIDOCAINE 2% (20 MG/ML) 5 ML SYRINGE
INTRAMUSCULAR | Status: AC
  Filled 2020-01-26: qty 15

## 2020-01-26 MED ORDER — CHLORHEXIDINE GLUCONATE CLOTH 2 % EX PADS
6.0000 | MEDICATED_PAD | Freq: Every day | CUTANEOUS | Status: DC
  Administered 2020-01-26 – 2020-01-28 (×3): 6 via TOPICAL

## 2020-01-26 MED ORDER — PHENYLEPHRINE 40 MCG/ML (10ML) SYRINGE FOR IV PUSH (FOR BLOOD PRESSURE SUPPORT)
PREFILLED_SYRINGE | INTRAVENOUS | Status: DC | PRN
  Administered 2020-01-26 (×2): 120 ug via INTRAVENOUS

## 2020-01-26 MED ORDER — PANTOPRAZOLE SODIUM 40 MG PO TBEC
40.0000 mg | DELAYED_RELEASE_TABLET | Freq: Every day | ORAL | Status: DC
  Administered 2020-01-27 – 2020-01-28 (×2): 40 mg via ORAL
  Filled 2020-01-26 (×2): qty 1

## 2020-01-26 SURGICAL SUPPLY — 53 items
BANDAGE ESMARK 6X9 LF (GAUZE/BANDAGES/DRESSINGS) ×1 IMPLANT
BLADE SAGITTAL (BLADE)
BLADE SAGITTAL 25.0X1.19X90 (BLADE) IMPLANT
BLADE SAGITTAL 25.0X1.19X90MM (BLADE)
BLADE SAW GIGLI 510 (BLADE) ×2 IMPLANT
BLADE SAW GIGLI 510MM (BLADE) ×1
BLADE SAW THK.89X75X18XSGTL (BLADE) IMPLANT
BNDG CMPR 9X6 STRL LF SNTH (GAUZE/BANDAGES/DRESSINGS) ×1
BNDG COHESIVE 6X5 TAN STRL LF (GAUZE/BANDAGES/DRESSINGS) ×3 IMPLANT
BNDG ELASTIC 4X5.8 VLCR STR LF (GAUZE/BANDAGES/DRESSINGS) ×3 IMPLANT
BNDG ELASTIC 6X5.8 VLCR STR LF (GAUZE/BANDAGES/DRESSINGS) ×3 IMPLANT
BNDG ESMARK 6X9 LF (GAUZE/BANDAGES/DRESSINGS) ×3
BNDG GAUZE ELAST 4 BULKY (GAUZE/BANDAGES/DRESSINGS) ×3 IMPLANT
CANISTER SUCT 3000ML PPV (MISCELLANEOUS) ×3 IMPLANT
CLIP VESOCCLUDE MED 6/CT (CLIP) ×3 IMPLANT
COVER SURGICAL LIGHT HANDLE (MISCELLANEOUS) ×3 IMPLANT
COVER WAND RF STERILE (DRAPES) ×3 IMPLANT
CUFF TOURN SGL QUICK 24 (TOURNIQUET CUFF)
CUFF TOURN SGL QUICK 34 (TOURNIQUET CUFF)
CUFF TRNQT CYL 24X4X16.5-23 (TOURNIQUET CUFF) IMPLANT
CUFF TRNQT CYL 34X4.125X (TOURNIQUET CUFF) IMPLANT
DRAIN CHANNEL 19F RND (DRAIN) IMPLANT
DRAPE HALF SHEET 40X57 (DRAPES) ×3 IMPLANT
DRAPE ORTHO SPLIT 77X108 STRL (DRAPES) ×6
DRAPE SURG ORHT 6 SPLT 77X108 (DRAPES) ×2 IMPLANT
DRSG ADAPTIC 3X8 NADH LF (GAUZE/BANDAGES/DRESSINGS) ×3 IMPLANT
ELECT CAUTERY BLADE 6.4 (BLADE) ×3 IMPLANT
ELECT REM PT RETURN 9FT ADLT (ELECTROSURGICAL) ×3
ELECTRODE REM PT RTRN 9FT ADLT (ELECTROSURGICAL) ×1 IMPLANT
EVACUATOR SILICONE 100CC (DRAIN) IMPLANT
GAUZE SPONGE 4X4 12PLY STRL (GAUZE/BANDAGES/DRESSINGS) ×3 IMPLANT
GLOVE BIO SURGEON STRL SZ7.5 (GLOVE) ×5 IMPLANT
GOWN STRL REUS W/ TWL LRG LVL3 (GOWN DISPOSABLE) ×2 IMPLANT
GOWN STRL REUS W/ TWL XL LVL3 (GOWN DISPOSABLE) ×1 IMPLANT
GOWN STRL REUS W/TWL LRG LVL3 (GOWN DISPOSABLE) ×6
GOWN STRL REUS W/TWL XL LVL3 (GOWN DISPOSABLE) ×6
KIT BASIN OR (CUSTOM PROCEDURE TRAY) ×3 IMPLANT
KIT TURNOVER KIT B (KITS) ×3 IMPLANT
NS IRRIG 1000ML POUR BTL (IV SOLUTION) ×3 IMPLANT
PACK GENERAL/GYN (CUSTOM PROCEDURE TRAY) ×3 IMPLANT
PAD ARMBOARD 7.5X6 YLW CONV (MISCELLANEOUS) ×6 IMPLANT
STAPLER VISISTAT 35W (STAPLE) ×3 IMPLANT
STOCKINETTE IMPERVIOUS LG (DRAPES) ×3 IMPLANT
SUT ETHILON 3 0 PS 1 (SUTURE) IMPLANT
SUT SILK 0 TIES 10X30 (SUTURE) ×3 IMPLANT
SUT SILK 2 0 (SUTURE) ×3
SUT SILK 2-0 18XBRD TIE 12 (SUTURE) ×1 IMPLANT
SUT SILK 3 0 (SUTURE)
SUT SILK 3-0 18XBRD TIE 12 (SUTURE) IMPLANT
SUT VIC AB 2-0 CT1 18 (SUTURE) ×6 IMPLANT
TOWEL GREEN STERILE (TOWEL DISPOSABLE) ×6 IMPLANT
UNDERPAD 30X30 (UNDERPADS AND DIAPERS) ×3 IMPLANT
WATER STERILE IRR 1000ML POUR (IV SOLUTION) ×3 IMPLANT

## 2020-01-26 NOTE — Anesthesia Postprocedure Evaluation (Signed)
Anesthesia Post Note  Patient: Terrence Carr  Procedure(s) Performed: AMPUTATION ABOVE KNEE (Right Leg Upper)     Patient location during evaluation: PACU Anesthesia Type: General Level of consciousness: awake and alert Pain management: pain level controlled Vital Signs Assessment: post-procedure vital signs reviewed and stable Respiratory status: spontaneous breathing, nonlabored ventilation and respiratory function stable Cardiovascular status: blood pressure returned to baseline and stable Postop Assessment: no apparent nausea or vomiting Anesthetic complications: no    Last Vitals:  Vitals:   01/26/20 1315 01/26/20 1330  BP: (!) 122/58 125/64  Pulse: 66 70  Resp: 13 14  Temp: 36.4 C 36.7 C  SpO2: 100% 100%    Last Pain:  Vitals:   01/26/20 1330  TempSrc: Oral  PainSc:                  [name]

## 2020-01-26 NOTE — Op Note (Signed)
    Patient name: Terrence Carr MRN: [id number] DOB: 1961/07/18 Sex: male  01/26/2020 Pre-operative Diagnosis: Occluded right lower extremity bypass with gangrenous changes right lower extremity Post-operative diagnosis:  Same Surgeon:  Erlene Quan C. Donzetta Matters, MD Assistant:  Arlee Muslim, PA Procedure Performed:  Right above-knee amputation  Indications: 59 year old male with history of left above-knee amputation.  He underwent a femoral to below-knee popliteal artery bypass with vein and hopes of salvage of his gangrenous changes of his right foot.  Bypass is now occluded he has gangrenous changes of the left leg and is indicated for above-knee amputation.  Findings: There is adequate bleeding in the wound bed to suggest healing possibility.  Anterior posterior flaps were approximated without tension.   Procedure:  The patient was identified in the holding area and taken to the operating was put supine operative general anesthesia induced.  He was sterilely prepped and draped in the right lower extremity usual fashion antibiotics were administered and a timeout was called.  A fishmouth type incision was created above the knee.  We used cautery to dissect down to the level of the bone.  Periosteum was elevated.  Bone was transected with Gigli knife.  Posterior flap was created.  Vessels were suture-ligated.  Bone was smoothed with rasp.  The wound was thoroughly irrigated and hemostasis obtained.  Anterior posterior flaps were reapproximated with interrupted 2-0 Vicryl suture.  Staples were used to the level of skin.  A sterile dressing was placed.  He was awake from anesthesia having tolerated procedure without immediate complication.  All counts were correct at completion.  EBL: 200 cc    Azaylia Fong C. Donzetta Matters, MD [facility] Office: [phone] Pager: [phone]

## 2020-01-26 NOTE — Anesthesia Procedure Notes (Signed)
Procedure Name: LMA Insertion Date/Time: 01/26/2020 12:00 PM Performed by: Bryson Corona, CRNA Pre-anesthesia Checklist: Patient identified, Emergency Drugs available, Suction available and Patient being monitored Patient Re-evaluated:Patient Re-evaluated prior to induction Oxygen Delivery Method: Circle System Utilized Preoxygenation: Pre-oxygenation with 100% oxygen Induction Type: IV induction Ventilation: Mask ventilation without difficulty LMA: LMA inserted LMA Size: 4.0 Number of attempts: 1 Placement Confirmation: positive ETCO2 Tube secured with: Tape Dental Injury: Teeth and Oropharynx as per pre-operative assessment

## 2020-01-26 NOTE — Progress Notes (Signed)
[facility] for right AKA today.   Objective (!) 140/56 84 97.6 F (36.4 C) (Oral) (!) 21 100%  Intake/Output Summary (Last 24 hours) at 01/26/2020 0733 Last data filed at 01/26/2020 0516 Gross per 24 hour  Intake 1025 ml  Output 450 ml  Net 575 ml        Laboratory Lab Results: Recent Labs    01/25/20 2115  WBC 12.4*  HGB 5.6*  HCT 18.3*  PLT 1,170*   BMET Recent Labs    01/25/20 2115  NA 135  K 4.0  CL 102  CO2 22  GLUCOSE 141*  BUN 6  CREATININE 0.92  CALCIUM 8.9    COAG Lab Results  Component Value Date   INR 1.1 01/25/2020   INR 0.9 12/30/2019   INR 1.0 12/28/2019   No results found for: PTT  Assessment/Planning:  Critical limb ischemia with tissue loss of the right lower extremity.  Right common femoral to below-knee popliteal bypass is occluded.  Has had fairly advanced progression of his tissue loss.  Discussed the limb is not salvageable at this point.  He did get 2 units packed red blood cells overnight for hemoglobin of 5.6 chronically anemic.  No obvious source for blood loss per patient.  Plan for right AKA today with Dr. Donzetta Matters.  Risks and benefits discussed.  Terrence Carr 01/26/2020 7:33 AM --

## 2020-01-26 NOTE — Progress Notes (Signed)
   History and Physical Update  The patient was interviewed and re-examined.  The patient's previous History and Physical has been reviewed and is unchanged from Dr. Ainsley Spinner evaluation.  Plan for right above-knee amputation today.  I discussed the risk and benefits with the patient he demonstrates good understanding.  He has been transfused 2 units.  Ashe Gago C. Donzetta Matters, MD [facility] Office: [phone] Pager: [phone]  01/26/2020, 11:19 AM

## 2020-01-26 NOTE — Transfer of Care (Signed)
Immediate Anesthesia Transfer of Care Note  Patient: Terrence Carr  Procedure(s) Performed: AMPUTATION ABOVE KNEE (Right Leg Upper)  Patient Location: PACU  Anesthesia Type:General  Level of Consciousness: drowsy and patient cooperative  Airway & Oxygen Therapy: Patient Spontanous Breathing and Patient connected to face mask oxygen  Post-op Assessment: Report given to RN and Post -op Vital signs reviewed and stable  Post vital signs: Reviewed and stable  Last Vitals:  Vitals Value Taken Time  BP 118/56 01/26/20 1244  Temp    Pulse 64 01/26/20 1245  Resp 16 01/26/20 1245  SpO2 100 % 01/26/20 1245  Vitals shown include unvalidated device data.  Last Pain:  Vitals:   01/26/20 0800  TempSrc:   PainSc: 8       Patients Stated Pain Goal: 3 ([id number])  Complications: No apparent anesthesia complications

## 2020-01-26 NOTE — Anesthesia Preprocedure Evaluation (Addendum)
Anesthesia Evaluation  Patient identified by MRN, date of birth, ID band Patient awake    Reviewed: Allergy & Precautions, NPO status , Patient's Chart, lab work & pertinent test results  History of Anesthesia Complications Negative for: history of anesthetic complications  Airway Mallampati: II  TM Distance: >3 FB Neck ROM: Full    Dental  (+) Dental Advisory Given, Poor Dentition, Loose, Missing   Pulmonary neg pulmonary ROS,    Pulmonary exam normal        Cardiovascular hypertension, + Peripheral Vascular Disease  Normal cardiovascular exam     Neuro/Psych negative neurological ROS  negative psych ROS   GI/Hepatic negative GI ROS, (+)     substance abuse  alcohol use,   Endo/Other  negative endocrine ROS  Renal/GU negative Renal ROS     Musculoskeletal negative musculoskeletal ROS (+)   Abdominal   Peds  Hematology  (+) anemia ,  Thrombocytosis (Plt >1100k)    Anesthesia Other Findings Covid neg 1/25 Gangrene   Reproductive/Obstetrics                            Anesthesia Physical Anesthesia Plan  ASA: IV  Anesthesia Plan: General   Post-op Pain Management:    Induction: Intravenous  PONV Risk Score and Plan: 2 and Treatment may vary due to age or medical condition, Ondansetron, Dexamethasone and Midazolam  Airway Management Planned: LMA  Additional Equipment: None  Intra-op Plan:   Post-operative Plan: Extubation in OR  Informed Consent: I have reviewed the patients History and Physical, chart, labs and discussed the procedure including the risks, benefits and alternatives for the proposed anesthesia with the patient or authorized representative who has indicated his/her understanding and acceptance.     Dental advisory given  Plan Discussed with: CRNA and Anesthesiologist  Anesthesia Plan Comments:        Anesthesia Quick Evaluation

## 2020-01-27 ENCOUNTER — Inpatient Hospital Stay (HOSPITAL_COMMUNITY): Payer: Medicaid Other

## 2020-01-27 DIAGNOSIS — R7881 Bacteremia: Secondary | ICD-10-CM

## 2020-01-27 DIAGNOSIS — I10 Essential (primary) hypertension: Secondary | ICD-10-CM

## 2020-01-27 DIAGNOSIS — Z720 Tobacco use: Secondary | ICD-10-CM

## 2020-01-27 DIAGNOSIS — I739 Peripheral vascular disease, unspecified: Secondary | ICD-10-CM

## 2020-01-27 DIAGNOSIS — Z888 Allergy status to other drugs, medicaments and biological substances status: Secondary | ICD-10-CM

## 2020-01-27 DIAGNOSIS — B9561 Methicillin susceptible Staphylococcus aureus infection as the cause of diseases classified elsewhere: Secondary | ICD-10-CM

## 2020-01-27 DIAGNOSIS — Z89612 Acquired absence of left leg above knee: Secondary | ICD-10-CM

## 2020-01-27 DIAGNOSIS — Z89611 Acquired absence of right leg above knee: Secondary | ICD-10-CM

## 2020-01-27 DIAGNOSIS — Z886 Allergy status to analgesic agent status: Secondary | ICD-10-CM

## 2020-01-27 LAB — BASIC METABOLIC PANEL
Anion gap: 10 (ref 5–15)
BUN: 9 mg/dL (ref 6–20)
CO2: 23 mmol/L (ref 22–32)
Calcium: 8.3 mg/dL — ABNORMAL LOW (ref 8.9–10.3)
Chloride: 103 mmol/L (ref 98–111)
Creatinine, Ser: 0.79 mg/dL (ref 0.61–1.24)
GFR calc Af Amer: >60 mL/min (ref >60)
GFR calc non Af Amer: >60 mL/min (ref >60)
Glucose, Bld: 141 mg/dL — ABNORMAL HIGH (ref 70–99)
Potassium: 4.2 mmol/L (ref 3.5–5.1)
Sodium: 136 mmol/L (ref 135–145)

## 2020-01-27 LAB — BPAM RBC
Blood Product Expiration Date: 202102242359
Blood Product Expiration Date: 202102242359
ISSUE DATE / TIME: 202101252353
ISSUE DATE / TIME: 202101260243
Unit Type and Rh: 5100
Unit Type and Rh: 5100

## 2020-01-27 LAB — CBC
HCT: 25 % — ABNORMAL LOW (ref 39.0–52.0)
Hemoglobin: 7.9 g/dL — ABNORMAL LOW (ref 13.0–17.0)
MCH: 29.4 pg (ref 26.0–34.0)
MCHC: 31.6 g/dL (ref 30.0–36.0)
MCV: 92.9 fL (ref 80.0–100.0)
Platelets: 863 10*3/uL — ABNORMAL HIGH (ref 150–400)
RBC: 2.69 MIL/uL — ABNORMAL LOW (ref 4.22–5.81)
RDW: 15.9 % — ABNORMAL HIGH (ref 11.5–15.5)
WBC: 12.1 10*3/uL — ABNORMAL HIGH (ref 4.0–10.5)
nRBC: 0 % (ref 0.0–0.2)

## 2020-01-27 LAB — TYPE AND SCREEN
ABO/RH(D): O POS
Antibody Screen: NEGATIVE
Unit division: 0
Unit division: 0

## 2020-01-27 LAB — ECHOCARDIOGRAM COMPLETE
Height: 66 in
Weight: 1904.77 oz

## 2020-01-27 MED ORDER — VANCOMYCIN HCL IN DEXTROSE 1-5 GM/200ML-% IV SOLN
1000.0000 mg | Freq: Once | INTRAVENOUS | Status: AC
  Administered 2020-01-27: 1000 mg via INTRAVENOUS
  Filled 2020-01-27: qty 200

## 2020-01-27 MED ORDER — VANCOMYCIN HCL 500 MG/100ML IV SOLN
500.0000 mg | Freq: Two times a day (BID) | INTRAVENOUS | Status: DC
  Administered 2020-01-27 – 2020-01-28 (×2): 500 mg via INTRAVENOUS
  Filled 2020-01-27 (×3): qty 100

## 2020-01-27 NOTE — Progress Notes (Signed)
  Echocardiogram 2D Echocardiogram has been performed.  Terrence Carr 01/27/2020, 5:39 PM

## 2020-01-27 NOTE — Consult Note (Signed)
[facility]    Date of Admission:  01/25/2020     Total days of antibiotics 3  Day 1 vancomycin                Reason for Consult: Staphylococcus Aureus Bacteremia     Referring Provider: CHAMP  Primary Care Provider: Patient, No Pcp Per     Assessment: Terrence Carr is a 59 y.o. 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 Blood cultures drawn at admission reveal 2/4 bottles growing staphylococcus aureus. Source is likely from his previous leg that has now been amputated. He has no signs of metastatic infection at this time. No hardware or cardiac device in place to complicate. Will check clearance blood cultures today and check transthoracic echocardiogram.   Continue vancomycin for now until sensitivities return.     Plan: 1. Continue vancomycin per pharmacy  2. Repeat blood cultures now 3. Check transthoracic echocardiogram      Principal Problem:   MSSA bacteremia Active Problems:   Gangrene ([place])   PAD (peripheral artery disease) ([place])   . aspirin EC  81 mg Oral Daily  . atorvastatin  20 mg Oral q1800  . buPROPion  100 mg Oral BID  . Chlorhexidine Gluconate Cloth  6 each Topical Q0600  . docusate sodium  100 mg Oral Daily  . folic acid  1 mg Oral Daily  . gabapentin  100 mg Oral TID  . heparin  5,000 Units Subcutaneous Q8H  . mupirocin ointment  1 application Nasal BID  . pantoprazole  40 mg Oral Daily  . cyanocobalamin  1,000 mcg Oral Daily    HPI: Terrence Carr is a 59 y.o. male admitted with ischemic/gangrenous wounds up the right lower foot and leg. 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   He was transferred from [facility] with concern for critical limb ischemia involving the RLE. He previously underwent RLE arteriogram for an attempt to salvage the right limb and had common femoral endarterectomy with right common femoral to below-knee  popliteal bypass with saphenous vein 12/30/19. He still smokes.   He did not notice any subjective or measured fevers prior to admission. Opened gangrenous/ulcerated lesion to central shin on the right noted on admission pictures. Reports that the underside of his leg is still very itchy and occasionally painful. Feels that his leg is still there sometimes.   Blood cultures were drawn at admission with leukocytosis 12.4K and revealed 2/4 bottles have grown out staphylococcus aureus with sensitivities pending. He has been on cefazolin but changed to vancomycin today after notified of SAB.     Review of Systems: Review of Systems  Constitutional: Negative for chills and fever.  Respiratory: Negative for cough and shortness of breath.   Cardiovascular: Negative for chest pain.  Gastrointestinal: Negative for abdominal pain, diarrhea and vomiting.  Genitourinary: Negative for dysuria and frequency.  Skin: Negative for rash.    Past Medical History:  Diagnosis Date  . Hypertension     Social History   Tobacco Use  . Smoking status: Never Smoker  . Smokeless tobacco: Never Used  Substance Use Topics  . Alcohol use: Never  . Drug use: Never    History reviewed. No pertinent family history. Allergies  Allergen Reactions  . Ibuprofen Other (See Comments)    Patient said "the little brown pills" that he  bought from the store made him talk out of his head (reported by [facility])  . Other Other (See Comments)    Unknown reaction to "Dihydroxyalaminum aminoacetate magnesium carbonate" (reported by [facility]    OBJECTIVE: Blood pressure (!) 147/66, pulse 77, temperature 98.2 F (36.8 C), temperature source Oral, resp. rate 13, height 5\' 6"  (1.676 m), weight 54 kg, SpO2 100 %.  Physical Exam Constitutional:      Comments: Sitting up in bed working with PT. Alert and able to answer most questions. Hard to understand at times.   Eyes:     General: No scleral  icterus.    Pupils: Pupils are equal, round, and reactive to light.  Cardiovascular:     Rate and Rhythm: Normal rate and regular rhythm.     Heart sounds: No murmur.  Pulmonary:     Effort: Pulmonary effort is normal.     Breath sounds: Normal breath sounds.  Abdominal:     General: There is no distension.     Tenderness: There is no abdominal tenderness.  Musculoskeletal:     Comments: Left AKA with well healed stump incision Right AKA wrapped with ACE wrap - clean and dry.   Skin:    General: Skin is warm and dry.  Neurological:     Mental Status: He is oriented to person, place, and time.     Lab Results Lab Results  Component Value Date   WBC 12.1 (H) 01/27/2020   HGB 7.9 (L) 01/27/2020   HCT 25.0 (L) 01/27/2020   MCV 92.9 01/27/2020   PLT 863 (H) 01/27/2020    Lab Results  Component Value Date   CREATININE 0.79 01/27/2020   BUN 9 01/27/2020   NA 136 01/27/2020   K 4.2 01/27/2020   CL 103 01/27/2020   CO2 23 01/27/2020    Lab Results  Component Value Date   ALT 12 01/25/2020   AST 13 (L) 01/25/2020   ALKPHOS 124 01/25/2020   BILITOT <0.1 (L) 01/25/2020     Microbiology: Recent Results (from the past 240 hour(s))  Respiratory Panel by RT PCR (Flu A&B, Covid) - Nasopharyngeal Swab     Status: None   Collection Time: 01/25/20  7:57 PM   Specimen: Nasopharyngeal Swab  Result Value Ref Range Status   SARS Coronavirus 2 by RT PCR NEGATIVE NEGATIVE Final    Comment: (NOTE) SARS-CoV-2 target nucleic acids are NOT DETECTED. The SARS-CoV-2 RNA is generally detectable in upper respiratoy specimens during the acute phase of infection. The lowest concentration of SARS-CoV-2 viral copies this assay can detect is 131 copies/mL. A negative result does not preclude SARS-Cov-2 infection and should not be used as the sole basis for treatment or other patient management decisions. A negative result may occur with  improper specimen collection/handling, submission of  specimen other than nasopharyngeal swab, presence of viral mutation(s) within the areas targeted by this assay, and inadequate number of viral copies (<131 copies/mL). A negative result must be combined with clinical observations, patient history, and epidemiological information. The expected result is Negative. Fact Sheet for Patients:  PinkCheek.be Fact Sheet for Healthcare Providers:  GravelBags.it This test is not yet ap proved or cleared by the [place] FDA and  has been authorized for detection and/or diagnosis of SARS-CoV-2 by FDA under an Emergency Use Authorization (EUA). This EUA will remain  in effect (meaning this test can be used) for the duration of the COVID-19 declaration under Section 564(b)(1) of  the Act, 21 U.S.C. section 360bbb-3(b)(1), unless the authorization is terminated or revoked sooner.    Influenza A by PCR NEGATIVE NEGATIVE Final   Influenza B by PCR NEGATIVE NEGATIVE Final    Comment: (NOTE) The Xpert Xpress SARS-CoV-2/FLU/RSV assay is intended as an aid in  the diagnosis of influenza from Nasopharyngeal swab specimens and  should not be used as a sole basis for treatment. Nasal washings and  aspirates are unacceptable for Xpert Xpress SARS-CoV-2/FLU/RSV  testing. Fact Sheet for Patients: PinkCheek.be Fact Sheet for Healthcare Providers: GravelBags.it This test is not yet approved or cleared by the [place] FDA and  has been authorized for detection and/or diagnosis of SARS-CoV-2 by  FDA under an Emergency Use Authorization (EUA). This EUA will remain  in effect (meaning this test can be used) for the duration of the  Covid-19 declaration under Section 564(b)(1) of the Act, 21  U.S.C. section 360bbb-3(b)(1), unless the authorization is  terminated or revoked. Performed at [facility], [place] [address], [place],  [place] [address]   Culture, blood (routine x 2)     Status: Abnormal (Preliminary result)   Collection Time: 01/25/20  9:20 PM   Specimen: BLOOD  Result Value Ref Range Status   Specimen Description BLOOD RIGHT ANTECUBITAL  Final   Special Requests   Final    BOTTLES DRAWN AEROBIC AND ANAEROBIC Blood Culture adequate volume   Culture  Setup Time   Final    GRAM POSITIVE COCCI IN CLUSTERS IN BOTH AEROBIC AND ANAEROBIC BOTTLES CRITICAL RESULT CALLED TO, READ BACK BY AND VERIFIED WITH: KRIS HAYES @1148  01/26/20 AKT    Culture (A)  Final    STAPHYLOCOCCUS AUREUS SUSCEPTIBILITIES TO FOLLOW Performed at [facility], [place] [address] [place], [place] [address]    Report Status PENDING  Incomplete  Culture, blood (routine x 2)     Status: None (Preliminary result)   Collection Time: 01/25/20  9:22 PM   Specimen: BLOOD LEFT FOREARM  Result Value Ref Range Status   Specimen Description BLOOD LEFT FOREARM  Final   Special Requests   Final    BOTTLES DRAWN AEROBIC AND ANAEROBIC Blood Culture adequate volume   Culture   Final    NO GROWTH 2 DAYS Performed at [facility], [place] [address], [place], [place] [address]    Report Status PENDING  Incomplete  Surgical pcr screen     Status: Abnormal   Collection Time: 01/25/20 11:37 PM   Specimen: Nasal Mucosa; Nasal Swab  Result Value Ref Range Status   MRSA, PCR POSITIVE (A) NEGATIVE Final    Comment: RESULT CALLED TO, READ BACK BY AND VERIFIED WITH: T IRBY RN 01/26/20 0108 JDW    Staphylococcus aureus POSITIVE (A) NEGATIVE Final    Comment: (NOTE) The Xpert SA Assay (FDA approved for NASAL specimens in patients 56 years of age and older), is one component of a comprehensive surveillance program. It is not intended to diagnose infection nor to guide or monitor treatment. Performed at [facility], [place] [address] [place], [place] [address]     Janene Madeira, MSN, NP-C [facility] [place] Cell: [phone] Pager: [phone]  01/27/2020 12:23 PM

## 2020-01-27 NOTE — Evaluation (Signed)
Physical Therapy Evaluation Patient Details Name: Terrence Carr MRN: [id number] DOB: Aug 06, 1961 Today's Date: 01/27/2020   History of Present Illness  Pt is 59 yo male with PMH of L AKA November 2020 (no prosthesis at this time), HTN, tobacco abuse, and R fem-pop bypass.  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  Clinical Impression  Pt admitted with above diagnosis. Pt motivated to work with therapy and was able to perform anterior posterior scoot to chair with min A. Needed cues for technique and will need PT to improve mobility, ROM, and strength.  Pt reporting good family support at home and has w/c with ramp.  Pt was previously not ambulating due to recent L AKA but could stand pivot.  Pt currently with functional limitations due to the deficits listed below (see PT Problem List). Pt will benefit from skilled PT to increase their independence and safety with mobility to allow discharge to the venue listed below.       Follow Up Recommendations CIR;Supervision/Assistance - 24 hour    Equipment Recommendations  Other (comment)(defer to next venue)    Recommendations for Other Services Rehab consult     Precautions / Restrictions Precautions Precautions: Fall Precaution Comments: Bil AKA Restrictions Weight Bearing Restrictions: Yes RLE Weight Bearing: Non weight bearing LLE Weight Bearing: Non weight bearing      Mobility  Bed Mobility Overal bed mobility: Needs Assistance Bed Mobility: Supine to Sit     Supine to sit: Min assist        Transfers Overall transfer level: Needs assistance Equipment used: None Transfers: Comptroller transfers: Min assist;+2 safety/equipment   General transfer comment: Pt able to scoot from bed to recliner with increased time and cues  Ambulation/Gait             General Gait Details: deferred- no prosthesis for L AKA and new R AKA  Stairs             Wheelchair Mobility    Modified Rankin (Stroke Patients Only)       Balance Overall balance assessment: Needs assistance Sitting-balance support: Bilateral upper extremity supported;Feet unsupported Sitting balance-Leahy Scale: Fair         Standing balance comment: deferred                             Pertinent Vitals/Pain Pain Assessment: Faces Faces Pain Scale: Hurts a little bit Pain Location: R leg Pain Descriptors / Indicators: Sore Pain Intervention(s): Limited activity within patient's tolerance;Repositioned    Home Living Family/patient expects to be discharged to:: Private residence Living Arrangements: Spouse/significant other;Children;Other relatives Available Help at Discharge: Family;Available 24 hours/day Type of Home: House Home Access: Ramped entrance     Home Layout: One level Home Equipment: Wheelchair - manual;Bedside commode;Shower seat Additional Comments: Pt reports home is w/c accessible    Prior Function Level of Independence: Needs assistance   Gait / Transfers Assistance Needed: Pt using a wheelchair but was able to stand on R leg and pivot           Hand Dominance        Extremity/Trunk Assessment   Upper Extremity Assessment Upper Extremity Assessment: Defer to OT evaluation    Lower Extremity Assessment Lower Extremity Assessment: LLE deficits/detail;RLE deficits/detail RLE Deficits / Details: ROM limited by pain , tends to keep hip flexed/elevated (  encouraged resting straight as able) LLE Deficits / Details: Demontrating ROM WFL of hip       Communication   Communication: No difficulties  Cognition Arousal/Alertness: Awake/alert Behavior During Therapy: WFL for tasks assessed/performed Overall Cognitive Status: Within Functional Limits for tasks assessed                                        General Comments      Exercises     Assessment/Plan    PT Assessment Patient  needs continued PT services  PT Problem List Decreased strength;Decreased mobility;Decreased range of motion;Decreased knowledge of precautions;Decreased activity tolerance;Decreased balance;Decreased knowledge of use of DME       PT Treatment Interventions DME instruction;Therapeutic activities;Therapeutic exercise;Patient/family education;Balance training;Functional mobility training    PT Goals (Current goals can be found in the Care Plan section)  Acute Rehab PT Goals Patient Stated Goal: not stated PT Goal Formulation: With patient Time For Goal Achievement: 02/11/20 Potential to Achieve Goals: Good    Frequency Min 3X/week   Barriers to discharge        Co-evaluation PT/OT/SLP Co-Evaluation/Treatment: Yes Reason for Co-Treatment: Complexity of the patient's impairments (multi-system involvement);For patient/therapist safety PT goals addressed during session: Mobility/safety with mobility;Balance OT goals addressed during session: ADL's and self-care;Strengthening/ROM       AM-PAC PT "6 Clicks" Mobility  Outcome Measure Help needed turning from your back to your side while in a flat bed without using bedrails?: A Little Help needed moving from lying on your back to sitting on the side of a flat bed without using bedrails?: A Little Help needed moving to and from a bed to a chair (including a wheelchair)?: A Little Help needed standing up from a chair using your arms (e.g., wheelchair or bedside chair)?: Total(Bil AKA not appropriate for standing, walking, stairs) Help needed to walk in hospital room?: Total Help needed climbing 3-5 steps with a railing? : Total 6 Click Score: 12    End of Session   Activity Tolerance: Patient tolerated treatment well Patient left: in chair;Other (comment);with call bell/phone within reach(chair alarm pad in place but no box in room -notified RN; pt is following commands and verbalized will call for assist) Nurse Communication: Mobility  status PT Visit Diagnosis: Muscle weakness (generalized) (M62.81)    Time: [id number] PT Time Calculation (min) (ACUTE ONLY): 30 min   Charges:   PT Evaluation $PT Eval Low Complexity: 1 Low          Maggie Font, PT Acute Rehab Services Pager [phone] [facility] [phone] [facility] [phone]   Karlton Lemon 01/27/2020, 11:27 AM

## 2020-01-27 NOTE — Progress Notes (Signed)
Pharmacy Antibiotic Note  Terrence Carr is a 59 y.o. male admitted on 01/25/2020 with right foot/leg gangrene. S/P AKA of right leg yesterday. Now with staph aureus in 1/2 blood cultures.  Pharmacy has been consulted for vancomycin dosing. WBC 12.1 and patient afebrile. SCr-0.79. Noted that patient has lower body mass due to bilateral AKAs.   Plan: Vancomycin 1000 mg X 1  Then 500 mg every 12 hours  Predicted AUC 474 with SCr 1  Monitor renal function, blood culture susceptibilities, levels as appropriate  Height: 5\' 6"  (167.6 cm) Weight: 119 lb 0.8 oz (54 kg)(Bed zeroed for new patient ) IBW/kg (Calculated) : 63.8  Temp (24hrs), Avg:98 F (36.7 C), Min:97.6 F (36.4 C), Max:98.3 F (36.8 C)  Recent Labs  Lab 01/25/20 2115 01/26/20 1018 01/27/20 0252  WBC 12.4*  --  12.1*  CREATININE 0.92 0.60* 0.79  LATICACIDVEN 2.1*  --   --     Estimated Creatinine Clearance: 75 mL/min (by C-G formula based on SCr of 0.79 mg/dL).    Allergies  Allergen Reactions  . Ibuprofen Other (See Comments)    Patient said "the little brown pills" that he bought from the store made him talk out of his head (reported by [facility])  . Other Other (See Comments)    Unknown reaction to "Dihydroxyalaminum aminoacetate magnesium carbonate" (reported by [facility]      Thank you for allowing pharmacy to be a part of this patient's care.  Jimmy Footman, PharmD, BCPS, BCIDP Infectious Diseases Clinical Pharmacist Phone: [phone] 01/27/2020 9:06 AM

## 2020-01-27 NOTE — Progress Notes (Addendum)
  Progress Note    01/27/2020 7:25 AM 1 Day Post-Op  Subjective:  Says his pain is much better  Afebrile HR 60's-80's NSR [id number] systolic 0000000 RA  Vitals:   01/27/20 0100 01/27/20 0441  BP: 135/70 (!) 144/69  Pulse: 74 79  Resp: 11 (!) 21  Temp: 98.3 F (36.8 C) 98.3 F (36.8 C)  SpO2: 100% 100%    Physical Exam: Incisions:  Bandage is in tact and clean and dry   CBC    Component Value Date/Time   WBC 12.1 (H) 01/27/2020 0252   RBC 2.69 (L) 01/27/2020 0252   HGB 7.9 (L) 01/27/2020 0252   HCT 25.0 (L) 01/27/2020 0252   HCT 24.7 (L) 12/24/2019 0419   PLT 863 (H) 01/27/2020 0252   MCV 92.9 01/27/2020 0252   MCH 29.4 01/27/2020 0252   MCHC 31.6 01/27/2020 0252   RDW 15.9 (H) 01/27/2020 0252   LYMPHSABS 1.4 01/25/2020 2115   MONOABS 1.2 (H) 01/25/2020 2115   EOSABS 0.2 01/25/2020 2115   BASOSABS 0.1 01/25/2020 2115    BMET    Component Value Date/Time   NA 136 01/27/2020 0252   K 4.2 01/27/2020 0252   CL 103 01/27/2020 0252   CO2 23 01/27/2020 0252   GLUCOSE 141 (H) 01/27/2020 0252   BUN 9 01/27/2020 0252   CREATININE 0.79 01/27/2020 0252   CALCIUM 8.3 (L) 01/27/2020 0252   GFRNONAA >60 01/27/2020 0252   GFRAA >60 01/27/2020 0252    INR    Component Value Date/Time   INR 1.1 01/25/2020 2115     Intake/Output Summary (Last 24 hours) at 01/27/2020 0725 Last data filed at 01/27/2020 0442 Gross per 24 hour  Intake 1319.27 ml  Output 1350 ml  Net -30.73 ml     Assessment/Plan:  59 y.o. male is s/p right above knee amputation  1 Day Post-Op  -pre operative pain is much improved -bandage is in tact and dry-will take down dressing tomorrow    Leontine Locket, PA-C [facility] [phone] 01/27/2020 7:25 AM    I have seen and evaluated the patient. I agree with the PA note as documented above. 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  Pain well controlled.  Will take down dressing tomorrow.  Marty Heck,  MD [facility] [place] Office: [phone]

## 2020-01-27 NOTE — Evaluation (Signed)
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   Clinical Impression   Pt admitted with gangrene RLE.   Pt currently with functional limitations due to the deficits listed below (see OT Problem List).  Pt will benefit from skilled OT to increase their safety and independence with ADL and functional mobility for ADL to facilitate discharge to venue listed below.     Follow Up Recommendations  [facility]    Equipment Recommendations  None recommended by OT    Recommendations for Other Services       Precautions / Restrictions Precautions Precautions: Fall Precaution Comments: Bil AKA Restrictions RLE Weight Bearing: Non weight bearing LLE Weight Bearing: Non weight bearing      Mobility Bed Mobility Overal bed mobility: Needs Assistance Bed Mobility: Supine to Sit     Supine to sit: Min assist        Transfers Overall transfer level: Needs assistance Equipment used: None Transfers: Comptroller transfers: Min assist;+2 safety/equipment   General transfer comment: Pt able to scoot from bed to recliner with increased time and cues    Balance Overall balance assessment: Needs assistance Sitting-balance support: Bilateral upper extremity supported;Feet unsupported Sitting balance-Leahy Scale: Fair         Standing balance comment: deferred                           ADL either performed or assessed with clinical judgement   ADL Overall ADL's : Needs assistance/impaired Eating/Feeding: Set up;Sitting   Grooming: Set up;Sitting   Upper Body Bathing: Minimal assistance;Sitting   Lower Body Bathing: Maximal  assistance;Sitting/lateral leans   Upper Body Dressing : Minimal assistance;Sitting   Lower Body Dressing: Maximal assistance;Sitting/lateral leans;Bed level                 General ADL Comments: Pt will benefit from post acute rehab     Vision Patient Visual Report: No change from baseline       Perception     Praxis      Pertinent Vitals/Pain Pain Assessment: No/denies pain Faces Pain Scale: Hurts a little bit Pain Location: R leg Pain Descriptors / Indicators: Sore Pain Intervention(s): Limited activity within patient's tolerance;Repositioned     Hand Dominance     Extremity/Trunk Assessment Upper Extremity Assessment Upper Extremity Assessment: Generalized weakness   Lower Extremity Assessment Lower Extremity Assessment: LLE deficits/detail;RLE deficits/detail RLE Deficits / Details: ROM limited by pain , tends to keep hip flexed/elevated (encouraged resting straight as able) LLE Deficits / Details: Demontrating ROM WFL of hip       Communication Communication Communication: No difficulties   Cognition Arousal/Alertness: Awake/alert Behavior During Therapy: WFL for tasks assessed/performed Overall Cognitive Status: Within Functional Limits for tasks assessed                                                Home Living Family/patient expects to be discharged to:: Private residence Living Arrangements: Spouse/significant other;Children;Other  relatives Available Help at Discharge: Family;Available 24 hours/day Type of Home: House Home Access: Ramped entrance     Home Layout: One level     Bathroom Shower/Tub: Occupational psychologist: Standard     Home Equipment: Wheelchair - manual;Bedside commode;Shower seat   Additional Comments: Pt reports home is w/c accessible      Prior Functioning/Environment Level of Independence: Needs assistance  Gait / Transfers Assistance Needed: Pt using a wheelchair but was able to  stand on R leg and pivot              OT Problem List: Decreased strength;Decreased activity tolerance;Impaired balance (sitting and/or standing);Decreased knowledge of use of DME or AE;Decreased knowledge of precautions      OT Treatment/Interventions: Self-care/ADL training;Patient/family education;Therapeutic exercise;DME and/or AE instruction    OT Goals(Current goals can be found in the care plan section) Acute Rehab OT Goals Patient Stated Goal: get back home OT Goal Formulation: With patient Time For Goal Achievement: 02/02/20  OT Frequency: Min 2X/week   Barriers to D/C:            Co-evaluation   Reason for Co-Treatment: Complexity of the patient's impairments (multi-system involvement);For patient/therapist safety PT goals addressed during session: Mobility/safety with mobility;Balance OT goals addressed during session: ADL's and self-care;Strengthening/ROM      AM-PAC OT "6 Clicks" Daily Activity     Outcome Measure Help from another person eating meals?: A Little Help from another person taking care of personal grooming?: A Little Help from another person toileting, which includes using toliet, bedpan, or urinal?: Total Help from another person bathing (including washing, rinsing, drying)?: A Lot Help from another person to put on and taking off regular upper body clothing?: A Lot Help from another person to put on and taking off regular lower body clothing?: Total 6 Click Score: 12   End of Session Nurse Communication: Mobility status  Activity Tolerance: Patient tolerated treatment well Patient left: in chair  OT Visit Diagnosis: Other abnormalities of gait and mobility (R26.89);Muscle weakness (generalized) (M62.81);History of falling (Z91.81)                Time: [id number] OT Time Calculation (min): 24 min Charges:  OT General Charges $OT Visit: 1 Visit OT Evaluation $OT Eval Moderate Complexity: 1 Mod  Kari Baars, [place] [phone] Office- [phone]     Cassity Christian, Edwena Felty D 01/27/2020, 1:54 PM

## 2020-01-27 NOTE — Progress Notes (Signed)
Inpatient Rehabilitation Admissions Coordinator  Inpatient rehab consult received. I met with patient at bedside for rehab assessment., Pt previously at [facility] after first amputation. He is in agreement to another admission. I contacted his granddaughter , Graylon Good by phone and she also prefers CIR before return home. I will follow up tomorrow for possible admit pending bed availability.  Danne Baxter, RN, MSN Rehab Admissions Coordinator [phone] 01/27/2020 12:31 PM

## 2020-01-27 NOTE — H&P (Signed)
Physical Medicine and Rehabilitation Admission H&P    Chief Complaint  Patient presents with  . [facility] [place]/clotted fem/pop  : HPI: Terrence Carr is a 59 year old right-handed male with history of hypertension, tobacco and alcohol use as well as vitamin B12 deficiency with peripheral vascular disease revascularization of right iliac artery stenting placement by Dr. 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 Donnetta Hutching as well as right common femoral to below-knee popliteal artery bypass 12/30/2019 receiving inpatient rehab services 12/30/2020 until  01/08/2020 and discharged to home maintained on aspirin.  Per chart review patient lives with spouse and family.  1 level home with ramped entrance.  Patient was using a wheelchair but was able to stand on the right leg and pivot.  Presented 01/25/2020 after being seen by vascular surgery and follow-up with noted extensive gangrenous changes of the right foot.Admission labs hemoglobin 5.6 and transfused 2 units PRBC.  Limb was not felt to be salvageable patient initially somewhat resistant to surgical intervention and underwent right AKA 01/26/2020 per Dr. Donzetta Matters.  Noted blood cultures did show 1/2 staph aureus.  Patient initially placed on vancomycin with follow-up per infectious disease.  Transthoracic echocardiogram with ejection fraction of 65%.  No left ventricular hypertrophy.  No regional wall motion abnormalities.  As well as follow-up repeat blood cultures pending.  Subcutaneous heparin for DVT prophylaxis.  Acute blood loss anemia 8.0.  Therapy evaluations completed with recommendations of physical medicine rehab consult.   Patient reports didn't sleep well overnight- trying to sleep during day today- LBM was before entered hospital, sometime last week.  Was putting a pillow under both AKAs, which puts him a risk for hip contractures esp on L, since no edema.     Review of Systems    Constitutional: Negative for chills and fever.  HENT: Negative for hearing loss.   Eyes: Negative for blurred vision and double vision.  Respiratory: Negative for cough and shortness of breath.   Cardiovascular: Positive for leg swelling. Negative for chest pain and palpitations.  Gastrointestinal: Positive for constipation. Negative for heartburn, nausea and vomiting.  Genitourinary: Positive for urgency. Negative for dysuria, flank pain and hematuria.  Musculoskeletal: Positive for joint pain and myalgias.  Skin: Negative for rash.  Psychiatric/Behavioral: Positive for depression. The patient has insomnia.   All other systems reviewed and are negative.  Past Medical History:  Diagnosis Date  . Hypertension    Past Surgical History:  Procedure Laterality Date  . AMPUTATION Left 12/24/2019   Procedure: LEFT ABOVE KNEE AMPUTATION;  Surgeon: Rosetta Posner, MD;  Location: [facility];  Service: Vascular;  Laterality: Left;  . AMPUTATION Right 01/26/2020   Procedure: AMPUTATION ABOVE KNEE;  Surgeon: Waynetta Sandy, MD;  Location: [facility];  Service: Vascular;  Laterality: Right;  . FEMORAL-POPLITEAL BYPASS GRAFT Right 12/30/2019   Procedure: RIGHT FEMORAL-POPLITEAL ARTERY BYPASS GRAFT;  Surgeon: Waynetta Sandy, MD;  Location: [facility];  Service: Vascular;  Laterality: Right;  . LOWER EXTREMITY ANGIOGRAPHY Right 12/28/2019   Procedure: LOWER EXTREMITY ANGIOGRAPHY;  Surgeon: Waynetta Sandy, MD;  Location: [facility];  Service: Cardiovascular;  Laterality: Right;   History reviewed. No pertinent family history. Social History:  reports that he has never smoked. He has never used smokeless tobacco. He reports that he does not drink alcohol or use drugs. Allergies:  Allergies  Allergen Reactions  . Ibuprofen Other (See Comments)    Patient  said "the little brown pills" that he bought from the store made him talk out of his head (reported by [facility])   . Other Other (See Comments)    Unknown reaction to "Dihydroxyalaminum aminoacetate magnesium carbonate" (reported by [facility]   Medications Prior to Admission  Medication Sig Dispense Refill  . atorvastatin (LIPITOR) 20 MG tablet Take 1 tablet (20 mg total) by mouth daily at 6 PM. 30 tablet 1  . buPROPion (WELLBUTRIN SR) 100 MG 12 hr tablet Take 1 tablet (100 mg total) by mouth 2 (two) times daily. 60 tablet 1  . cyanocobalamin 1000 MCG tablet Take 1 tablet (1,000 mcg total) by mouth daily. 30 tablet 1  . folic acid (FOLVITE) 1 MG tablet Take 1 tablet (1 mg total) by mouth daily. 30 tablet 0  . gabapentin (NEURONTIN) 100 MG capsule Take 1 capsule (100 mg total) by mouth 3 (three) times daily. 90 capsule 1  . oxyCODONE (OXY IR/ROXICODONE) 5 MG immediate release tablet Take 1-2 tablets (5-10 mg total) by mouth every 4 (four) hours as needed for moderate pain. 30 tablet 0  . pantoprazole (PROTONIX) 40 MG tablet Take 1 tablet (40 mg total) by mouth daily. 30 tablet 0  . acetaminophen (TYLENOL) 325 MG tablet Take 1-2 tablets (325-650 mg total) by mouth every 4 (four) hours as needed for mild pain (or temp >/= 101 F). (Patient not taking: Reported on 01/25/2020)    . aspirin EC 81 MG EC tablet Take 1 tablet (81 mg total) by mouth daily. (Patient not taking: Reported on 01/25/2020)    . bisacodyl (DULCOLAX) 5 MG EC tablet Take 1 tablet (5 mg total) by mouth daily as needed for moderate constipation. (Patient not taking: Reported on 01/25/2020) 30 tablet 0  . docusate sodium (COLACE) 100 MG capsule Take 1 capsule (100 mg total) by mouth 2 (two) times daily. (Patient not taking: Reported on 01/25/2020) 10 capsule 0  . polyethylene glycol (MIRALAX / GLYCOLAX) 17 g packet Take 17 g by mouth daily as needed for mild constipation. (Patient not taking: Reported on 01/25/2020) 14 each 0    Drug Regimen Review Drug regimen was reviewed and remains appropriate with no significant issues  identified  Home: Home Living Family/patient expects to be discharged to:: Private residence Living Arrangements: Spouse/significant other, Children, Other relatives Available Help at Discharge: Family, Available 24 hours/day Type of Home: House Home Access: Ramped entrance Home Layout: One level Bathroom Shower/Tub: Multimedia programmer: [place]: Wheelchair - manual, Bedside commode, Shower seat Additional Comments: Pt reports home is w/c accessible   Functional History: Prior Function Level of Independence: Needs assistance Gait / Transfers Assistance Needed: Pt using a wheelchair but was able to stand on R leg and pivot  Functional Status:  Mobility: Bed Mobility Overal bed mobility: Needs Assistance Bed Mobility: Supine to Sit Supine to sit: Min assist Transfers Overall transfer level: Needs assistance Equipment used: None Transfers: Government social research officer transfers: Min assist, +2 safety/equipment General transfer comment: Pt able to scoot from bed to recliner with increased time and cues Ambulation/Gait General Gait Details: deferred- no prosthesis for L AKA and new R AKA    ADL:    Cognition: Cognition Overall Cognitive Status: Within Functional Limits for tasks assessed Orientation Level: Oriented X4 Cognition Arousal/Alertness: Awake/alert Behavior During Therapy: WFL for tasks assessed/performed Overall Cognitive Status: Within Functional Limits for tasks assessed  Physical Exam: Blood pressure (!) 147/66, pulse 77, temperature 98.2 F (36.8  C), temperature source Oral, resp. rate 13, height 5\' 6"  (1.676 m), weight 54 kg, SpO2 100 %. Physical Exam  Nursing note and vitals reviewed. Constitutional: He is oriented to person, place, and time.  Frail, thin appearing male with  Very few teeth notable in mouth, sitting up in bed, watching TV, NAD; pillows propping up B/L AKAs, putting him at risk for hip  contractures.    HENT:  Head: Normocephalic and atraumatic.  Nose: Nose normal.  Mouth/Throat: No oropharyngeal exudate.  Eyes: Pupils are equal, round, and reactive to light. Conjunctivae and EOM are normal. Right eye exhibits no discharge. Left eye exhibits no discharge.  Neck: No tracheal deviation present.  Cardiovascular:  RRR  Respiratory:  CTA B/L- no W/R/R  GI:  slightly distended; NT, soft, hypoactive BS  Genitourinary:    Penis normal.     Genitourinary Comments: Urinal next to bed- medium amber urine in urinal.    Musculoskeletal:     Cervical back: Normal range of motion and neck supple.     Comments:  5-/5 in UEs- deltoids, biceps, triceps, WE, grip and finger abduction  HF at least 3+/5 in LEs- pain on R side R AKA wrapped in ACE wrap L AKA healed with very little muscle/very thin  Neurological: He is oriented to person, place, and time. No cranial nerve deficit.  Patient is alert.  Complains of not sleeping well.  Makes good eye contact with examiner and follows commands.  He is oriented x3. Sensation intact in UEs B/L and B/L AKAs  Skin:  Right AKA is dressed appropriately tender.  Left AKA site well-healed with some dry skin.  2 IV's in L forearm- no signs of infiltration.   Psychiatric:  More interactive than when last in rehab    Results for orders placed or performed during the hospital encounter of 01/25/20 (from the past 48 hour(s))  Respiratory Panel by RT PCR (Flu A&B, Covid) - Nasopharyngeal Swab     Status: None   Collection Time: 01/25/20  7:57 PM   Specimen: Nasopharyngeal Swab  Result Value Ref Range   SARS Coronavirus 2 by RT PCR NEGATIVE NEGATIVE    Comment: (NOTE) SARS-CoV-2 target nucleic acids are NOT DETECTED. The SARS-CoV-2 RNA is generally detectable in upper respiratoy specimens during the acute phase of infection. The lowest concentration of SARS-CoV-2 viral copies this assay can detect is 131 copies/mL. A negative result does not  preclude SARS-Cov-2 infection and should not be used as the sole basis for treatment or other patient management decisions. A negative result may occur with  improper specimen collection/handling, submission of specimen other than nasopharyngeal swab, presence of viral mutation(s) within the areas targeted by this assay, and inadequate number of viral copies (<131 copies/mL). A negative result must be combined with clinical observations, patient history, and epidemiological information. The expected result is Negative. Fact Sheet for Patients:  PinkCheek.be Fact Sheet for Healthcare Providers:  GravelBags.it This test is not yet ap proved or cleared by the [place] FDA and  has been authorized for detection and/or diagnosis of SARS-CoV-2 by FDA under an Emergency Use Authorization (EUA). This EUA will remain  in effect (meaning this test can be used) for the duration of the COVID-19 declaration under Section 564(b)(1) of the Act, 21 U.S.C. section 360bbb-3(b)(1), unless the authorization is terminated or revoked sooner.    Influenza A by PCR NEGATIVE NEGATIVE   Influenza B by PCR NEGATIVE NEGATIVE    Comment: (NOTE) The Xpert  Xpress SARS-CoV-2/FLU/RSV assay is intended as an aid in  the diagnosis of influenza from Nasopharyngeal swab specimens and  should not be used as a sole basis for treatment. Nasal washings and  aspirates are unacceptable for Xpert Xpress SARS-CoV-2/FLU/RSV  testing. Fact Sheet for Patients: PinkCheek.be Fact Sheet for Healthcare Providers: GravelBags.it This test is not yet approved or cleared by the [place] FDA and  has been authorized for detection and/or diagnosis of SARS-CoV-2 by  FDA under an Emergency Use Authorization (EUA). This EUA will remain  in effect (meaning this test can be used) for the duration of the  Covid-19  declaration under Section 564(b)(1) of the Act, 21  U.S.C. section 360bbb-3(b)(1), unless the authorization is  terminated or revoked. Performed at [facility], [place] [address] [place], [place] [address]   Urinalysis, Routine w reflex microscopic     Status: Abnormal   Collection Time: 01/25/20  9:11 PM  Result Value Ref Range   Color, Urine STRAW (A) YELLOW   APPearance CLEAR CLEAR   Specific Gravity, Urine 1.029 1.005 - 1.030   pH 6.0 5.0 - 8.0   Glucose, UA NEGATIVE NEGATIVE mg/dL   Hgb urine dipstick LARGE (A) NEGATIVE   Bilirubin Urine NEGATIVE NEGATIVE   Ketones, ur NEGATIVE NEGATIVE mg/dL   Protein, ur NEGATIVE NEGATIVE mg/dL   Nitrite NEGATIVE NEGATIVE   Leukocytes,Ua NEGATIVE NEGATIVE   RBC / HPF 11-20 0 - 5 RBC/hpf   WBC, UA 0-5 0 - 5 WBC/hpf   Bacteria, UA NONE SEEN NONE SEEN    Comment: Performed at [facility] [address], [place], [place] [address]  Comprehensive metabolic panel     Status: Abnormal   Collection Time: 01/25/20  9:15 PM  Result Value Ref Range   Sodium 135 135 - 145 mmol/L   Potassium 4.0 3.5 - 5.1 mmol/L   Chloride 102 98 - 111 mmol/L   CO2 22 22 - 32 mmol/L   Glucose, Bld 141 (H) 70 - 99 mg/dL   BUN 6 6 - 20 mg/dL   Creatinine, Ser 0.92 0.61 - 1.24 mg/dL   Calcium 8.9 8.9 - 10.3 mg/dL   Total Protein 7.6 6.5 - 8.1 g/dL   Albumin 1.9 (L) 3.5 - 5.0 g/dL   AST 13 (L) 15 - 41 U/L   ALT 12 0 - 44 U/L   Alkaline Phosphatase 124 38 - 126 U/L   Total Bilirubin <0.1 (L) 0.3 - 1.2 mg/dL   GFR calc non Af Amer >60 >60 mL/min   GFR calc Af Amer >60 >60 mL/min   Anion gap 11 5 - 15    Comment: Performed at [facility] [address] [place], [place] [address]  Lactic acid, plasma     Status: Abnormal   Collection Time: 01/25/20  9:15 PM  Result Value Ref Range   Lactic Acid, Venous 2.1 (HH) 0.5 - 1.9 mmol/L    Comment: CRITICAL RESULT CALLED TO, READ BACK BY AND VERIFIED WITH: RN K MUNNETT @2236  01/25/20 BY S  GEZAHEGN Performed at [facility], [place] [address] [place], [place] [address]   CBC with Differential     Status: Abnormal   Collection Time: 01/25/20  9:15 PM  Result Value Ref Range   WBC 12.4 (H) 4.0 - 10.5 K/uL   RBC 1.92 (L) 4.22 - 5.81 MIL/uL   Hemoglobin 5.6 (LL) 13.0 - 17.0 g/dL    Comment: REPEATED TO VERIFY  THIS CRITICAL RESULT HAS VERIFIED AND BEEN CALLED TO W MUNETTE,RN BY DENNIS BRADLEY ON 01 25 2021 AT 2224, AND HAS BEEN READ BACK.     HCT 18.3 (L) 39.0 - 52.0 %   MCV 95.3 80.0 - 100.0 fL   MCH 29.2 26.0 - 34.0 pg   MCHC 30.6 30.0 - 36.0 g/dL   RDW 16.9 (H) 11.5 - 15.5 %   Platelets 1,170 (HH) 150 - 400 K/uL    Comment: REPEATED TO VERIFY PLATELET COUNT CONFIRMED BY SMEAR SPECIMEN CHECKED FOR CLOTS THIS CRITICAL RESULT HAS VERIFIED AND BEEN CALLED TO W MUNETTE,RN BY DENNIS BRADLEY ON 01 25 2021 AT 2224, AND HAS BEEN READ BACK.     nRBC 0.0 0.0 - 0.2 %   Neutrophils Relative % 76 %   Neutro Abs 9.5 (H) 1.7 - 7.7 K/uL   Lymphocytes Relative 12 %   Lymphs Abs 1.4 0.7 - 4.0 K/uL   Monocytes Relative 9 %   Monocytes Absolute 1.2 (H) 0.1 - 1.0 K/uL   Eosinophils Relative 1 %   Eosinophils Absolute 0.2 0.0 - 0.5 K/uL   Basophils Relative 1 %   Basophils Absolute 0.1 0.0 - 0.1 K/uL   Immature Granulocytes 1 %   Abs Immature Granulocytes 0.08 (H) 0.00 - 0.07 K/uL    Comment: Performed at [facility] [address], [place], [place] [address]  Protime-INR     Status: None   Collection Time: 01/25/20  9:15 PM  Result Value Ref Range   Prothrombin Time 14.1 11.4 - 15.2 seconds   INR 1.1 0.8 - 1.2    Comment: (NOTE) INR goal varies based on device and disease states. Performed at [facility], [place] [address] [place], [place] [address]   Pathologist smear review     Status: None   Collection Time: 01/25/20  9:15 PM  Result Value Ref Range   Path Review Reviewed By Violet Baldy, M.D.     Comment: 01/26/2020 Normocytic anemia, leukocytosis  with hypersegmented neutrophils. Thrombocytosis. Serum Iron, Vitamin B12, and Folate levels recommended. Performed at [facility], [place] [address], [place], [place] [address]   Type and screen [place]     Status: None   Collection Time: 01/25/20  9:20 PM  Result Value Ref Range   ABO/RH(D) O POS    Antibody Screen NEG    Sample Expiration 01/28/2020,2359    Unit Number [id number]    Blood Component Type RED CELLS,LR    Unit division 00    Status of Unit ISSUED,FINAL    Transfusion Status OK TO TRANSFUSE    Crossmatch Result      Compatible Performed at [facility], [place] [address], [place], [place] [address]    Unit Number [id number]    Blood Component Type RED CELLS,LR    Unit division 00    Status of Unit ISSUED,FINAL    Transfusion Status OK TO TRANSFUSE    Crossmatch Result Compatible   Culture, blood (routine x 2)     Status: Abnormal (Preliminary result)   Collection Time: 01/25/20  9:20 PM   Specimen: BLOOD  Result Value Ref Range   Specimen Description BLOOD RIGHT ANTECUBITAL    Special Requests      BOTTLES DRAWN AEROBIC AND ANAEROBIC Blood Culture adequate volume   Culture  Setup Time      GRAM POSITIVE COCCI IN CLUSTERS IN BOTH AEROBIC AND ANAEROBIC BOTTLES CRITICAL  RESULT CALLED TO, READ BACK BY AND VERIFIED WITH: KRIS HAYES @1148  01/26/20 AKT    Culture (A)     STAPHYLOCOCCUS AUREUS SUSCEPTIBILITIES TO FOLLOW Performed at [facility], [place] [address], [place], [place] [address]    Report Status PENDING   Culture, blood (routine x 2)     Status: None (Preliminary result)   Collection Time: 01/25/20  9:22 PM   Specimen: BLOOD LEFT FOREARM  Result Value Ref Range   Specimen Description BLOOD LEFT FOREARM    Special Requests      BOTTLES DRAWN AEROBIC AND ANAEROBIC Blood Culture adequate volume   Culture      NO GROWTH 2 DAYS Performed at [facility], [place] [address] [place], [place]  [address]    Report Status PENDING   Prepare RBC     Status: None   Collection Time: 01/25/20 11:28 PM  Result Value Ref Range   Order Confirmation      ORDER PROCESSED BY BLOOD BANK Performed at [facility], [place] [address], [place], [place] [address]   Surgical pcr screen     Status: Abnormal   Collection Time: 01/25/20 11:37 PM   Specimen: Nasal Mucosa; Nasal Swab  Result Value Ref Range   MRSA, PCR POSITIVE (A) NEGATIVE    Comment: RESULT CALLED TO, READ BACK BY AND VERIFIED WITH: T IRBY RN 01/26/20 0108 JDW    Staphylococcus aureus POSITIVE (A) NEGATIVE    Comment: (NOTE) The Xpert SA Assay (FDA approved for NASAL specimens in patients 66 years of age and older), is one component of a comprehensive surveillance program. It is not intended to diagnose infection nor to guide or monitor treatment. Performed at [facility], [place] [address], [place], [place] [address]   Hemoglobin and hematocrit, blood     Status: Abnormal   Collection Time: 01/26/20  7:07 AM  Result Value Ref Range   Hemoglobin 8.6 (L) 13.0 - 17.0 g/dL    Comment: REPEATED TO VERIFY POST TRANSFUSION SPECIMEN    HCT 26.3 (L) 39.0 - 52.0 %    Comment: Performed at [facility] [address] [place],  [address]  I-STAT, [place] [address]     Status: Abnormal   Collection Time: 01/26/20 10:18 AM  Result Value Ref Range   Sodium 134 (L) 135 - 145 mmol/L   Potassium 4.1 3.5 - 5.1 mmol/L   Chloride 103 98 - 111 mmol/L   BUN 4 (L) 6 - 20 mg/dL   Creatinine, Ser 0.60 (L) 0.61 - 1.24 mg/dL   Glucose, Bld 100 (H) 70 - 99 mg/dL   Calcium, Ion 1.11 (L) 1.15 - 1.40 mmol/L   TCO2 23 22 - 32 mmol/L   Hemoglobin 8.2 (L) 13.0 - 17.0 g/dL   HCT 24.0 (L) 39.0 - 52.0 %  Glucose, capillary     Status: Abnormal   Collection Time: 01/26/20  9:39 PM  Result Value Ref Range   Glucose-Capillary 157 (H) 70 - 99 mg/dL  Basic metabolic panel     Status: Abnormal   Collection Time: 01/27/20  2:52 AM  Result Value  Ref Range   Sodium 136 135 - 145 mmol/L   Potassium 4.2 3.5 - 5.1 mmol/L   Chloride 103 98 - 111 mmol/L   CO2 23 22 - 32 mmol/L   Glucose, Bld 141 (H) 70 - 99 mg/dL   BUN 9 6 - 20 mg/dL   Creatinine, Ser  0.79 0.61 - 1.24 mg/dL   Calcium 8.3 (L) 8.9 - 10.3 mg/dL   GFR calc non Af Amer >60 >60 mL/min   GFR calc Af Amer >60 >60 mL/min   Anion gap 10 5 - 15    Comment: Performed at [facility] [address], [place], [place] [address]  CBC     Status: Abnormal   Collection Time: 01/27/20  2:52 AM  Result Value Ref Range   WBC 12.1 (H) 4.0 - 10.5 K/uL   RBC 2.69 (L) 4.22 - 5.81 MIL/uL   Hemoglobin 7.9 (L) 13.0 - 17.0 g/dL   HCT 25.0 (L) 39.0 - 52.0 %   MCV 92.9 80.0 - 100.0 fL   MCH 29.4 26.0 - 34.0 pg   MCHC 31.6 30.0 - 36.0 g/dL   RDW 15.9 (H) 11.5 - 15.5 %   Platelets 863 (H) 150 - 400 K/uL   nRBC 0.0 0.0 - 0.2 %    Comment: Performed at [facility], [place] [address] [place], [place] [address]   No results found.     Medical Problem List and Plan: 1.  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  Antithrombotics: -DVT/anticoagulation: Subcutaneous heparin  -antiplatelet therapy: Aspirin 81 mg daily 3. Pain Management: Neurontin 100 mg 3 times daily, oxycodone as needed 4. Mood: Wellbutrin 100 mg twice daily  -antipsychotic agents: N/A 5. Neuropsych: This patient is capable of making decisions on his own behalf. 6. Skin/Wound Care: Routine skin checks 7. Fluids/Electrolytes/Nutrition: Routine in and outs with follow-up chemistries 8.  Staph aureus bacteremia.  Follow-up infectious disease.  Repeat blood cultures pending.  Continue vancomycin and discussed duration with infectious disease; pharmacy  dosing Vanc 9.  Acute blood loss anemia.  Follow-up CBC 10.  Vitamin B12 deficiency.  Continue supplement 11.  Hyperlipidemia.  Lipitor 12.  History of tobacco and alcohol use.  Provide counseling.    Lavon Paganini Angiulli, PA-C 01/27/2020   I have personally performed a face to face diagnostic evaluation of this patient and formulated the key components of the plan.  Additionally, I have personally reviewed laboratory data, imaging studies, as well as relevant notes and concur with the physician assistant's documentation above.   The patient's status has not changed from the original H&P.  Any changes in documentation from the acute care chart have been noted above.

## 2020-01-28 ENCOUNTER — Other Ambulatory Visit: Payer: Self-pay

## 2020-01-28 ENCOUNTER — Encounter (HOSPITAL_COMMUNITY): Payer: Self-pay | Admitting: Physical Medicine and Rehabilitation

## 2020-01-28 ENCOUNTER — Inpatient Hospital Stay (HOSPITAL_COMMUNITY)
Admission: RE | Admit: 2020-01-28 | Discharge: 2020-02-08 | DRG: 560 | Disposition: A | Discharge status: Home Health | Payer: Medicaid Other | Source: Intra-hospital | Attending: Physical Medicine and Rehabilitation | Admitting: Physical Medicine and Rehabilitation

## 2020-01-28 DIAGNOSIS — Z8673 Personal history of transient ischemic attack (TIA), and cerebral infarction without residual deficits: Secondary | ICD-10-CM | POA: Diagnosis not present

## 2020-01-28 DIAGNOSIS — F17200 Nicotine dependence, unspecified, uncomplicated: Secondary | ICD-10-CM | POA: Diagnosis present

## 2020-01-28 DIAGNOSIS — R278 Other lack of coordination: Secondary | ICD-10-CM | POA: Diagnosis present

## 2020-01-28 DIAGNOSIS — Z89612 Acquired absence of left leg above knee: Secondary | ICD-10-CM | POA: Diagnosis not present

## 2020-01-28 DIAGNOSIS — G4700 Insomnia, unspecified: Secondary | ICD-10-CM | POA: Diagnosis not present

## 2020-01-28 DIAGNOSIS — I739 Peripheral vascular disease, unspecified: Secondary | ICD-10-CM | POA: Diagnosis present

## 2020-01-28 DIAGNOSIS — Z4781 Encounter for orthopedic aftercare following surgical amputation: Principal | ICD-10-CM

## 2020-01-28 DIAGNOSIS — D62 Acute posthemorrhagic anemia: Secondary | ICD-10-CM | POA: Diagnosis present

## 2020-01-28 DIAGNOSIS — Z716 Tobacco abuse counseling: Secondary | ICD-10-CM

## 2020-01-28 DIAGNOSIS — B9561 Methicillin susceptible Staphylococcus aureus infection as the cause of diseases classified elsewhere: Secondary | ICD-10-CM | POA: Diagnosis not present

## 2020-01-28 DIAGNOSIS — K5900 Constipation, unspecified: Secondary | ICD-10-CM | POA: Diagnosis not present

## 2020-01-28 DIAGNOSIS — Z79899 Other long term (current) drug therapy: Secondary | ICD-10-CM | POA: Diagnosis not present

## 2020-01-28 DIAGNOSIS — Z7982 Long term (current) use of aspirin: Secondary | ICD-10-CM | POA: Diagnosis not present

## 2020-01-28 DIAGNOSIS — E785 Hyperlipidemia, unspecified: Secondary | ICD-10-CM | POA: Diagnosis present

## 2020-01-28 DIAGNOSIS — R7881 Bacteremia: Secondary | ICD-10-CM | POA: Diagnosis present

## 2020-01-28 DIAGNOSIS — B9562 Methicillin resistant Staphylococcus aureus infection as the cause of diseases classified elsewhere: Secondary | ICD-10-CM | POA: Diagnosis present

## 2020-01-28 DIAGNOSIS — I96 Gangrene, not elsewhere classified: Secondary | ICD-10-CM | POA: Diagnosis not present

## 2020-01-28 DIAGNOSIS — E538 Deficiency of other specified B group vitamins: Secondary | ICD-10-CM | POA: Diagnosis present

## 2020-01-28 DIAGNOSIS — L299 Pruritus, unspecified: Secondary | ICD-10-CM | POA: Diagnosis not present

## 2020-01-28 DIAGNOSIS — Z89611 Acquired absence of right leg above knee: Secondary | ICD-10-CM

## 2020-01-28 DIAGNOSIS — Z7141 Alcohol abuse counseling and surveillance of alcoholic: Secondary | ICD-10-CM | POA: Diagnosis not present

## 2020-01-28 DIAGNOSIS — R3915 Urgency of urination: Secondary | ICD-10-CM | POA: Diagnosis present

## 2020-01-28 DIAGNOSIS — I10 Essential (primary) hypertension: Secondary | ICD-10-CM | POA: Diagnosis present

## 2020-01-28 LAB — BASIC METABOLIC PANEL
Anion gap: 14 (ref 5–15)
BUN: 7 mg/dL (ref 6–20)
CO2: 23 mmol/L (ref 22–32)
Calcium: 8.5 mg/dL — ABNORMAL LOW (ref 8.9–10.3)
Chloride: 101 mmol/L (ref 98–111)
Creatinine, Ser: 0.88 mg/dL (ref 0.61–1.24)
GFR calc Af Amer: >60 mL/min (ref >60)
GFR calc non Af Amer: >60 mL/min (ref >60)
Glucose, Bld: 111 mg/dL — ABNORMAL HIGH (ref 70–99)
Potassium: 4 mmol/L (ref 3.5–5.1)
Sodium: 138 mmol/L (ref 135–145)

## 2020-01-28 LAB — CBC
HCT: 25.4 % — ABNORMAL LOW (ref 39.0–52.0)
Hemoglobin: 8 g/dL — ABNORMAL LOW (ref 13.0–17.0)
MCH: 29.9 pg (ref 26.0–34.0)
MCHC: 31.5 g/dL (ref 30.0–36.0)
MCV: 94.8 fL (ref 80.0–100.0)
Platelets: 868 10*3/uL — ABNORMAL HIGH (ref 150–400)
RBC: 2.68 MIL/uL — ABNORMAL LOW (ref 4.22–5.81)
RDW: 16.1 % — ABNORMAL HIGH (ref 11.5–15.5)
WBC: 9.1 10*3/uL (ref 4.0–10.5)
nRBC: 0.2 % (ref 0.0–0.2)

## 2020-01-28 LAB — SURGICAL PATHOLOGY

## 2020-01-28 MED ORDER — PANTOPRAZOLE SODIUM 40 MG PO TBEC
40.0000 mg | DELAYED_RELEASE_TABLET | Freq: Every day | ORAL | Status: DC
  Administered 2020-01-29 – 2020-02-08 (×11): 40 mg via ORAL
  Filled 2020-01-28 (×11): qty 1

## 2020-01-28 MED ORDER — ATORVASTATIN CALCIUM 10 MG PO TABS
20.0000 mg | ORAL_TABLET | Freq: Every day | ORAL | Status: DC
  Administered 2020-01-28 – 2020-02-07 (×11): 20 mg via ORAL
  Filled 2020-01-28 (×11): qty 2

## 2020-01-28 MED ORDER — BUPROPION HCL ER (SR) 100 MG PO TB12
100.0000 mg | ORAL_TABLET | Freq: Two times a day (BID) | ORAL | Status: DC
  Administered 2020-01-28 – 2020-02-08 (×22): 100 mg via ORAL
  Filled 2020-01-28 (×23): qty 1

## 2020-01-28 MED ORDER — ACETAMINOPHEN 325 MG PO TABS
325.0000 mg | ORAL_TABLET | ORAL | Status: DC | PRN
  Administered 2020-01-28 – 2020-01-31 (×8): 650 mg via ORAL
  Administered 2020-02-01 (×2): 325 mg via ORAL
  Administered 2020-02-01 – 2020-02-08 (×19): 650 mg via ORAL
  Filled 2020-01-28 (×15): qty 2
  Filled 2020-01-28: qty 1
  Filled 2020-01-28 (×12): qty 2
  Filled 2020-01-28: qty 1
  Filled 2020-01-28: qty 2

## 2020-01-28 MED ORDER — DOCUSATE SODIUM 100 MG PO CAPS
100.0000 mg | ORAL_CAPSULE | Freq: Every day | ORAL | Status: DC
  Administered 2020-01-29 – 2020-02-08 (×10): 100 mg via ORAL
  Filled 2020-01-28 (×11): qty 1

## 2020-01-28 MED ORDER — ACETAMINOPHEN 650 MG RE SUPP
325.0000 mg | RECTAL | Status: DC | PRN
  Filled 2020-01-28: qty 1

## 2020-01-28 MED ORDER — VANCOMYCIN HCL 500 MG/100ML IV SOLN
500.0000 mg | Freq: Two times a day (BID) | INTRAVENOUS | Status: DC
  Administered 2020-01-28 – 2020-01-31 (×7): 500 mg via INTRAVENOUS
  Filled 2020-01-28 (×8): qty 100

## 2020-01-28 MED ORDER — GABAPENTIN 100 MG PO CAPS
100.0000 mg | ORAL_CAPSULE | Freq: Three times a day (TID) | ORAL | Status: DC
  Administered 2020-01-28 – 2020-02-08 (×33): 100 mg via ORAL
  Filled 2020-01-28 (×33): qty 1

## 2020-01-28 MED ORDER — HEPARIN SODIUM (PORCINE) 5000 UNIT/ML IJ SOLN
5000.0000 [IU] | Freq: Three times a day (TID) | INTRAMUSCULAR | Status: DC
  Administered 2020-01-28 – 2020-02-08 (×32): 5000 [IU] via SUBCUTANEOUS
  Filled 2020-01-28 (×32): qty 1

## 2020-01-28 MED ORDER — POLYETHYLENE GLYCOL 3350 17 G PO PACK
17.0000 g | PACK | Freq: Every day | ORAL | Status: DC | PRN
  Administered 2020-02-02: 17 g via ORAL
  Filled 2020-01-28: qty 1

## 2020-01-28 MED ORDER — SORBITOL 70 % SOLN: 30.0000 mL | Freq: Every day | Status: DC | PRN

## 2020-01-28 MED ORDER — VITAMIN B-12 1000 MCG PO TABS
1000.0000 ug | ORAL_TABLET | Freq: Every day | ORAL | Status: DC
  Administered 2020-01-29 – 2020-02-08 (×11): 1000 ug via ORAL
  Filled 2020-01-28 (×11): qty 1

## 2020-01-28 MED ORDER — OXYCODONE HCL 5 MG PO TABS
5.0000 mg | ORAL_TABLET | ORAL | Status: DC | PRN
  Administered 2020-01-29: 5 mg via ORAL
  Administered 2020-01-30 – 2020-02-02 (×6): 10 mg via ORAL
  Filled 2020-01-28 (×4): qty 2
  Filled 2020-01-28: qty 1
  Filled 2020-01-28 (×3): qty 2

## 2020-01-28 MED ORDER — HEPARIN SODIUM (PORCINE) 5000 UNIT/ML IJ SOLN: 5000.0000 [IU] | Freq: Three times a day (TID) | INTRAMUSCULAR | Status: DC

## 2020-01-28 MED ORDER — ASPIRIN EC 81 MG PO TBEC
81.0000 mg | DELAYED_RELEASE_TABLET | Freq: Every day | ORAL | Status: DC
  Administered 2020-01-29 – 2020-02-08 (×11): 81 mg via ORAL
  Filled 2020-01-28 (×11): qty 1

## 2020-01-28 MED ORDER — FOLIC ACID 1 MG PO TABS
1.0000 mg | ORAL_TABLET | Freq: Every day | ORAL | Status: DC
  Administered 2020-01-29 – 2020-02-08 (×11): 1 mg via ORAL
  Filled 2020-01-28 (×11): qty 1

## 2020-01-28 NOTE — Progress Notes (Signed)
Patient arrived on unit. No signs and symptoms of distress. Pain noted to right AKA with non pitting edema present. Dressing intact. Skin assessment done. One small round open area to right side buttocks with foam to cover, right below is a 4.5 cm length, 2.5 cm width stage 2 with bloody drainage and foam to cover. Bony coccyx, no open areas. No further complications noted at this moment.  Romana Juniper, LPN

## 2020-01-28 NOTE — Progress Notes (Signed)
Courtney Heys, MD  Physician  Physical Medicine and Rehabilitation  PMR Pre-admission  Signed  Date of Service:  01/28/2020 10:01 AM      Related encounter: ED to Hosp-Admission (Discharged) from 01/25/2020 in [facility] 4E CV SURGICAL PROGRESSIVE CARE      Signed        Show:Clear all _0 Manual_1 Template_2 Copied  Added by: _3 Cristina Gong, RN_4 Courtney Heys, MD  _5 Hover for details PMR Admission Coordinator Pre-Admission Assessment   Patient: Terrence Carr is an 59 y.o., male MRN: [id number] DOB: 06/05/1961 Height: _6  (167.6 cm) Weight: 54 kg(Bed zeroed for new patient )   Insurance Information HMO:     PPO:      PCP:      IPA:      80/20:      OTHER:  PRIMARY: Medicaid [place] Access      Policy#: [id number]      Subscriber: pt Benefits:  Phone #: passport one online     Name: 1/28 Eff. Date: active 1/28    Per medicaid guidelines   Medicaid Application Date:       Case Manager:  Disability Application Date:       Case Worker:    The "Data Collection Information Summary" for patients in Inpatient Rehabilitation Facilities with attached "Privacy Act [place] Records" was provided and verbally reviewed with: N/A   Emergency Contact Information         Contact Information     Name Relation Home Work [place], [place]   Brandon, Alfarata Granddaughter     [phone]         Current Medical History  Patient Admitting Diagnosis: Bilateral AKA's   History of Present Illness:  59 year old right-handed male with history of hypertension, tobacco and alcohol use as well as vitamin B12 deficiency with peripheral vascular disease revascularization of right iliac artery stenting placement by Dr. 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 Donnetta Hutching as well as right common femoral to below-knee popliteal artery bypass 12/30/2019 receiving inpatient rehab services 12/30/2020  until  01/08/2020 and discharged to home maintained on aspirin. Presented 01/25/2020 after being seen by vascular surgery and follow-up with noted extensive gangrenous changes of the right foot.Admission labs hemoglobin 5.6 and transfused 2 units PRBC.  Limb was not felt to be salvageable patient initially somewhat resistant to surgical intervention and underwent right AKA 01/26/2020 per Dr. Donzetta Matters.  Noted blood cultures did show 1/2 staph aureus.  Patient initially placed on vancomycin with follow-up per infectious disease.  Transthoracic echocardiogram with ejection fraction of 65%.  No left ventricular hypertrophy.  No regional wall motion abnormalities.  As well as follow-up repeat blood cultures pending.  Subcutaneous heparin for DVT prophylaxis.  Acute blood loss anemia 8.0.     Patient's [facility] has been reviewed by the rehabilitation admission coordinator and physician.   Past Medical History      Past Medical History:  Diagnosis Date  . Hypertension        Family History   family history is not on file.   Prior Rehab/Hospitalizations Has the patient had prior rehab or hospitalizations prior to admission? Yes CIR 01/2020 for 8 days   Has the patient had major surgery during 100 days prior to admission? Yes              Current Medications  Current Facility-Administered Medications:  .  0.9 %  sodium chloride infusion, 500 mL, Intravenous, Once PRN, Dagoberto Ligas, PA-C .  acetaminophen (TYLENOL) tablet 325-650 mg, 325-650 mg, Oral, Q4H PRN, 650 mg at 01/28/20 0602 **OR** acetaminophen (TYLENOL) suppository 325-650 mg, 325-650 mg, Rectal, Q4H PRN, Eveland, Matthew, PA-C .  alum & mag hydroxide-simeth (MAALOX/MYLANTA) 200-200-20 MG/5ML suspension 15-30 mL, 15-30 mL, Oral, Q2H PRN, Eveland, Matthew, PA-C .  aspirin EC tablet 81 mg, 81 mg, Oral, Daily, Dagoberto Ligas, PA-C, 81 mg at 01/28/20 0834 .  atorvastatin (LIPITOR) tablet 20 mg, 20 mg, Oral, q1800,  Dagoberto Ligas, PA-C, 20 mg at 01/27/20 1738 .  buPROPion [facility] SR) 12 hr tablet 100 mg, 100 mg, Oral, BID, Dagoberto Ligas, PA-C, 100 mg at 01/28/20 0603 .  Chlorhexidine Gluconate Cloth 2 % PADS 6 each, 6 each, Topical, Q0600, Dagoberto Ligas, PA-C, 6 each at 01/28/20 0904 .  docusate sodium (COLACE) capsule 100 mg, 100 mg, Oral, Daily, Dagoberto Ligas, PA-C, 100 mg at 01/28/20 8250 .  folic acid (FOLVITE) tablet 1 mg, 1 mg, Oral, Daily, Dagoberto Ligas, PA-C, 1 mg at 01/28/20 0834 .  gabapentin (NEURONTIN) capsule 100 mg, 100 mg, Oral, TID, Dagoberto Ligas, PA-C, 100 mg at 01/28/20 5397 .  guaiFENesin-dextromethorphan (ROBITUSSIN DM) 100-10 MG/5ML syrup 15 mL, 15 mL, Oral, Q4H PRN, Dagoberto Ligas, PA-C .  heparin injection 5,000 Units, 5,000 Units, Subcutaneous, Q8H, Dagoberto Ligas, PA-C, 5,000 Units at 01/28/20 6734 .  hydrALAZINE (APRESOLINE) injection 5 mg, 5 mg, Intravenous, Q20 Min PRN, Eveland, Matthew, PA-C .  labetalol (NORMODYNE) injection 10 mg, 10 mg, Intravenous, Q10 min PRN, Eveland, Matthew, PA-C .  magnesium sulfate IVPB 2 g 50 mL, 2 g, Intravenous, Daily PRN, Dagoberto Ligas, PA-C .  metoprolol tartrate (LOPRESSOR) injection 2-5 mg, 2-5 mg, Intravenous, Q2H PRN, Eveland, Matthew, PA-C .  mupirocin ointment (BACTROBAN) 2 % 1 application, 1 application, Nasal, BID, Dagoberto Ligas, PA-C, 1 application at 19/37/90 0834 .  ondansetron (ZOFRAN) injection 4 mg, 4 mg, Intravenous, Q6H PRN, Eveland, Matthew, PA-C .  oxyCODONE (Oxy IR/ROXICODONE) immediate release tablet 5-10 mg, 5-10 mg, Oral, Q4H PRN, Eveland, Matthew, PA-C .  pantoprazole (PROTONIX) EC tablet 40 mg, 40 mg, Oral, Daily, Dagoberto Ligas, PA-C, 40 mg at 01/28/20 2409 .  phenol (CHLORASEPTIC) mouth spray 1 spray, 1 spray, Mouth/Throat, PRN, Eveland, Matthew, PA-C .  polyethylene glycol (MIRALAX / GLYCOLAX) packet 17 g, 17 g, Oral, Daily PRN, Eveland, Matthew, PA-C .  potassium chloride SA (KLOR-CON) CR  tablet 20-40 mEq, 20-40 mEq, Oral, Daily PRN, Cameron Proud, Matthew, PA-C .  vancomycin (VANCOREADY) IVPB 500 mg/100 mL, 500 mg, Intravenous, Q12H, Susa Raring, RPH, Last Rate: 100 mL/hr at 01/28/20 0850, 500 mg at 01/28/20 0850 .  vitamin B-12 (CYANOCOBALAMIN) tablet 1,000 mcg, 1,000 mcg, Oral, Daily, Dagoberto Ligas, PA-C, 1,000 mcg at 01/28/20 7353   Patients Current Diet:     Diet Order                      Diet Heart Room service appropriate? Yes with Assist; Fluid consistency: Thin  Diet effective now                   Precautions / Restrictions Precautions Precautions: Fall Precaution Comments: Bil AKA Restrictions Weight Bearing Restrictions: Yes RLE Weight Bearing: Non weight bearing LLE Weight Bearing: Non weight bearing    Has the patient had 2 or more falls or a fall with injury in the past year?  No   Prior Activity Level Limited Community (1-2x/wk): stand pivot transfers to w/c since CIR admit 1/21   Prior Functional Level Self Care: Did the patient need help bathing, dressing, using the toilet or eating? Needed some help   Indoor Mobility: Did the patient need assistance with walking from room to room (with or without device)? Needed some help   Stairs: Did the patient need assistance with internal or external stairs (with or without device)? Needed some help   Functional Cognition: Did the patient need help planning regular tasks such as shopping or remembering to take medications? Needed some help   Home Assistive Devices / Equipment Home Equipment: (16 X 16 light weight w/c, cushion and drop arm commode)   Prior Device Use: Indicate devices/aids used by the patient prior to current illness, exacerbation or injury? Manual wheelchair   Current Functional Level Cognition   Overall Cognitive Status: Within Functional Limits for tasks assessed Orientation Level: Oriented X4    Extremity Assessment (includes Sensation/Coordination)   Upper Extremity  Assessment: Generalized weakness  Lower Extremity Assessment: LLE deficits/detail, RLE deficits/detail RLE Deficits / Details: ROM limited by pain , tends to keep hip flexed/elevated (encouraged resting straight as able) LLE Deficits / Details: Demontrating ROM WFL of hip     ADLs   Overall ADL's : Needs assistance/impaired Eating/Feeding: Set up, Sitting Grooming: Set up, Sitting Upper Body Bathing: Minimal assistance, Sitting Lower Body Bathing: Maximal assistance, Sitting/lateral leans Upper Body Dressing : Minimal assistance, Sitting Lower Body Dressing: Maximal assistance, Sitting/lateral leans, Bed level General ADL Comments: Pt will benefit from post acute rehab     Mobility   Overal bed mobility: Needs Assistance Bed Mobility: Supine to Sit Supine to sit: Min assist     Transfers   Overall transfer level: Needs assistance Equipment used: None Transfers: Government social research officer transfers: Min assist, +2 safety/equipment General transfer comment: Pt able to scoot from bed to recliner with increased time and cues     Ambulation / Gait / Stairs / Wheelchair Mobility   Ambulation/Gait General Gait Details: deferred- no prosthesis for L AKA and new R AKA     Posture / Balance Balance Overall balance assessment: Needs assistance Sitting-balance support: Bilateral upper extremity supported, Feet unsupported Sitting balance-Leahy Scale: Fair Standing balance comment: deferred     Special needs/care consideration BiPAP/CPAP  CPM  Continuous Drip IV  Dialysis         Life Vest  Oxygen  Special Bed  Trach Size  Wound Vac Skin  Surgical incisions Bowel mgmt: LBM 01/24/2020 Bladder mgmt: continent Diabetic mgmt:  Behavioral consideration  Chemo/radiation  Designated visitor is Event organiser    Previous Home Environment  Living Arrangements: Spouse/significant other, Children, Other relatives  Lives With: Significant other, Other (Comment) Available  Help at Discharge: Family, Available 24 hours/day Type of Home: House Home Layout: One level Home Access: Ramped entrance Entrance Stairs-Number of Steps: 1 Bathroom Shower/Tub: Multimedia programmer: Standard Bathroom Accessibility: Yes How Accessible: Accessible via wheelchair [place]: Yes Type of [place]: Home PT, [place] (if known): [facility] at d/c 01/2020 Additional Comments: Pt reports home is w/c accessible   Discharge Living Setting Plans for Discharge Living Setting: Patient's home, Lives with (comment)(girlfriend, other family and relatives) Type of Home at Discharge: House Discharge Home Layout: One level Discharge Home Access: [place] entrance Discharge Bathroom Shower/Tub: Walk-in shower Discharge Bathroom Toilet: Standard Discharge Bathroom Accessibility: Yes How Accessible: Accessible via wheelchair  Does the patient have any problems obtaining your medications?: No   Social/Family/Support Systems Patient Roles: Partner Contact Information: Graylon Good step daughter Anticipated Caregiver: Graylon Good and Lasha Anticipated Caregiver's Contact Information: [phone] Ability/Limitations of Caregiver: Graylon Good works, Mamie Laurel is there when Graylon Good is working to care for [name] and patient Caregiver Availability: 24/7 Discharge Plan Discussed with Primary Caregiver: Yes Is Caregiver In Agreement with Plan?: Yes Does Caregiver/Family have Issues with Lodging/Transportation while Pt is in Rehab?: No   Goals/Additional Needs Patient/Family Goal for Rehab: supervision with PT and OT at wheelchair level Expected length of stay: 5-7 days Pt/Family Agrees to Admission and willing to participate: Yes Program Orientation Provided & Reviewed with Pt/Caregiver Including Roles  & Responsibilities: Yes   Decrease burden of Care through IP rehab admission:    Possible need for SNF placement upon discharge:    Patient Condition: I have  reviewed medical records from [facility], spoken with CM, and patient and family member. I met with patient at the bedside for inpatient rehabilitation assessment.  Patient will benefit from ongoing PT and OT, can actively participate in 3 hours of therapy a day 5 days of the week, and can make measurable gains during the admission.  Patient will also benefit from the coordinated team approach during an Inpatient Acute Rehabilitation admission.  The patient will receive intensive therapy as well as Rehabilitation physician, nursing, social worker, and care management interventions.  Due to bladder management, bowel management, safety, skin/wound care, disease management, medication administration, pain management and patient education the patient requires 24 hour a day rehabilitation nursing.  The patient is currently min to mod assist with mobility and basic ADLs.  Discharge setting and therapy post discharge at home with home health is anticipated.  Patient has agreed to participate in the Acute Inpatient Rehabilitation Program and will admit today.   Preadmission Screen Completed By:  Cleatrice Burke, 01/28/2020 10:02 AM ______________________________________________________________________   Discussed status with Dr. Dagoberto Ligas on  01/28/2020 at 1007 and received approval for admission today.   Admission Coordinator:  Cleatrice Burke, RN, time  8786 Date  01/28/2020    Assessment/Plan: Diagnosis: 1. Does the need for close, 24 hr/day Medical supervision in concert with the patient's rehab needs make it unreasonable for this patient to be served in a less intensive setting? Yes 2. Co-Morbidities requiring supervision/potential complications: L AKA 76/72, HTN alcohol abuse, tobacco abuse, gangrene but R AKA done 1/26 3. Due to bowel management, safety, skin/wound care, disease management, medication administration, pain management and patient education, does the patient require 24  hr/day rehab nursing? Yes 4. Does the patient require coordinated care of a physician, rehab nurse, PT, OT, and SLP to address physical and functional deficits in the context of the above medical diagnosis(es)? Yes Addressing deficits in the following areas: balance, endurance, strength, transferring, bathing, dressing, feeding, grooming and toileting 5. Can the patient actively participate in an intensive therapy program of at least 3 hrs of therapy 5 days a week? Yes 6. The potential for patient to make measurable gains while on inpatient rehab is good 7. Anticipated functional outcomes upon discharge from inpatient rehab: supervision PT, supervision OT, n/a SLP 8. Estimated rehab length of stay to reach the above functional goals is: 5-7 days 9. Anticipated discharge destination: Home 10. Overall Rehab/Functional Prognosis: good     MD Signature:          Revision History

## 2020-01-28 NOTE — PMR Pre-admission (Signed)
PMR Admission Coordinator Pre-Admission Assessment  Patient: Terrence Carr is an 59 y.o., male MRN: [id number] DOB: 10/10/1961 Height: 5' 6" (167.6 cm) Weight: 54 kg(Bed zeroed for new patient )  Insurance Information HMO:     PPO:      PCP:      IPA:      80/20:      OTHER:  PRIMARY: Medicaid [place] Access      Policy#: [id number]      Subscriber: pt Benefits:  Phone #: passport one online     Name: 1/28 Eff. Date: active 1/28    Per medicaid guidelines  Medicaid Application Date:       Case Manager:  Disability Application Date:       Case Worker:   The "Data Collection Information Summary" for patients in Inpatient Rehabilitation Facilities with attached "Privacy Act [place] Records" was provided and verbally reviewed with: N/A  Emergency Contact Information Contact Information    Name Relation Home Work [place], [place]  [place], [place] Granddaughter   [phone]      Current Medical History  Patient Admitting Diagnosis: Bilateral AKA's  History of Present Illness:  58 year old right-handed male with history of hypertension, tobacco and alcohol use as well as vitamin B12 deficiency with peripheral vascular disease revascularization of right iliac artery stenting placement by Dr. 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 Donnetta Hutching as well as right common femoral to below-knee popliteal artery bypass 12/30/2019 receiving inpatient rehab services 12/30/2020 until  01/08/2020 and discharged to home maintained on aspirin. Presented 01/25/2020 after being seen by vascular surgery and follow-up with noted extensive gangrenous changes of the right foot.Admission labs hemoglobin 5.6 and transfused 2 units PRBC.  Limb was not felt to be salvageable patient initially somewhat resistant to surgical intervention and underwent right AKA 01/26/2020 per Dr. Donzetta Matters.  Noted blood cultures did show 1/2  staph aureus.  Patient initially placed on vancomycin with follow-up per infectious disease.  Transthoracic echocardiogram with ejection fraction of 65%.  No left ventricular hypertrophy.  No regional wall motion abnormalities.  As well as follow-up repeat blood cultures pending.  Subcutaneous heparin for DVT prophylaxis.  Acute blood loss anemia 8.0.    Patient's medical record from [facility] has been reviewed by the rehabilitation admission coordinator and physician.  Past Medical History  Past Medical History:  Diagnosis Date  . Hypertension     Family History   family history is not on file.  Prior Rehab/Hospitalizations Has the patient had prior rehab or hospitalizations prior to admission? Yes CIR 01/2020 for 8 days  Has the patient had major surgery during 100 days prior to admission? Yes   Current Medications  Current Facility-Administered Medications:  .  0.9 %  sodium chloride infusion, 500 mL, Intravenous, Once PRN, Dagoberto Ligas, PA-C .  acetaminophen (TYLENOL) tablet 325-650 mg, 325-650 mg, Oral, Q4H PRN, 650 mg at 01/28/20 0602 **OR** acetaminophen (TYLENOL) suppository 325-650 mg, 325-650 mg, Rectal, Q4H PRN, Eveland, Matthew, PA-C .  alum & mag hydroxide-simeth (MAALOX/MYLANTA) 200-200-20 MG/5ML suspension 15-30 mL, 15-30 mL, Oral, Q2H PRN, Eveland, Matthew, PA-C .  aspirin EC tablet 81 mg, 81 mg, Oral, Daily, Dagoberto Ligas, PA-C, 81 mg at 01/28/20 0834 .  atorvastatin (LIPITOR) tablet 20 mg, 20 mg, Oral, q1800, Dagoberto Ligas, PA-C, 20 mg at 01/27/20 1738 .  buPROPion (WELLBUTRIN SR) 12 hr tablet 100 mg,  100 mg, Oral, BID, Dagoberto Ligas, PA-C, 100 mg at 01/28/20 2505 .  Chlorhexidine Gluconate Cloth 2 % PADS 6 each, 6 each, Topical, Q0600, Dagoberto Ligas, PA-C, 6 each at 01/28/20 0904 .  docusate sodium (COLACE) capsule 100 mg, 100 mg, Oral, Daily, Dagoberto Ligas, PA-C, 100 mg at 01/28/20 3976 .  folic acid (FOLVITE) tablet 1 mg, 1 mg, Oral,  Daily, Dagoberto Ligas, PA-C, 1 mg at 01/28/20 0834 .  gabapentin (NEURONTIN) capsule 100 mg, 100 mg, Oral, TID, Dagoberto Ligas, PA-C, 100 mg at 01/28/20 7341 .  guaiFENesin-dextromethorphan (ROBITUSSIN DM) 100-10 MG/5ML syrup 15 mL, 15 mL, Oral, Q4H PRN, Dagoberto Ligas, PA-C .  heparin injection 5,000 Units, 5,000 Units, Subcutaneous, Q8H, Dagoberto Ligas, PA-C, 5,000 Units at 01/28/20 9379 .  hydrALAZINE (APRESOLINE) injection 5 mg, 5 mg, Intravenous, Q20 Min PRN, Eveland, Matthew, PA-C .  labetalol (NORMODYNE) injection 10 mg, 10 mg, Intravenous, Q10 min PRN, Eveland, Matthew, PA-C .  magnesium sulfate IVPB 2 g 50 mL, 2 g, Intravenous, Daily PRN, Dagoberto Ligas, PA-C .  metoprolol tartrate (LOPRESSOR) injection 2-5 mg, 2-5 mg, Intravenous, Q2H PRN, Eveland, Matthew, PA-C .  mupirocin ointment (BACTROBAN) 2 % 1 application, 1 application, Nasal, BID, Dagoberto Ligas, PA-C, 1 application at 02/40/97 0834 .  ondansetron (ZOFRAN) injection 4 mg, 4 mg, Intravenous, Q6H PRN, Eveland, Matthew, PA-C .  oxyCODONE (Oxy IR/ROXICODONE) immediate release tablet 5-10 mg, 5-10 mg, Oral, Q4H PRN, Eveland, Matthew, PA-C .  pantoprazole (PROTONIX) EC tablet 40 mg, 40 mg, Oral, Daily, Dagoberto Ligas, PA-C, 40 mg at 01/28/20 3532 .  phenol (CHLORASEPTIC) mouth spray 1 spray, 1 spray, Mouth/Throat, PRN, Eveland, Matthew, PA-C .  polyethylene glycol (MIRALAX / GLYCOLAX) packet 17 g, 17 g, Oral, Daily PRN, Eveland, Matthew, PA-C .  potassium chloride SA (KLOR-CON) CR tablet 20-40 mEq, 20-40 mEq, Oral, Daily PRN, Cameron Proud, Matthew, PA-C .  vancomycin (VANCOREADY) IVPB 500 mg/100 mL, 500 mg, Intravenous, Q12H, Susa Raring, RPH, Last Rate: 100 mL/hr at 01/28/20 0850, 500 mg at 01/28/20 0850 .  vitamin B-12 (CYANOCOBALAMIN) tablet 1,000 mcg, 1,000 mcg, Oral, Daily, Dagoberto Ligas, PA-C, 1,000 mcg at 01/28/20 9924  Patients Current Diet:  Diet Order            Diet Heart Room service appropriate? Yes  with Assist; Fluid consistency: Thin  Diet effective now              Precautions / Restrictions Precautions Precautions: Fall Precaution Comments: Bil AKA Restrictions Weight Bearing Restrictions: Yes RLE Weight Bearing: Non weight bearing LLE Weight Bearing: Non weight bearing   Has the patient had 2 or more falls or a fall with injury in the past year? No  Prior Activity Level Limited Community (1-2x/wk): stand pivot transfers to w/c since CIR admit 1/21  Prior Functional Level Self Care: Did the patient need help bathing, dressing, using the toilet or eating? Needed some help  Indoor Mobility: Did the patient need assistance with walking from room to room (with or without device)? Needed some help  Stairs: Did the patient need assistance with internal or external stairs (with or without device)? Needed some help  Functional Cognition: Did the patient need help planning regular tasks such as shopping or remembering to take medications? Needed some help  Home Assistive Devices / Equipment Home Equipment: (16 X 16 light weight w/c, cushion and drop arm commode)  Prior Device Use: Indicate devices/aids used by the patient prior to current illness, exacerbation or injury? Manual wheelchair  Current Functional Level Cognition  Overall Cognitive Status: Within Functional Limits for tasks assessed Orientation Level: Oriented X4    Extremity Assessment (includes Sensation/Coordination)  Upper Extremity Assessment: Generalized weakness  Lower Extremity Assessment: LLE deficits/detail, RLE deficits/detail RLE Deficits / Details: ROM limited by pain , tends to keep hip flexed/elevated (encouraged resting straight as able) LLE Deficits / Details: Demontrating ROM WFL of hip    ADLs  Overall ADL's : Needs assistance/impaired Eating/Feeding: Set up, Sitting Grooming: Set up, Sitting Upper Body Bathing: Minimal assistance, Sitting Lower Body Bathing: Maximal assistance,  Sitting/lateral leans Upper Body Dressing : Minimal assistance, Sitting Lower Body Dressing: Maximal assistance, Sitting/lateral leans, Bed level General ADL Comments: Pt will benefit from post acute rehab    Mobility  Overal bed mobility: Needs Assistance Bed Mobility: Supine to Sit Supine to sit: Min assist    Transfers  Overall transfer level: Needs assistance Equipment used: None Transfers: Government social research officer transfers: Min assist, +2 safety/equipment General transfer comment: Pt able to scoot from bed to recliner with increased time and cues    Ambulation / Gait / Stairs / Wheelchair Mobility  Ambulation/Gait General Gait Details: deferred- no prosthesis for L AKA and new R AKA    Posture / Balance Balance Overall balance assessment: Needs assistance Sitting-balance support: Bilateral upper extremity supported, Feet unsupported Sitting balance-Leahy Scale: Fair Standing balance comment: deferred    Special needs/care consideration BiPAP/CPAP  CPM  Continuous Drip IV  Dialysis         Life Vest  Oxygen  Special Bed  Trach Size  Wound Vac Skin  Surgical incisions Bowel mgmt: LBM 01/24/2020 Bladder mgmt: continent Diabetic mgmt:  Behavioral consideration  Chemo/radiation  Designated visitor is Event organiser   Previous Home Environment  Living Arrangements: Spouse/significant other, Children, Other relatives  Lives With: Significant other, Other (Comment) Available Help at Discharge: Family, Available 24 hours/day Type of Home: House Home Layout: One level Home Access: Ramped entrance Entrance Stairs-Number of Steps: 1 Bathroom Shower/Tub: Multimedia programmer: Standard Bathroom Accessibility: Yes How Accessible: Accessible via wheelchair [place]: Yes Type of [place]: Home PT, [place] (if known): [facility] at d/c 01/2020 Additional Comments: Pt reports home is w/c accessible  Discharge  Living Setting Plans for Discharge Living Setting: Patient's home, Lives with (comment)(girlfriend, other family and relatives) Type of Home at Discharge: House Discharge Home Layout: One level Discharge Home Access: Orange entrance Discharge Bathroom Shower/Tub: Walk-in shower Discharge Bathroom Toilet: Standard Discharge Bathroom Accessibility: Yes How Accessible: Accessible via wheelchair Does the patient have any problems obtaining your medications?: No  Social/Family/Support Systems Patient Roles: Partner Contact Information: [place] step daughter Anticipated Caregiver: Graylon Good and Lasha Anticipated Caregiver's Contact Information: [phone] Ability/Limitations of Caregiver: Graylon Good works, Mamie Laurel is there when Graylon Good is working to care for [name] and patient Caregiver Availability: 24/7 Discharge Plan Discussed with Primary Caregiver: Yes Is Caregiver In Agreement with Plan?: Yes Does Caregiver/Family have Issues with Lodging/Transportation while Pt is in Rehab?: No  Goals/Additional Needs Patient/Family Goal for Rehab: supervision with PT and OT at wheelchair level Expected length of stay: 5-7 days Pt/Family Agrees to Admission and willing to participate: Yes Program Orientation Provided & Reviewed with Pt/Caregiver Including Roles  & Responsibilities: Yes  Decrease burden of Care through IP rehab admission:   Possible need for SNF placement upon discharge:   Patient Condition: I have reviewed medical records from [facility], spoken with CM, and patient and family member. I  met with patient at the bedside for inpatient rehabilitation assessment.  Patient will benefit from ongoing PT and OT, can actively participate in 3 hours of therapy a day 5 days of the week, and can make measurable gains during the admission.  Patient will also benefit from the coordinated team approach during an Inpatient Acute Rehabilitation admission.  The patient will receive intensive therapy  as well as Rehabilitation physician, nursing, social worker, and care management interventions.  Due to bladder management, bowel management, safety, skin/wound care, disease management, medication administration, pain management and patient education the patient requires 24 hour a day rehabilitation nursing.  The patient is currently min to mod assist with mobility and basic ADLs.  Discharge setting and therapy post discharge at home with home health is anticipated.  Patient has agreed to participate in the Acute Inpatient Rehabilitation Program and will admit today.  Preadmission Screen Completed By:  Cleatrice Burke, 01/28/2020 10:02 AM ______________________________________________________________________   Discussed status with Dr. Dagoberto Ligas on  01/28/2020 at 1007 and received approval for admission today.  Admission Coordinator:  Cleatrice Burke, RN, time  6301 Date  01/28/2020   Assessment/Plan: Diagnosis: 1. Does the need for close, 24 hr/day Medical supervision in concert with the patient's rehab needs make it unreasonable for this patient to be served in a less intensive setting? Yes 2. Co-Morbidities requiring supervision/potential complications: L AKA 60/10, HTN alcohol abuse, tobacco abuse, gangrene but R AKA done 1/26 3. Due to bowel management, safety, skin/wound care, disease management, medication administration, pain management and patient education, does the patient require 24 hr/day rehab nursing? Yes 4. Does the patient require coordinated care of a physician, rehab nurse, PT, OT, and SLP to address physical and functional deficits in the context of the above medical diagnosis(es)? Yes Addressing deficits in the following areas: balance, endurance, strength, transferring, bathing, dressing, feeding, grooming and toileting 5. Can the patient actively participate in an intensive therapy program of at least 3 hrs of therapy 5 days a week? Yes 6. The potential for patient  to make measurable gains while on inpatient rehab is good 7. Anticipated functional outcomes upon discharge from inpatient rehab: supervision PT, supervision OT, n/a SLP 8. Estimated rehab length of stay to reach the above functional goals is: 5-7 days 9. Anticipated discharge destination: Home 10. Overall Rehab/Functional Prognosis: good   MD Signature:

## 2020-01-28 NOTE — Progress Notes (Signed)
Report given to [facility] on 4W. Pt taken off of tele and CCMD notified.   Arletta Bale, RN

## 2020-01-28 NOTE — TOC Transition Note (Signed)
Transition of Care [facility]) - CM/SW Discharge Note Marvetta Gibbons RN, BSN Transitions of Care Unit 4E- RN Case Manager [phone]   Patient Details  Name: Terrence Carr MRN: [id number] Date of Birth: 1961-11-01  Transition of Care [facility]) CM/SW Contact:  Dawayne Patricia, RN Phone Number: 01/28/2020, 12:37 PM   Clinical Narrative:    Pt admitted s/p R AKA, hx L AKA Nov. 2020, CIR consulted and have been notified by Lavera Guise with [facility] that bed available today for admission to CIR. Plan for pt to transition later today to [facility].    Final next level of care: IP Rehab Facility Barriers to Discharge: No Barriers Identified   Patient Goals and CMS Choice Patient states their goals for this hospitalization and ongoing recovery are:: rehab then return home CMS Medicare.gov Compare Post Acute Care list provided to:: Patient Choice offered to / list presented to : Patient  Discharge Placement        [facility]               Discharge Plan and Services   Discharge Planning Services: CM Consult                                 Social Determinants of Health (SDOH) Interventions     Readmission Risk Interventions No flowsheet data found.

## 2020-01-28 NOTE — Consult Note (Signed)
Acknowledging PV consult and reviewing patient's chart.  59 y/o male s/p L AKA November 2020. PMH of R fem-pop bypass 2020, HTN, tobacco and alcohol abuse. Admitted this stay with critical limb ischemia RLE, gangrene resulting in R AKA on 01/26/2020.  Per rehab notes current plan is for patient to admit to inpatient rehab pending approval to assist with functional limitations and increase independence and safety. Long term goals include discharge to home with assistance from family he lives with.   Patient currently in procedure room and not available to assess. I will check back at a later time to assess needs or barriers.  Thank you, [employer] RN BSN [place] [phone]

## 2020-01-28 NOTE — H&P (Signed)
untitled image        Physical Medicine and Rehabilitation Admission H&P            Chief Complaint    Patient presents with    .   [facility]/clotted fem/pop    :  HPI: Terrence Carr is a 59 year old right-handed male with history of hypertension, tobacco and alcohol use as well as vitamin B12 deficiency with peripheral vascular disease revascularization of right iliac artery stenting placement by Dr. 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 Donnetta Hutching as well as right common femoral to below-knee popliteal artery bypass 12/30/2019 receiving inpatient rehab services 12/30/2020 until  01/08/2020 and discharged to home maintained on aspirin.  Per chart review patient lives with spouse and family.  1 level home with ramped entrance.  Patient was using a wheelchair but was able to stand on the right leg and pivot.  Presented 01/25/2020 after being seen by vascular surgery and follow-up with noted extensive gangrenous changes of the right foot.Admission labs hemoglobin 5.6 and transfused 2 units PRBC.  Limb was not felt to be salvageable patient initially somewhat resistant to surgical intervention and underwent right AKA 01/26/2020 per Dr. Donzetta Matters.  Noted blood cultures did show 1/2 staph aureus.  Patient initially placed on vancomycin with follow-up per infectious disease.  Transthoracic echocardiogram with ejection fraction of 65%.  No left ventricular hypertrophy.  No regional wall motion abnormalities.  As well as follow-up repeat blood cultures pending.  Subcutaneous heparin for DVT prophylaxis.  Acute blood loss anemia 8.0.  Therapy evaluations completed with recommendations of physical medicine rehab consult.        Patient reports didn't sleep well overnight- trying to sleep during day today- LBM was before entered hospital, sometime last week.   Was putting a pillow under both AKAs, which puts him a risk for hip contractures  esp on L, since no edema.            Review of Systems   Constitutional: Negative for chills and fever.   HENT: Negative for hearing loss.    Eyes: Negative for blurred vision and double vision.   Respiratory: Negative for cough and shortness of breath.    Cardiovascular: Positive for leg swelling. Negative for chest pain and palpitations.   Gastrointestinal: Positive for constipation. Negative for heartburn, nausea and vomiting.   Genitourinary: Positive for urgency. Negative for dysuria, flank pain and hematuria.   Musculoskeletal: Positive for joint pain and myalgias.   Skin: Negative for rash.   Psychiatric/Behavioral: Positive for depression. The patient has insomnia.    All other systems reviewed and are negative.          Past Medical History:    Diagnosis   Date    .   Hypertension                 Past Surgical History:    Procedure   Laterality   Date    .   AMPUTATION   Left   12/24/2019        Procedure: LEFT ABOVE KNEE AMPUTATION;  Surgeon: Rosetta Posner, MD;  Location: [facility];  Service: Vascular;  Laterality: Left;    .   AMPUTATION   Right   01/26/2020        Procedure: AMPUTATION ABOVE KNEE;  Surgeon: Waynetta [place], MD;  Location: [facility];  Service: Vascular;  Laterality: Right;    .  FEMORAL-POPLITEAL BYPASS GRAFT   Right   12/30/2019        Procedure: RIGHT FEMORAL-POPLITEAL ARTERY BYPASS GRAFT;  Surgeon: Waynetta [place], MD;  Location: [facility];  Service: Vascular;  Laterality: Right;    .   LOWER EXTREMITY ANGIOGRAPHY   Right   12/28/2019        Procedure: LOWER EXTREMITY ANGIOGRAPHY;  Surgeon: Waynetta [place], MD;  Location: [facility];  Service: Cardiovascular;  Laterality: Right;       History reviewed. No pertinent family history.  Social History:  reports that he has never smoked. He has never used smokeless tobacco. He reports that he  does not drink alcohol or use drugs.  Allergies:         Allergies    Allergen   Reactions    .   Ibuprofen   Other (See Comments)            Patient said "the little brown pills" that he bought from the store made him talk out of his head (reported by [facility])    .   Other   Other (See Comments)            Unknown reaction to "Dihydroxyalaminum aminoacetate magnesium carbonate" (reported by [facility]              Medications Prior to Admission    Medication   Sig   Dispense   Refill    .   atorvastatin (LIPITOR) 20 MG tablet   Take 1 tablet (20 mg total) by mouth daily at 6 PM.   30 tablet   1    .   buPROPion (WELLBUTRIN SR) 100 MG 12 hr tablet   Take 1 tablet (100 mg total) by mouth 2 (two) times daily.   60 tablet   1    .   cyanocobalamin 1000 MCG tablet   Take 1 tablet (1,000 mcg total) by mouth daily.   30 tablet   1    .   folic acid (FOLVITE) 1 MG tablet   Take 1 tablet (1 mg total) by mouth daily.   30 tablet   0    .   gabapentin (NEURONTIN) 100 MG capsule   Take 1 capsule (100 mg total) by mouth 3 (three) times daily.   90 capsule   1    .   oxyCODONE (OXY IR/ROXICODONE) 5 MG immediate release tablet   Take 1-2 tablets (5-10 mg total) by mouth every 4 (four) hours as needed for moderate pain.   30 tablet   0    .   pantoprazole (PROTONIX) 40 MG tablet   Take 1 tablet (40 mg total) by mouth daily.   30 tablet   0    .   acetaminophen (TYLENOL) 325 MG tablet   Take 1-2 tablets (325-650 mg total) by mouth every 4 (four) hours as needed for mild pain (or temp >/= 101 F). (Patient not taking: Reported on 01/25/2020)            .   aspirin EC 81 MG EC tablet   Take 1 tablet (81 mg total) by mouth daily. (Patient not taking: Reported on 01/25/2020)            .   bisacodyl (DULCOLAX) 5 MG EC tablet   Take 1 tablet (5 mg total) by mouth  daily as needed for moderate constipation. (Patient not taking: Reported on 01/25/2020)  30 tablet   0    .   docusate sodium (COLACE) 100 MG capsule   Take 1 capsule (100 mg total) by mouth 2 (two) times daily. (Patient not taking: Reported on 01/25/2020)   10 capsule   0    .   polyethylene glycol (MIRALAX / GLYCOLAX) 17 g packet   Take 17 g by mouth daily as needed for mild constipation. (Patient not taking: Reported on 01/25/2020)   14 each   0          Drug Regimen Review  Drug regimen was reviewed and remains appropriate with no significant issues identified     Home:  Home Living  Family/patient expects to be discharged to:: Private residence  Living Arrangements: Spouse/significant other, Children, Other relatives  Available Help at Discharge: Family, Available 24 hours/day  Type of Home: House  Home Access: Ramped entrance  Home Layout: One level  Bathroom Shower/Tub: Tourist information centre manager: [place]: Wheelchair - manual, Bedside commode, Shower seat  Additional Comments: Pt reports home is w/c accessible     Functional History:  Prior Function  Level of Independence: Needs assistance  Gait / Transfers Assistance Needed: Pt using a wheelchair but was able to stand on R leg and pivot     Functional Status:   Mobility:  Bed Mobility  Overal bed mobility: Needs Assistance  Bed Mobility: Supine to Sit  Supine to sit: Min assist  Transfers  Overall transfer level: Needs assistance  Equipment used: None  Transfers: Therapist, art transfers: Min assist, +2 safety/equipment  General transfer comment: Pt able to scoot from bed to recliner with increased time and cues  Ambulation/Gait  General Gait Details: deferred- no prosthesis for L AKA and new R AKA       ADL:       Cognition:  Cognition  Overall Cognitive Status: Within Functional Limits for  tasks assessed  Orientation Level: Oriented X4  Cognition  Arousal/Alertness: Awake/alert  Behavior During Therapy: WFL for tasks assessed/performed  Overall Cognitive Status: Within Functional Limits for tasks assessed     Physical Exam:  Blood pressure (!) 147/66, pulse 77, temperature 98.2 F (36.8 C), temperature source Oral, resp. rate 13, height 5\' 6"  (1.676 m), weight 54 kg, SpO2 100 %.  Physical Exam   Nursing note and vitals reviewed.  Constitutional: He is oriented to person, place, and time.  Frail, thin appearing male with  Very few teeth notable in mouth, sitting up in bed, watching TV, NAD; pillows propping up B/L AKAs, putting him at risk for hip contractures.     HENT:   Head: Normocephalic and atraumatic.   Nose: Nose normal.   Mouth/Throat: No oropharyngeal exudate.   Eyes: Pupils are equal, round, and reactive to light. Conjunctivae and EOM are normal. Right eye exhibits no discharge. Left eye exhibits no discharge.   Neck: No tracheal deviation present.   Cardiovascular:  RRR   Respiratory:  CTA B/L- no W/R/R   GI:  slightly distended; NT, soft, hypoactive BS  Genitourinary:    Penis normal.     Genitourinary Comments: Urinal next to bed- medium amber urine in urinal.     Musculoskeletal:     Cervical back: Normal range of motion and neck supple.     Comments:  5-/5 in UEs- deltoids, biceps, triceps, WE, grip and finger abduction  HF at least 3+/5 in LEs- pain on R side R AKA wrapped in ACE  wrap L AKA healed with very little muscle/very thin  Neurological: He is oriented to person, place, and time. No cranial nerve deficit.  Patient is alert.  Complains of not sleeping well.  Makes good eye contact with examiner and follows commands.  He is oriented x3. Sensation intact in UEs B/L and B/L AKAs   Skin:  Right AKA is dressed appropriately tender.  Left AKA site well-healed with some dry skin.  2 IV's in L forearm- no signs of  infiltration.   Psychiatric:  More interactive than when last in rehab         Lab Results Last 48 Hours           Results for orders placed or performed during the hospital encounter of 01/25/20 (from the past 48 hour(s))    Respiratory Panel by RT PCR (Flu A&B, Covid) - Nasopharyngeal Swab     Status: None        Collection Time: 01/25/20  7:57 PM        Specimen: Nasopharyngeal Swab    Result   Value   Ref Range        SARS Coronavirus 2 by RT PCR   NEGATIVE   NEGATIVE            Comment:   (NOTE)  SARS-CoV-2 target nucleic acids are NOT DETECTED.  The SARS-CoV-2 RNA is generally detectable in upper respiratoy  specimens during the acute phase of infection. The lowest  concentration of SARS-CoV-2 viral copies this assay can detect is  131 copies/mL. A negative result does not preclude SARS-Cov-2  infection and should not be used as the sole basis for treatment or  other patient management decisions. A negative result may occur with   improper specimen collection/handling, submission of specimen other  than nasopharyngeal swab, presence of viral mutation(s) within the  areas targeted by this assay, and inadequate number of viral copies  (<131 copies/mL). A negative result must be combined with clinical  observations, patient history, and epidemiological information. The  expected result is Negative.  Fact Sheet for Patients:   PinkCheek.be  Fact Sheet for Healthcare Providers:   GravelBags.it  This test is not yet ap proved or cleared by the [place] FDA and   has been authorized for detection and/or diagnosis of SARS-CoV-2 by  FDA under an Emergency Use Authorization (EUA). This EUA will remain   in effect (meaning this test can be used) for the duration of the  COVID-19 declaration under Section 564(b)(1) of the Act, 21 U.S.C.  section 360bbb-3(b)(1),  unless the authorization is terminated or  revoked sooner.           Influenza A by PCR   NEGATIVE   NEGATIVE        Influenza B by PCR   NEGATIVE   NEGATIVE            Comment:   (NOTE)  The Xpert Xpress SARS-CoV-2/FLU/RSV assay is intended as an aid in   the diagnosis of influenza from Nasopharyngeal swab specimens and   should not be used as a sole basis for treatment. Nasal washings and   aspirates are unacceptable for Xpert Xpress SARS-CoV-2/FLU/RSV   testing.  Fact Sheet for Patients:  PinkCheek.be  Fact Sheet for Healthcare Providers:  GravelBags.it  This test is not yet approved or cleared by the [place] FDA and   has been authorized for detection and/or diagnosis of SARS-CoV-2 by   FDA under an Emergency  Use Authorization (EUA). This EUA will remain   in effect (meaning this test can be used) for the duration of the   Covid-19 declaration under Section 564(b)(1) of the Act, 21   U.S.C. section 360bbb-3(b)(1), unless the authorization is   terminated or revoked.  Performed at [facility], [place] [address] [place], [place]  [address]       Urinalysis, Routine w reflex microscopic     Status: Abnormal        Collection Time: 01/25/20  9:11 PM    Result   Value   Ref Range        Color, Urine   STRAW (A)   YELLOW        APPearance   CLEAR   CLEAR        Specific Gravity, Urine   1.029   1.005 - 1.030        pH   6.0   5.0 - 8.0        Glucose, UA   NEGATIVE   NEGATIVE mg/dL        Hgb urine dipstick   LARGE (A)   NEGATIVE        Bilirubin Urine   NEGATIVE   NEGATIVE        Ketones, ur   NEGATIVE   NEGATIVE mg/dL        Protein, ur   NEGATIVE   NEGATIVE mg/dL        Nitrite   NEGATIVE   NEGATIVE        Leukocytes,Ua   NEGATIVE   NEGATIVE        RBC / HPF    11-20   0 - 5 RBC/hpf        WBC, UA   0-5   0 - 5 WBC/hpf        Bacteria, UA   NONE SEEN   NONE SEEN            Comment:   Performed at [facility] [address] [place], [place] [address]    Comprehensive metabolic panel     Status: Abnormal        Collection Time: 01/25/20  9:15 PM    Result   Value   Ref Range        Sodium   135   135 - 145 mmol/L        Potassium   4.0   3.5 - 5.1 mmol/L        Chloride   102   98 - 111 mmol/L        CO2   22   22 - 32 mmol/L        Glucose, Bld   141 (H)   70 - 99 mg/dL        BUN   6   6 - 20 mg/dL        Creatinine, Ser   0.92   0.61 - 1.24 mg/dL        Calcium   8.9   8.9 - 10.3 mg/dL        Total Protein   7.6   6.5 - 8.1 g/dL        Albumin   1.9 (L)   3.5 - 5.0 g/dL        AST   13 (L)   15 - 41 U/L        ALT   12   0 -  44 U/L        Alkaline Phosphatase   124   38 - 126 U/L        Total Bilirubin   <0.1 (L)   0.3 - 1.2 mg/dL        GFR calc non Af Amer   >60   >60 mL/min        GFR calc Af Amer   >60   >60 mL/min        Anion gap   11   5 - 15            Comment:   Performed at [facility] [address] [place], [place] [address]    Lactic acid, plasma     Status: Abnormal        Collection Time: 01/25/20  9:15 PM    Result   Value   Ref Range        Lactic Acid, Venous   2.1 (HH)   0.5 - 1.9 mmol/L            Comment:   CRITICAL RESULT CALLED TO, READ BACK BY AND VERIFIED WITH:  RN K MUNNETT @2236  01/25/20 BY S GEZAHEGN  Performed at [facility], [place] [address], [place], [place] [address]       CBC with Differential     Status: Abnormal        Collection Time: 01/25/20  9:15 PM    Result   Value   Ref Range        WBC   12.4 (H)   4.0 - 10.5 K/uL        RBC   1.92  (L)   4.22 - 5.81 MIL/uL        Hemoglobin   5.6 (LL)   13.0 - 17.0 g/dL            Comment:   REPEATED TO VERIFY  THIS CRITICAL RESULT HAS VERIFIED AND BEEN CALLED TO W MUNETTE,RN BY DENNIS BRADLEY ON 01 25 2021 AT 2224, AND HAS BEEN READ BACK.            HCT   18.3 (L)   39.0 - 52.0 %        MCV   95.3   80.0 - 100.0 fL        MCH   29.2   26.0 - 34.0 pg        MCHC   30.6   30.0 - 36.0 g/dL        RDW   16.9 (H)   11.5 - 15.5 %        Platelets   1,170 (HH)   150 - 400 K/uL            Comment:   REPEATED TO VERIFY  PLATELET COUNT CONFIRMED BY SMEAR  SPECIMEN CHECKED FOR CLOTS  THIS CRITICAL RESULT HAS VERIFIED AND BEEN CALLED TO W MUNETTE,RN BY DENNIS BRADLEY ON 01 25 2021 AT 2224, AND HAS BEEN READ BACK.            nRBC   0.0   0.0 - 0.2 %        Neutrophils Relative %   76   %        Neutro Abs   9.5 (H)   1.7 - 7.7 K/uL        Lymphocytes Relative   12   %  Lymphs Abs   1.4   0.7 - 4.0 K/uL        Monocytes Relative   9   %        Monocytes Absolute   1.2 (H)   0.1 - 1.0 K/uL        Eosinophils Relative   1   %        Eosinophils Absolute   0.2   0.0 - 0.5 K/uL        Basophils Relative   1   %        Basophils Absolute   0.1   0.0 - 0.1 K/uL        Immature Granulocytes   1   %        Abs Immature Granulocytes   0.08 (H)   0.00 - 0.07 K/uL            Comment:   Performed at [facility] [address] [place], [place] [address]    Protime-INR     Status: None        Collection Time: 01/25/20  9:15 PM    Result   Value   Ref Range        Prothrombin Time   14.1   11.4 - 15.2 seconds        INR   1.1   0.8 - 1.2            Comment:   (NOTE)  INR goal varies based on device and disease states.  Performed at [facility], [place] [address] [place], [place]  [address]       Pathologist smear review     Status: None        Collection Time: 01/25/20  9:15 PM    Result   Value   Ref Range        Path Review   Reviewed By Violet Baldy, M.D.                Comment:   01/26/2020  Normocytic anemia, leukocytosis with hypersegmented neutrophils. Thrombocytosis.  Serum Iron, Vitamin B12, and Folate levels recommended.  Performed at [facility], [place] [address] [place],  [address]       Type and screen [place]     Status: None        Collection Time: 01/25/20  9:20 PM    Result   Value   Ref Range        ABO/RH(D)   O POS            Antibody Screen   NEG            Sample Expiration   01/28/2020,2359            Unit Number   [id number]            Blood Component Type   RED CELLS,LR            Unit division   00            Status of Unit   ISSUED,FINAL            Transfusion Status   OK TO TRANSFUSE            Crossmatch Result                    Compatible  Performed at [facility],  [address], [place], [place] [address]           Unit Number   [id number]            Blood Component Type   RED CELLS,LR            Unit division   00            Status of Unit   ISSUED,FINAL            Transfusion Status   OK TO TRANSFUSE            Crossmatch Result   Compatible        Culture, blood (routine x 2)     Status: Abnormal (Preliminary result)        Collection Time: 01/25/20  9:20 PM        Specimen: BLOOD    Result   Value   Ref Range        Specimen Description   BLOOD RIGHT ANTECUBITAL            Special Requests                    BOTTLES DRAWN AEROBIC AND ANAEROBIC Blood Culture adequate volume        Culture  Setup Time                     GRAM POSITIVE COCCI IN CLUSTERS  IN BOTH AEROBIC AND ANAEROBIC BOTTLES  CRITICAL RESULT CALLED TO, READ BACK BY AND VERIFIED WITH: KRIS HAYES @1148  01/26/20 [facility]           Culture   (A)                STAPHYLOCOCCUS AUREUS  SUSCEPTIBILITIES TO FOLLOW  Performed at [facility], [place] [address] [place], [place] [address]           Report Status   PENDING        Culture, blood (routine x 2)     Status: None (Preliminary result)        Collection Time: 01/25/20  9:22 PM        Specimen: BLOOD LEFT FOREARM    Result   Value   Ref Range        Specimen Description   BLOOD LEFT FOREARM            Special Requests                    BOTTLES DRAWN AEROBIC AND ANAEROBIC Blood Culture adequate volume        Culture                    NO GROWTH 2 DAYS  Performed at [facility], [place] [address], [place], [place] [address]           Report Status   PENDING        Prepare RBC     Status: None        Collection Time: 01/25/20 11:28 PM    Result   Value   Ref Range        Order Confirmation                    ORDER PROCESSED BY BLOOD BANK  Performed at [facility], [place] [address] [place], [place] [address]       Surgical  pcr screen     Status: Abnormal        Collection Time: 01/25/20 11:37 PM        Specimen: Nasal Mucosa; Nasal Swab    Result   Value   Ref Range        MRSA, PCR   POSITIVE (A)   NEGATIVE            Comment:   RESULT CALLED TO, READ BACK BY AND VERIFIED WITH:  T IRBY RN 01/26/20 0108 JDW           Staphylococcus aureus   POSITIVE (A)   NEGATIVE            Comment:   (NOTE)  The Xpert SA Assay (FDA approved for NASAL specimens in patients 41  years of age and older), is one component of a comprehensive  surveillance program. It is not intended to diagnose infection nor to  guide  or monitor treatment.  Performed at [facility], [place] [address], [place], [place]  [address]       Hemoglobin and hematocrit, blood     Status: Abnormal        Collection Time: 01/26/20  7:07 AM    Result   Value   Ref Range        Hemoglobin   8.6 (L)   13.0 - 17.0 g/dL            Comment:   REPEATED TO VERIFY  POST TRANSFUSION SPECIMEN           HCT   26.3 (L)   39.0 - 52.0 %            Comment:   Performed at [facility] [address], [place], [place] [address]    I-STAT, [place] 8     Status: Abnormal        Collection Time: 01/26/20 10:18 AM    Result   Value   Ref Range        Sodium   134 (L)   135 - 145 mmol/L        Potassium   4.1   3.5 - 5.1 mmol/L        Chloride   103   98 - 111 mmol/L        BUN   4 (L)   6 - 20 mg/dL        Creatinine, Ser   0.60 (L)   0.61 - 1.24 mg/dL        Glucose, Bld   100 (H)   70 - 99 mg/dL        Calcium, Ion   1.11 (L)   1.15 - 1.40 mmol/L        TCO2   23   22 - 32 mmol/L        Hemoglobin   8.2 (L)   13.0 - 17.0 g/dL        HCT   24.0 (L)   39.0 - 52.0 %    Glucose, capillary     Status: Abnormal        Collection Time: 01/26/20  9:39 PM    Result   Value   Ref Range        Glucose-Capillary   157 (H)   70 - 99 mg/dL    Basic metabolic panel     Status: Abnormal        Collection Time: 01/27/20  2:52  AM    Result   Value   Ref Range        Sodium   136   135 - 145 mmol/L        Potassium   4.2   3.5 - 5.1 mmol/L        Chloride   103   98 - 111 mmol/L        CO2   23   22 - 32 mmol/L        Glucose, Bld   141 (H)   70 - 99 mg/dL        BUN   9   6 - 20 mg/dL        Creatinine, Ser   0.79   0.61 - 1.24 mg/dL        Calcium   8.3 (L)   8.9 - 10.3 mg/dL        GFR calc non Af Amer    >60   >60 mL/min        GFR calc Af Amer   >60   >60 mL/min        Anion gap   10   5 - 15            Comment:   Performed at [facility] [address], [place], [place] [address]    CBC     Status: Abnormal        Collection Time: 01/27/20  2:52 AM    Result   Value   Ref Range        WBC   12.1 (H)   4.0 - 10.5 K/uL        RBC   2.69 (L)   4.22 - 5.81 MIL/uL        Hemoglobin   7.9 (L)   13.0 - 17.0 g/dL        HCT   25.0 (L)   39.0 - 52.0 %        MCV   92.9   80.0 - 100.0 fL        MCH   29.4   26.0 - 34.0 pg        MCHC   31.6   30.0 - 36.0 g/dL        RDW   15.9 (H)   11.5 - 15.5 %        Platelets   863 (H)   150 - 400 K/uL        nRBC   0.0   0.0 - 0.2 %            Comment:   Performed at [facility], [place] [address], [place], [place] [address]          Imaging Results (Last 48 hours)                   Medical Problem List and Plan:  1.  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  Antithrombotics:  -DVT/anticoagulation: Subcutaneous heparin              -  antiplatelet therapy: Aspirin 81 mg daily  3. Pain Management: Neurontin 100 mg 3 times daily, oxycodone as needed  4. Mood: Wellbutrin 100 mg twice daily              -antipsychotic agents: N/A  5. Neuropsych: This patient is capable of making decisions on his own behalf.  6. Skin/Wound Care: Routine skin checks  7. Fluids/Electrolytes/Nutrition: Routine in and outs with follow-up chemistries  8.  Staph aureus bacteremia.  Follow-up infectious disease.  Repeat  blood cultures pending.  Continue vancomycin and discussed duration with infectious disease; pharmacy dosing Vanc  9.  Acute blood loss anemia.  Follow-up CBC  10.  Vitamin B12 deficiency.  Continue supplement  11.  Hyperlipidemia.  Lipitor  12.  History of tobacco and alcohol use.  Provide counseling.           Lavon Paganini Angiulli, PA-C  01/27/2020      I have personally performed a face to face diagnostic evaluation of this patient and formulated the key components of the plan.  Additionally, I have personally reviewed laboratory data, imaging studies, as well as relevant notes and concur with the physician assistant's documentation above.     The patient's status has not changed from the original H&P.  Any changes in documentation from the acute care chart have been noted above.                     Revision History                                     Routing History

## 2020-01-28 NOTE — Progress Notes (Addendum)
  Progress Note    01/28/2020 7:50 AM 2 Days Post-Op  Subjective:  No new complaints today. Pain improved  Vitals:   01/27/20 2125 01/28/20 0600  BP: (!) 145/61 (!) 153/68  Pulse: (!) 28 62  Resp: 15 15  Temp: 98.4 F (36.9 C) 98.2 F (36.8 C)  SpO2: 94% 99%   Physical Exam: Lungs:  nonlabored Incisions: Right AKA stump clean, dry, intact. No fluid collections or drainage. Mild tenderness to palpation  Extremities:  3+ Right femoral pulse  CBC    Component Value Date/Time   WBC 9.1 01/28/2020 0342   RBC 2.68 (L) 01/28/2020 0342   HGB 8.0 (L) 01/28/2020 0342   HCT 25.4 (L) 01/28/2020 0342   HCT 24.7 (L) 12/24/2019 0419   PLT 868 (H) 01/28/2020 0342   MCV 94.8 01/28/2020 0342   MCH 29.9 01/28/2020 0342   MCHC 31.5 01/28/2020 0342   RDW 16.1 (H) 01/28/2020 0342   LYMPHSABS 1.4 01/25/2020 2115   MONOABS 1.2 (H) 01/25/2020 2115   EOSABS 0.2 01/25/2020 2115   BASOSABS 0.1 01/25/2020 2115    BMET    Component Value Date/Time   NA 138 01/28/2020 0342   K 4.0 01/28/2020 0342   CL 101 01/28/2020 0342   CO2 23 01/28/2020 0342   GLUCOSE 111 (H) 01/28/2020 0342   BUN 7 01/28/2020 0342   CREATININE 0.88 01/28/2020 0342   CALCIUM 8.5 (L) 01/28/2020 0342   GFRNONAA >60 01/28/2020 0342   GFRAA >60 01/28/2020 0342    INR    Component Value Date/Time   INR 1.1 01/25/2020 2115     Intake/Output Summary (Last 24 hours) at 01/28/2020 0750 Last data filed at 01/28/2020 0555 Gross per 24 hour  Intake 790 ml  Output 2250 ml  Net -1460 ml     Assessment/Plan:  59 y.o. male is s/p Right AKA 2 Days Post-Op  -Pain improved. Right AKA stump clean dry and intact. Pt previously was at CIR following left AKA. Plan is for him to return to CIR pending bed availability  DVT prophylaxis:  Subcutaneous Heparin   Karoline Caldwell, PA-C [facility] [phone] 01/28/2020 7:50 AM   I have seen and evaluated the patient. I agree with the PA note as documented  above. Right AKA stump looks good, dressing removed this am.  OK for CIR.  Staples will stay 3 weeks.  Will arrange follow-up.  Marty Heck, MD [facility] [place] Office: [phone]

## 2020-01-28 NOTE — Progress Notes (Signed)
Pharmacy Antibiotic Note  Terrence Carr is a 59 y.o. male admitted on 01/28/2020 with right foot/leg gangrene. S/P AKA of right leg yesterday. Now with staph aureus in 1/2 blood cultures.  Pharmacy has been consulted for vancomycin dosing.  Noted that patient has lower body mass due to bilateral AKAs.  -WBC= 9.1, afebrile, SCr= 0.88  Plan: -Continue Vancomycin 500 mg every 12 hours (Predicted AUC 474 with SCr 1)  -Monitor renal function, blood culture susceptibilities, levels as appropriate     Temp (24hrs), Avg:98.4 F (36.9 C), Min:98.2 F (36.8 C), Max:98.5 F (36.9 C)  Recent Labs  Lab 01/25/20 2115 01/26/20 1018 01/27/20 0252 01/28/20 0342  WBC 12.4*  --  12.1* 9.1  CREATININE 0.92 0.60* 0.79 0.88  LATICACIDVEN 2.1*  --   --   --     Estimated Creatinine Clearance: 68.2 mL/min (by C-G formula based on SCr of 0.88 mg/dL).    Allergies  Allergen Reactions  . Ibuprofen Other (See Comments)    Patient said "the little brown pills" that he bought from the store made him talk out of his head (reported by [facility])  . Other Other (See Comments)    Unknown reaction to "Dihydroxyalaminum aminoacetate magnesium carbonate" (reported by [facility]      Thank you for allowing pharmacy to be a part of this patient's care.  Hildred Laser, PharmD Clinical Pharmacist **Pharmacist phone directory can now be found on [email or web address] ([email or web address]).  Listed under [place].

## 2020-01-28 NOTE — Discharge Summary (Signed)
Discharge Summary  Patient ID: Terrence Carr [id number] 59 y.o. 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 Right above knee amputation  Admission Condition: poor  Discharged Condition: good  Indication for Admission: Critical limb ischemia with occluded right lower extremity bypass with gangrenous changes of the right lower extremity  Hospital Course: 59 year old male with history of left above-knee amputation. He underwent a femoral to below-knee popliteal artery bypass with vein and hopes of salvage of his gangrenous changes of his right foot.  Bypass is now occluded he has gangrenous changes of the right leg and is indicated for above-knee amputation. He has chronic anemia with HGB of 5.6 on admission so he was transfused with 2 units PRBCs pre operatively. He subsequently underwent a right AKA. His hospital stay was uneventful. He will be discharged today to CIR and then will return home. He will follow up in the office in 3 weeks for staple removal   Consults: None  Treatments: surgery: right above knee amputation. IV Vancomycin. 2 units PRBC  Patient Instructions:  Allergies as of 01/28/2020      Reactions   Ibuprofen Other (See Comments)   Patient said "the little brown pills" that he bought from the store made him talk out of his head (reported by [facility])   Other Other (See Comments)   Unknown reaction to "Dihydroxyalaminum aminoacetate magnesium carbonate" (reported by [facility]      Medication List    TAKE these medications   acetaminophen 325 MG tablet Commonly known as: TYLENOL Take 1-2 tablets  (325-650 mg total) by mouth every 4 (four) hours as needed for mild pain (or temp >/= 101 F).   aspirin 81 MG EC tablet Take 1 tablet (81 mg total) by mouth daily.   atorvastatin 20 MG tablet Commonly known as: LIPITOR Take 1 tablet (20 mg total) by mouth daily at 6 PM.   bisacodyl 5 MG EC tablet Commonly known as: DULCOLAX Take 1 tablet (5 mg total) by mouth daily as needed for moderate constipation.   buPROPion 100 MG 12 hr tablet Commonly known as: WELLBUTRIN SR Take 1 tablet (100 mg total) by mouth 2 (two) times daily.   cyanocobalamin 1000 MCG tablet Take 1 tablet (1,000 mcg total) by mouth daily.   docusate sodium 100 MG capsule Commonly known as: COLACE Take 1 capsule (100 mg total) by mouth 2 (two) times daily.   folic acid 1 MG tablet Commonly known as: FOLVITE Take 1 tablet (1 mg total) by mouth daily.   gabapentin 100 MG capsule Commonly known as: NEURONTIN Take 1 capsule (100 mg total) by mouth 3 (three) times daily.   oxyCODONE 5 MG immediate release tablet Commonly known as: Oxy IR/ROXICODONE Take 1-2 tablets (5-10 mg total) by mouth every 4 (four) hours as needed for moderate pain.   pantoprazole 40 MG tablet Commonly known as: PROTONIX Take 1 tablet (40 mg total) by mouth daily.   polyethylene glycol 17 g packet Commonly known as: MIRALAX / GLYCOLAX Take 17 g by mouth daily as needed for mild constipation.      Activity: activity as tolerated Diet: regular diet Wound Care: May shower daily. keep Right BKA stump clean and dry  Follow-up with [facility] office in 3-4 weeks for staple removal   Signed: Karoline Caldwell, PA-C 01/28/2020 10:49 AM [facility] Office: [phone]

## 2020-01-28 NOTE — Progress Notes (Signed)
Inpatient Rehabilitation Admissions Coordinator  I have CIR bed to admit pt to today. I will contact Dr. Ainsley Spinner service to make the arrangements for d/c. I have updated RN CM , Kristi and SW, Ord. I will make the arrangements to admit today.  Danne Baxter, RN, MSN Rehab Admissions Coordinator [phone] 01/28/2020 9:28 AM

## 2020-01-29 ENCOUNTER — Inpatient Hospital Stay (HOSPITAL_COMMUNITY): Payer: Medicaid Other | Admitting: Physical Therapy

## 2020-01-29 ENCOUNTER — Inpatient Hospital Stay (HOSPITAL_COMMUNITY): Payer: Medicaid Other | Admitting: Occupational Therapy

## 2020-01-29 ENCOUNTER — Inpatient Hospital Stay (HOSPITAL_COMMUNITY): Payer: Medicaid Other

## 2020-01-29 DIAGNOSIS — B9562 Methicillin resistant Staphylococcus aureus infection as the cause of diseases classified elsewhere: Secondary | ICD-10-CM

## 2020-01-29 DIAGNOSIS — Z89612 Acquired absence of left leg above knee: Secondary | ICD-10-CM

## 2020-01-29 DIAGNOSIS — I96 Gangrene, not elsewhere classified: Secondary | ICD-10-CM

## 2020-01-29 DIAGNOSIS — R7881 Bacteremia: Secondary | ICD-10-CM

## 2020-01-29 DIAGNOSIS — Z89512 Acquired absence of left leg below knee: Secondary | ICD-10-CM

## 2020-01-29 DIAGNOSIS — Z89611 Acquired absence of right leg above knee: Secondary | ICD-10-CM

## 2020-01-29 LAB — CBC WITH DIFFERENTIAL/PLATELET
Abs Immature Granulocytes: 0.04 10*3/uL (ref 0.00–0.07)
Basophils Absolute: 0.1 10*3/uL (ref 0.0–0.1)
Basophils Relative: 1 %
Eosinophils Absolute: 0.2 10*3/uL (ref 0.0–0.5)
Eosinophils Relative: 3 %
HCT: 27.8 % — ABNORMAL LOW (ref 39.0–52.0)
Hemoglobin: 8.7 g/dL — ABNORMAL LOW (ref 13.0–17.0)
Immature Granulocytes: 1 %
Lymphocytes Relative: 22 %
Lymphs Abs: 1.4 10*3/uL (ref 0.7–4.0)
MCH: 29.6 pg (ref 26.0–34.0)
MCHC: 31.3 g/dL (ref 30.0–36.0)
MCV: 94.6 fL (ref 80.0–100.0)
Monocytes Absolute: 1 10*3/uL (ref 0.1–1.0)
Monocytes Relative: 16 %
Neutro Abs: 3.8 10*3/uL (ref 1.7–7.7)
Neutrophils Relative %: 57 %
Platelets: 861 10*3/uL — ABNORMAL HIGH (ref 150–400)
RBC: 2.94 MIL/uL — ABNORMAL LOW (ref 4.22–5.81)
RDW: 16 % — ABNORMAL HIGH (ref 11.5–15.5)
WBC: 6.5 10*3/uL (ref 4.0–10.5)
nRBC: 0 % (ref 0.0–0.2)

## 2020-01-29 LAB — CULTURE, BLOOD (ROUTINE X 2): Special Requests: ADEQUATE

## 2020-01-29 LAB — COMPREHENSIVE METABOLIC PANEL
ALT: 20 U/L (ref 0–44)
AST: 32 U/L (ref 15–41)
Albumin: 1.6 g/dL — ABNORMAL LOW (ref 3.5–5.0)
Alkaline Phosphatase: 104 U/L (ref 38–126)
Anion gap: 10 (ref 5–15)
BUN: 10 mg/dL (ref 6–20)
CO2: 22 mmol/L (ref 22–32)
Calcium: 8.6 mg/dL — ABNORMAL LOW (ref 8.9–10.3)
Chloride: 100 mmol/L (ref 98–111)
Creatinine, Ser: 0.72 mg/dL (ref 0.61–1.24)
GFR calc Af Amer: >60 mL/min (ref >60)
GFR calc non Af Amer: >60 mL/min (ref >60)
Glucose, Bld: 106 mg/dL — ABNORMAL HIGH (ref 70–99)
Potassium: 4 mmol/L (ref 3.5–5.1)
Sodium: 132 mmol/L — ABNORMAL LOW (ref 135–145)
Total Bilirubin: 0.6 mg/dL (ref 0.3–1.2)
Total Protein: 6.1 g/dL — ABNORMAL LOW (ref 6.5–8.1)

## 2020-01-29 NOTE — Progress Notes (Signed)
Terrence Carr for Infectious Disease  Date of Admission:  01/28/2020      Total days of antibiotics 5  Day 3 Vancomycin            ASSESSMENT: Terrence Carr is a 59 y.o. 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 He is now s/p AKA 01/26/20. TTE without any valvular vegetations, EF normal 65%, no structural valve problems identified. Repeat blood cultures following amputation no growth 1/27.   Will need 10 more days of IV Vancomycin given he was bacteremic with virulent organism. OK to place Midline if OK with IV team vs PICC at any time.   There is also a beta-hemolytic organism awaiting ID on culture - likely strep which is covered under vancomycin.     PLAN: 1. Continue Vancomycin IV through CIR stay or 10 days max.  2. OK to place midline if OK with IV team vs PICC    Will sign off - available for questions if needed.    Active Problems:   S/P bilateral above knee amputation ([place])   . aspirin EC  81 mg Oral Daily  . atorvastatin  20 mg Oral q1800  . buPROPion  100 mg Oral BID  . docusate sodium  100 mg Oral Daily  . folic acid  1 mg Oral Daily  . gabapentin  100 mg Oral TID  . heparin  5,000 Units Subcutaneous Q8H  . pantoprazole  40 mg Oral Daily  . cyanocobalamin  1,000 mcg Oral Daily    OBJECTIVE: Vitals:   01/28/20 1549 01/28/20 1924 01/29/20 0424  BP: 139/73 139/69 (!) 148/65  Pulse: 69 75 62  Resp: 18 18 19   Temp: 98.3 F (36.8 C) 98.3 F (36.8 C) 98.3 F (36.8 C)  SpO2: 100% 100% 100%   There is no height or weight on file to calculate BMI.   Lab Results Lab Results  Component Value Date   WBC 6.5 01/29/2020   HGB 8.7 (L) 01/29/2020   HCT 27.8 (L) 01/29/2020   MCV 94.6 01/29/2020   PLT 861 (H) 01/29/2020    Lab Results  Component Value Date   CREATININE 0.72 01/29/2020   BUN 10 01/29/2020   NA 132 (L) 01/29/2020   K 4.0 01/29/2020   CL 100 01/29/2020   CO2 22  01/29/2020    Lab Results  Component Value Date   ALT 20 01/29/2020   AST 32 01/29/2020   ALKPHOS 104 01/29/2020   BILITOT 0.6 01/29/2020     Microbiology: Recent Results (from the past 240 hour(s))  Respiratory Panel by RT PCR (Flu A&B, Covid) - Nasopharyngeal Swab     Status: None   Collection Time: 01/25/20  7:57 PM   Specimen: Nasopharyngeal Swab  Result Value Ref Range Status   SARS Coronavirus 2 by RT PCR NEGATIVE NEGATIVE Final    Comment: (NOTE) SARS-CoV-2 target nucleic acids are NOT DETECTED. The SARS-CoV-2 RNA is generally detectable in upper respiratoy specimens during the acute phase of infection. The lowest concentration of SARS-CoV-2 viral copies this assay can detect is 131 copies/mL. A negative result does not preclude SARS-Cov-2 infection and should not be used as the sole basis for treatment or other patient management decisions. A negative result may occur with  improper specimen collection/handling, submission of specimen other than nasopharyngeal swab, presence of viral mutation(s) within the areas targeted by this assay, and inadequate number of viral copies (<  131 copies/mL). A negative result must be combined with clinical observations, patient history, and epidemiological information. The expected result is Negative. Fact Sheet for Patients:  PinkCheek.be Fact Sheet for Healthcare Providers:  GravelBags.it This test is not yet ap proved or cleared by the [place] FDA and  has been authorized for detection and/or diagnosis of SARS-CoV-2 by FDA under an Emergency Use Authorization (EUA). This EUA will remain  in effect (meaning this test can be used) for the duration of the COVID-19 declaration under Section 564(b)(1) of the Act, 21 U.S.C. section 360bbb-3(b)(1), unless the authorization is terminated or revoked sooner.    Influenza A by PCR NEGATIVE NEGATIVE Final   Influenza B by PCR  NEGATIVE NEGATIVE Final    Comment: (NOTE) The Xpert Xpress SARS-CoV-2/FLU/RSV assay is intended as an aid in  the diagnosis of influenza from Nasopharyngeal swab specimens and  should not be used as a sole basis for treatment. Nasal washings and  aspirates are unacceptable for Xpert Xpress SARS-CoV-2/FLU/RSV  testing. Fact Sheet for Patients: PinkCheek.be Fact Sheet for Healthcare Providers: GravelBags.it This test is not yet approved or cleared by the [place] FDA and  has been authorized for detection and/or diagnosis of SARS-CoV-2 by  FDA under an Emergency Use Authorization (EUA). This EUA will remain  in effect (meaning this test can be used) for the duration of the  Covid-19 declaration under Section 564(b)(1) of the Act, 21  U.S.C. section 360bbb-3(b)(1), unless the authorization is  terminated or revoked. Performed at [facility], [place] [address], [place], [place] [address]   Culture, blood (routine x 2)     Status: Abnormal (Preliminary result)   Collection Time: 01/25/20  9:20 PM   Specimen: BLOOD  Result Value Ref Range Status   Specimen Description BLOOD RIGHT ANTECUBITAL  Final   Special Requests   Final    BOTTLES DRAWN AEROBIC AND ANAEROBIC Blood Culture adequate volume   Culture  Setup Time   Final    GRAM POSITIVE COCCI IN CLUSTERS IN BOTH AEROBIC AND ANAEROBIC BOTTLES CRITICAL RESULT CALLED TO, READ BACK BY AND VERIFIED WITH: KRIS HAYES @1148  01/26/20 AKT    Culture (A)  Final    METHICILLIN RESISTANT STAPHYLOCOCCUS AUREUS BETA HEMOLYTIC ORGANISM BEING IDENTIFIED CULTURE REINCUBATED FOR BETTER GROWTH Performed at [facility], [place] [address] [place], [place] [address]    Report Status PENDING  Incomplete   Organism ID, Bacteria METHICILLIN RESISTANT STAPHYLOCOCCUS AUREUS  Final      Susceptibility   Methicillin resistant staphylococcus aureus - MIC*    CIPROFLOXACIN >=8 RESISTANT  Resistant     ERYTHROMYCIN >=8 RESISTANT Resistant     GENTAMICIN <=0.5 SENSITIVE Sensitive     OXACILLIN >=4 RESISTANT Resistant     TETRACYCLINE <=1 SENSITIVE Sensitive     VANCOMYCIN 1 SENSITIVE Sensitive     TRIMETH/SULFA <=10 SENSITIVE Sensitive     CLINDAMYCIN <=0.25 SENSITIVE Sensitive     RIFAMPIN <=0.5 SENSITIVE Sensitive     Inducible Clindamycin NEGATIVE Sensitive     * METHICILLIN RESISTANT STAPHYLOCOCCUS AUREUS  Culture, blood (routine x 2)     Status: None (Preliminary result)   Collection Time: 01/25/20  9:22 PM   Specimen: BLOOD LEFT FOREARM  Result Value Ref Range Status   Specimen Description BLOOD LEFT FOREARM  Final   Special Requests   Final    BOTTLES DRAWN AEROBIC AND ANAEROBIC Blood Culture adequate volume   Culture   Final  NO GROWTH 4 DAYS Performed at [facility], [place] [address] [place], [place] [address]    Report Status PENDING  Incomplete  Surgical pcr screen     Status: Abnormal   Collection Time: 01/25/20 11:37 PM   Specimen: Nasal Mucosa; Nasal Swab  Result Value Ref Range Status   MRSA, PCR POSITIVE (A) NEGATIVE Final    Comment: RESULT CALLED TO, READ BACK BY AND VERIFIED WITH: T IRBY RN 01/26/20 0108 JDW    Staphylococcus aureus POSITIVE (A) NEGATIVE Final    Comment: (NOTE) The Xpert SA Assay (FDA approved for NASAL specimens in patients 56 years of age and older), is one component of a comprehensive surveillance program. It is not intended to diagnose infection nor to guide or monitor treatment. Performed at [facility], [place] [address], [place], [place] [address]   Culture, blood (routine x 2)     Status: None (Preliminary result)   Collection Time: 01/27/20 10:54 AM   Specimen: BLOOD  Result Value Ref Range Status   Specimen Description BLOOD RIGHT ANTECUBITAL  Final   Special Requests   Final    BOTTLES DRAWN AEROBIC AND ANAEROBIC Blood Culture adequate volume   Culture   Final    NO GROWTH 2 DAYS Performed at  [facility], [place] [address], [place], [place] [address]    Report Status PENDING  Incomplete  Culture, blood (routine x 2)     Status: None (Preliminary result)   Collection Time: 01/27/20 10:56 AM   Specimen: BLOOD RIGHT ARM  Result Value Ref Range Status   Specimen Description BLOOD RIGHT ARM  Final   Special Requests   Final    BOTTLES DRAWN AEROBIC AND ANAEROBIC Blood Culture adequate volume   Culture   Final    NO GROWTH 2 DAYS Performed at [facility], [place] [address] [place], [place] [address]    Report Status PENDING  Incomplete    Janene Madeira, MSN, NP-C [facility] [email or web address] Pager: [phone] Office: [place]: [phone]

## 2020-01-29 NOTE — Progress Notes (Signed)
[place] PHYSICAL MEDICINE & REHABILITATION PROGRESS NOTE   Subjective/Complaints:  Pt reports he's "sore" worse than expected today so doesn't plan on doing therapy today- at the moment he reported that, he was doing initial evaluation with OT for therapy.   ID said 10 more days of IV Vanc, so to place a midline.   ROS: pt denies SOB, CP, abd pain, N/V/C/D, vision changes or headache   Objective:   ECHOCARDIOGRAM COMPLETE  Result Date: 01/27/2020   ECHOCARDIOGRAM REPORT   Patient Name:   Terrence Carr Date of Exam: 01/27/2020 Medical Rec #:  [id number]        Height:       66.0 in Accession #:    [id number]       Weight:       119.0 lb Date of Birth:  1961/06/23        BSA:          1.60 m Patient Age:    59 years         BP:           143/62 mmHg Patient Gender: M                HR:           66 bpm. Exam Location:  Inpatient Procedure: 2D Echo Indications:    Bacteremia  History:        Patient has no prior history of Echocardiogram examinations.                 Risk Factors:Current Smoker and Hypertension. ETOH abuse.  Sonographer:    Clayton Lefort RDCS (AE) Referring Phys: [place]  1. Left ventricular ejection fraction, by visual estimation, is 60 to 65%. The left ventricle has normal function. There is no left ventricular hypertrophy.  2. The left ventricle has no regional wall motion abnormalities.  3. Global right ventricle has normal systolic function.The right ventricular size is normal. No increase in right ventricular wall thickness.  4. Left atrial size was mild-moderately dilated.  5. Right atrial size was moderately dilated.  6. Trivial pericardial effusion is present.  7. The mitral valve is normal in structure. Trivial mitral valve regurgitation. No evidence of mitral stenosis.  8. Calcified chordae seen, does not move independently, not suggestive of vegetation.  9. The tricuspid valve is normal in structure. 10. The tricuspid valve is normal in  structure. Tricuspid valve regurgitation is not demonstrated. 11. The aortic valve is tricuspid. Aortic valve regurgitation is not visualized. No evidence of aortic valve sclerosis or stenosis. 12. The pulmonic valve was normal in structure. Pulmonic valve regurgitation is not visualized. 13. The inferior vena cava is normal in size with <50% respiratory variability, suggesting right atrial pressure of 8 mmHg. 14. No evidence of valvular vegetations on this transthoracic echocardiogram. Would recommend a transesophageal echocardiogram to exclude infective endocarditis if clinically indicated. FINDINGS  Left Ventricle: Left ventricular ejection fraction, by visual estimation, is 60 to 65%. The left ventricle has normal function. The left ventricle has no regional wall motion abnormalities. There is no left ventricular hypertrophy. Left ventricular diastolic parameters were normal. Normal left atrial pressure. Right Ventricle: The right ventricular size is normal. No increase in right ventricular wall thickness. Global RV systolic function is has normal systolic function. Left Atrium: Left atrial size was mild-moderately dilated. Right Atrium: Right atrial size was moderately dilated Pericardium: Trivial pericardial effusion is present. Mitral Valve: The mitral valve  is normal in structure. Trivial mitral valve regurgitation. No evidence of mitral valve stenosis by observation. MV peak gradient, 4.0 mmHg. Calcified chordae seen, does not move independently, not suggestive of vegetation. Tricuspid Valve: The tricuspid valve is normal in structure. Tricuspid valve regurgitation is not demonstrated. Aortic Valve: The aortic valve is tricuspid. . There is mild thickening of the aortic valve. Aortic valve regurgitation is not visualized. The aortic valve is structurally normal, with no evidence of sclerosis or stenosis. There is mild thickening of the  aortic valve. Aortic valve mean gradient measures 3.0 mmHg. Aortic  valve peak gradient measures 5.4 mmHg. Aortic valve area, by VTI measures 2.40 cm. Pulmonic Valve: The pulmonic valve was normal in structure. Pulmonic valve regurgitation is not visualized. Pulmonic regurgitation is not visualized. Aorta: The aortic root, ascending aorta and aortic arch are all structurally normal, with no evidence of dilitation or obstruction. Venous: The inferior vena cava is normal in size with less than 50% respiratory variability, suggesting right atrial pressure of 8 mmHg. IAS/Shunts: No atrial level shunt detected by color flow Doppler. There is no evidence of a patent foramen ovale. No ventricular septal defect is seen or detected. There is no evidence of an atrial septal defect. Additional Comments: No evidence of valvular vegetations on this transthoracic echocardiogram. Would recommend a transesophageal echocardiogram to exclude infective endocarditis if clinically indicated.  LEFT VENTRICLE PLAX 2D LVIDd:         5.10 cm  Diastology LVIDs:         3.40 cm  LV e' lateral:   12.50 cm/s LV PW:         1.10 cm  LV E/e' lateral: 5.5 LV IVS:        1.00 cm  LV e' medial:    12.40 cm/s LVOT diam:     2.00 cm  LV E/e' medial:  5.5 LV SV:         76 ml LV SV Index:   48.37 LVOT Area:     3.14 cm  RIGHT VENTRICLE             IVC RV Basal diam:  3.30 cm     IVC diam: 2.00 cm RV S prime:     15.60 cm/s TAPSE (M-mode): 2.8 cm LEFT ATRIUM             Index       RIGHT ATRIUM           Index LA diam:        3.20 cm 1.99 cm/m  RA Area:     21.80 cm LA Vol (A2C):   45.1 ml 28.12 ml/m RA Volume:   65.50 ml  40.83 ml/m LA Vol (A4C):   71.3 ml 44.45 ml/m LA Biplane Vol: 61.5 ml 38.34 ml/m  AORTIC VALVE AV Area (Vmax):    2.23 cm AV Area (Vmean):   2.33 cm AV Area (VTI):     2.40 cm AV Vmax:           116.00 cm/s AV Vmean:          75.000 cm/s AV VTI:            0.242 m AV Peak Grad:      5.4 mmHg AV Mean Grad:      3.0 mmHg LVOT Vmax:         82.20 cm/s LVOT Vmean:        55.700 cm/s LVOT VTI:  0.185 m LVOT/AV VTI ratio: 0.76  AORTA Ao Root diam: 3.20 cm MITRAL VALVE MV Area (PHT): 2.50 cm             SHUNTS MV Peak grad:  4.0 mmHg             Systemic VTI:  0.18 m MV Mean grad:  1.0 mmHg             Systemic Diam: 2.00 cm MV Vmax:       1.00 m/s MV Vmean:      54.8 cm/s MV VTI:        0.31 m MV PHT:        87.87 msec MV Decel Time: 303 msec MV E velocity: 68.60 cm/s 103 cm/s MV A velocity: 47.10 cm/s 70.3 cm/s MV E/A ratio:  1.46       1.5  Buford Dresser MD Electronically signed by Buford Dresser MD Signature Date/Time: 01/27/2020/6:47:10 PM    Final    Recent Labs    01/28/20 0342 01/29/20 0545  WBC 9.1 6.5  HGB 8.0* 8.7*  HCT 25.4* 27.8*  PLT 868* 861*   Recent Labs    01/28/20 0342 01/29/20 0545  NA 138 132*  K 4.0 4.0  CL 101 100  CO2 23 22  GLUCOSE 111* 106*  BUN 7 10  CREATININE 0.88 0.72  CALCIUM 8.5* 8.6*    Intake/Output Summary (Last 24 hours) at 01/29/2020 1621 Last data filed at 01/29/2020 1615 Gross per 24 hour  Intake 460 ml  Output 1500 ml  Net -1040 ml     Physical Exam: Vital Signs Blood pressure (!) 124/58, pulse 69, temperature 98.3 F (36.8 C), resp. rate 19, SpO2 100 %.   Constitutional: He is oriented to person, place, and time.  Frail, thin appearing male with  Very few teeth notable in mouth, sitting up in bed, watching TV, getting initial eval by OT, NAD HENT: Normocephalic and atraumatic Mouth/Throat: No oropharyngeal exudate.  Eyes:  Conjunctivae and EOM are normal.  Neck: No tracheal deviation present.  Cardiovascular: RRR  Respiratory:  CTA B/L- no W/R/R  GI: slightly distended vs protuberant; NT, soft, hypoactive BS  Genitourinary:    Penis normal.  Musculoskeletal:     Cervical back: Normal range of motion     Comments:  5-/5 in UEs- deltoids, biceps, triceps, WE, grip and finger abduction HF at least 3+/5 in LEs- pain on R side R AKA wrapped in ACE wrap L AKA healed with very little muscle/very  thin  Neurological: He is oriented to person, place, and time.  Sensation intact in UEs B/L and B/L AKAs  Skin:  Right AKA is dressed appropriately tender.  Left AKA site well-healed with some dry skin.  2 IV's in L forearm- no signs of infiltration.   Psychiatric:  Grumpy this AM       Assessment/Plan: 1. Functional deficits secondary new R AKA and recent L AKA as well as bacteremia on IV Vanc to which require 3+ hours per day of interdisciplinary therapy in a comprehensive inpatient rehab setting.  Physiatrist is providing close team supervision and 24 hour management of active medical problems listed below.  Physiatrist and rehab team continue to assess barriers to discharge/monitor patient progress toward functional and medical goals  Care Tool:  Bathing    Body parts bathed by patient: Right arm, Left arm, Chest, Abdomen, Face, Front perineal area, Buttocks, Left upper leg     Body parts n/a:  Right upper leg, Right lower leg, Left lower leg   Bathing assist Assist Level: Supervision/Verbal cueing     Upper Body Dressing/Undressing Upper body dressing   What is the patient wearing?: Pull over shirt    Upper body assist Assist Level: Supervision/Verbal cueing    Lower Body Dressing/Undressing Lower body dressing      What is the patient wearing?: Incontinence brief, Pants     Lower body assist Assist for lower body dressing: Total Assistance - Patient < 25%     Toileting Toileting Toileting Activity did not occur Landscape architect and hygiene only): N/A (no void or bm)  Toileting assist Assist for toileting: Minimal Assistance - Patient > 75% Assistive Device Comment: urnial   Transfers Chair/bed transfer  Transfers assist  Chair/bed transfer activity did not occur: N/A  Chair/bed transfer assist level: Moderate Assistance - Patient 50 - 74%     Locomotion Ambulation   Ambulation assist   Ambulation activity did not occur: N/A           Walk 10 feet activity   Assist  Walk 10 feet activity did not occur: N/A        Walk 50 feet activity   Assist Walk 50 feet with 2 turns activity did not occur: N/A         Walk 150 feet activity   Assist Walk 150 feet activity did not occur: N/A         Walk 10 feet on uneven surface  activity   Assist Walk 10 feet on uneven surfaces activity did not occur: N/A         Wheelchair     Assist Will patient use wheelchair at discharge?: Yes Type of Wheelchair: Manual    Wheelchair assist level: Supervision/Verbal cueing Max wheelchair distance: 150'    Wheelchair 50 feet with 2 turns activity    Assist        Assist Level: Supervision/Verbal cueing   Wheelchair 150 feet activity     Assist      Assist Level: Supervision/Verbal cueing   Blood pressure (!) 124/58, pulse 69, temperature 98.3 F (36.8 C), resp. rate 19, SpO2 100 %.  Medical Problem List and Plan: 1.  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  Antithrombotics: -DVT/anticoagulation: Subcutaneous heparin             -antiplatelet therapy: Aspirin 81 mg daily 3. Pain Management: Neurontin 100 mg 3 times daily, oxycodone as needed 4. Mood: Wellbutrin 100 mg twice daily             -antipsychotic agents: N/A 5. Neuropsych: This patient is capable of making decisions on his own behalf. 6. Skin/Wound Care: Routine skin checks for new R AKA incision 7. Fluids/Electrolytes/Nutrition: Routine in and outs with follow-up chemistries 8.  Staph aureus bacteremia.  Follow-up infectious disease.  Repeat blood cultures pending.  Continue vancomycin and discussed duration with infectious disease;  pharmacy dosing Vanc  1/29- 10 more days of IV Vanc per ID- will order midline to be placed per ID request. 9.  Acute blood loss anemia.  Follow-up CBC  1/29- Hb 8.7 up from 8.0- doing better 10.  Vitamin B12 deficiency.  Continue supplement 11.  Hyperlipidemia.  Lipitor 12.  History of tobacco and alcohol use.  Provide counseling.        LOS: 1 days A FACE TO FACE EVALUATION WAS PERFORMED  Deunte Bledsoe 01/29/2020, 4:21 PM

## 2020-01-29 NOTE — Progress Notes (Signed)
Occupational Therapy Session Note  Patient Details  Name: Terrence Carr MRN: [id number] Date of Birth: 1961-07-07  Today's Date: 01/29/2020 OT Individual Time: 1300-1315 OT Individual Time Calculation (min): 15 min  and Today's Date: 01/29/2020 OT Missed Time: 35 Minutes Missed Time Reason: Nursing care(IV running and pt's LUE required to stay immobile)   Short Term Goals: Week 1:  OT Short Term Goal 1 (Week 1): STGs=LTGs due to ELOS  Skilled Therapeutic Interventions/Progress Updates:  Pt resting in w/c upon arrival.  IV alarm-occluded.  RN states LUE must remain immobile until completed.  Discussed home setup and equipment needs.  Pt states he received BSC during last admission.  Family did not take it home because it wouldn't fit in car with w/c.  Family never returned to pick it up. Pt will have to pay out of pocket for new BSC. Pt missed 45 mins skilled OT services.   Therapy Documentation Precautions:  Precautions Precautions: Fall Precaution Comments: Bil AKA Restrictions Weight Bearing Restrictions: Yes RLE Weight Bearing: Non weight bearing LLE Weight Bearing: Non weight bearing General: General OT Amount of Missed Time: 45 Minutes Vital Signs:  Pain:  Pt denies pain this afternoon   Therapy/Group: Individual Therapy  Leroy Libman 01/29/2020, 1:46 PM

## 2020-01-29 NOTE — Progress Notes (Signed)
Received order for midline.  Receiving ten days of Vancomycin.  Midline not recommended due to Vancomycin PH.  Would recommend PICC insertion.

## 2020-01-29 NOTE — Progress Notes (Signed)
Inpatient Rehabilitation  Patient information reviewed and entered into eRehab system by Antonea Gaut M. Arcenio Mullaly, M.A., CCC/SLP, PPS Coordinator.  Information including medical coding, functional ability and quality indicators will be reviewed and updated through discharge.    

## 2020-01-29 NOTE — Evaluation (Signed)
Occupational Therapy Assessment and Plan  Patient Details  Name: YANNIS BROCE MRN: [id number] Date of Birth: 1961-09-22  OT Diagnosis: acute pain, muscle weakness (generalized), pain in joint and swelling of limb Rehab Potential: Rehab Potential (ACUTE ONLY): Fair ELOS: 7-10 days   Today's Date: 01/29/2020 OT Individual Time: 4967-5916 OT Individual Time Calculation (min): 63 min     Problem List:  Patient Active Problem List   Diagnosis Date Noted  . S/P bilateral above knee amputation ([place]) 01/28/2020  . MSSA bacteremia 01/27/2020  . Gangrene ([place]) 01/25/2020  . PAD (peripheral artery disease) ([place]) 01/25/2020  . Pressure ulcer of left buttock, stage 2 ([place]) 01/01/2020  . Left above-knee amputee ([place]) 12/31/2019  . S/P AKA (above knee amputation) unilateral, left ([place])   . Osteomyelitis of multiple sites ([place])   . Thrombocytosis ([place]) 12/24/2019  . Hypoglycemia 12/24/2019  . Osteomyelitis of ankle or foot, acute, right ([place]) 12/23/2019  . Lactic acidosis 12/23/2019  . Alcohol abuse 12/23/2019  . Alcohol withdrawal ([place]) 12/23/2019  . Macrocytic anemia 12/23/2019  . Sacral decubitus ulcer 12/23/2019  . Hyperglycemia 12/23/2019  . Essential hypertension 12/23/2019  . Peripheral arterial disease ([place]) 12/23/2019  . Vitamin B12 deficiency 12/23/2019    Past Medical History:  Past Medical History:  Diagnosis Date  . Hypertension    Past Surgical History:  Past Surgical History:  Procedure Laterality Date  . AMPUTATION Left 12/24/2019   Procedure: LEFT ABOVE KNEE AMPUTATION;  Surgeon: Rosetta Posner, MD;  Location: [facility];  Service: Vascular;  Laterality: Left;  . AMPUTATION Right 01/26/2020   Procedure: AMPUTATION ABOVE KNEE;  Surgeon: Waynetta Sandy, MD;  Location: [facility];  Service: Vascular;  Laterality: Right;  . FEMORAL-POPLITEAL BYPASS GRAFT Right 12/30/2019   Procedure: RIGHT FEMORAL-POPLITEAL ARTERY BYPASS GRAFT;  Surgeon: Waynetta Sandy,  MD;  Location: [facility];  Service: Vascular;  Laterality: Right;  . LOWER EXTREMITY ANGIOGRAPHY Right 12/28/2019   Procedure: LOWER EXTREMITY ANGIOGRAPHY;  Surgeon: Waynetta Sandy, MD;  Location: [facility];  Service: Cardiovascular;  Laterality: Right;    Assessment & Plan Clinical Impression:  Vint Pola. Rothert is a 59 year old right-handed male with history of hypertension, tobacco and alcohol use as well as vitamin B12 deficiency with peripheral vascular disease revascularization of right iliac artery stenting placement by Dr. 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 Donnetta Hutching as well as right common femoral to below-knee popliteal artery bypass 12/30/2019 receiving inpatient rehab services 12/30/2020 until  01/08/2020 and discharged to home maintained on aspirin.  Per chart review patient lives with spouse and family.  1 level home with ramped entrance.  Patient was using a wheelchair but was able to stand on the right leg and pivot.  Presented 01/25/2020 after being seen by vascular surgery and follow-up with noted extensive gangrenous changes of the right foot.Admission labs hemoglobin 5.6 and transfused 2 units PRBC.  Limb was not felt to be salvageable patient initially somewhat resistant to surgical intervention and underwent right AKA 01/26/2020 per Dr. Donzetta Matters.  Noted blood cultures did show 1/2 staph aureus.  Patient initially placed on vancomycin with follow-up per infectious disease.  Transthoracic echocardiogram with ejection fraction of 65%.  No left ventricular hypertrophy.  No regional wall motion abnormalities.  As well as follow-up repeat blood cultures pending.  Subcutaneous heparin for DVT prophylaxis.  Acute blood loss anemia 8.0.  Therapy evaluations completed with recommendations of physical medicine  rehab consult.  Patient currently requires supervision- max with basic self-care skills secondary to muscle weakness, decreased  cardiorespiratoy endurance, decreased awareness and decreased balance strategies.  Prior to hospitalization, patient could complete BADLs with min.  Patient will benefit from skilled intervention to increase independence with basic self-care skills prior to discharge home with family.  Anticipate patient will require 24 hour supervision and minimal physical assistance and follow up home health.  OT - End of Session Endurance Deficit: Yes OT Assessment Rehab Potential (ACUTE ONLY): Fair OT Barriers to Discharge: Wound Care;Medical stability;Behavior OT Barriers to Discharge Comments: Pt can exhibit self limiting behaviors which impacts participation OT Patient demonstrates impairments in the following area(s): Balance;Behavior;Cognition;Endurance;Motor;Pain;Safety;Skin Integrity OT Basic ADL's Functional Problem(s): Dressing;Toileting OT Advanced ADL's Functional Problem(s): Simple Meal Preparation OT Transfers Functional Problem(s): Toilet OT Additional Impairment(s): None OT Plan OT Intensity: Minimum of 1-2 x/day, 45 to 90 minutes OT Frequency: 5 out of 7 days OT Duration/Estimated Length of Stay: 7-10 days OT Treatment/Interventions: Balance/vestibular training;DME/adaptive equipment instruction;Patient/family education;Therapeutic Activities;Wheelchair propulsion/positioning;Cognitive remediation/compensation;Psychosocial support;Therapeutic Exercise;UE/LE Strength taining/ROM;Self Care/advanced ADL retraining;Functional mobility training;Discharge planning;Neuromuscular re-education;Community reintegration;Skin care/wound managment;UE/LE Coordination activities;Disease mangement/prevention;Pain management OT Self Feeding Anticipated Outcome(s): No goal OT Basic Self-Care Anticipated Outcome(s): Supervision-Min A OT Toileting Anticipated Outcome(s): Min A OT Bathroom Transfers Anticipated Outcome(s): Supervision OT Recommendation Patient destination: Home Follow Up Recommendations: Home  health OT Equipment Recommended: To be determined Skilled Therapeutic Intervention Skilled OT session completed with focus on initial evaluation, education on OT role/POC, and establishment of patient-centered goals.   Pt greeted in bed, just finished with breakfast. Adamant that he wanted to drink another cup of coffee before his therapy. Supervision for supine<sit. He drank his coffee EOB, able to set himself up with creamers and sugars unassisted. Note that pt maintained unilateral support on bed for stable sitting balance. While he drank, he participated in informal eval assessments and PLOF questions. Pt reported that after d/c from CIR earlier this month he was "walking" at home and family would only "check on him" during self care and transfers, though per chart pt was receiving assist from family PTA. He then engaged in bathing/dressing tasks EOB and bedlevel. Increased time required to complete stated tasks as pt is very slow and deliberate. Supervision for dynamic sitting balance while he donned overhead shirt. Pt reported he soiled his brief at start of session but wanted to wait until end of session to complete hygiene. Supervision for rolling Rt>Lt in hospital bed with increased time and use of bedrails. His brief was clean, but pt able to complete hygiene in the front and back himself given setup assist. OT donned new brief and pants with Max A. He remained in bed at end of session with all needs within reach and bed alarm set. Notified NT that he wanted another cup of coffee.    Pt also reports wanting a prosthetic post d/c. Discussed importance of not resting with pillows under thighs for contracture prevention. Discussed ways to facilitate hip extension in bed in prep for prosthesis in the future.  OT Evaluation Precautions/Restrictions  Precautions Precautions: Fall Precaution Comments: Bil AKA Restrictions Weight Bearing Restrictions: Yes RLE Weight Bearing: Weight bearing as  tolerated LLE Weight Bearing: Weight bearing as tolerated Pain: in residual limb, per RN pt premedicated for pain. Reviewed tap/massage techniques during session. He also reported phantom pain sensations and limb loss education provided.   Pain Assessment Pain Score: 0-No pain Home Living/Prior Functioning Home Living Family/patient expects to be discharged to::  Private residence Living Arrangements: Spouse/significant other, Children, Other relatives Available Help at Discharge: Family, Available 24 hours/day Type of Home: House Home Access: Ramped entrance [place]: One level Bathroom Shower/Tub: Multimedia programmer: Standard Bathroom Accessibility: Yes Additional Comments: Pt reports home is w/c accessible  Lives With: Family, Significant other IADL History Homemaking Responsibilities: No Prior Function Level of Independence: Needs assistance with ADLs ADL ADL Grooming: Setup Where Assessed-Grooming: Edge of bed Upper Body Bathing: Setup Where Assessed-Upper Body Bathing: Edge of bed Lower Body Bathing: Supervision/safety Where Assessed-Lower Body Bathing: Edge of bed, Bed level Upper Body Dressing: Supervision/safety Where Assessed-Upper Body Dressing: Edge of bed Lower Body Dressing: Maximal assistance Where Assessed-Lower Body Dressing: Bed level Toileting: Not assessed Toilet Transfer: Not assessed Tub/Shower Transfer: Not assessed  Perception  Perception: Within Functional Limits Praxis Praxis: Intact Cognition Arousal/Alertness: Awake/alert Orientation Level: Person;Place;Situation Person: Oriented Place: Oriented Situation: Oriented Year: Other (Comment)(2001) Month: July Day of Week: Correct Immediate Memory Recall: Sock;Bed Memory Recall Sock: Without Cue Memory Recall Blue: Not able to recall Memory Recall Bed: Without Cue Sensation Sensation Light Touch: Appears Intact(RLE increased sensitivity 2/2 AKA) Coordination Gross Motor  Movements are Fluid and Coordinated: No Fine Motor Movements are Fluid and Coordinated: Yes Coordination and Movement Description: R LE wounds and sacral wound affecting moiblity, new L AKA Finger Nose Finger Test: Mild dysmetria with Lt, WNL Rt Motor  Motor Motor: Other (comment) Motor - Skilled Clinical Observations: generalized weakness Balance Balance Balance Assessed: Yes Dynamic Sitting Balance Dynamic Sitting - Balance Support: No upper extremity supported;Feet unsupported;During functional activity Dynamic Sitting - Level of Assistance: 5: Stand by assistance Dynamic Sitting - Balance Activities: Lateral lean/weight shifting;Forward lean/weight shifting(donning shirt) Extremity/Trunk Assessment RUE Assessment RUE Assessment: Exceptions to [facility] Active Range of Motion (AROM) Comments: Pt drifts into scaption vs shoulder flexion ~110 degrees with truncal compensatory strategies, ~90 degrees abduction General Strength Comments: 3+/5 grossly LUE Assessment LUE Assessment: Exceptions to [facility] Active Range of Motion (AROM) Comments: Active assist ~45-60 degrees scaption + abduction respectively General Strength Comments: 3-/5 grossly     Refer to Care Plan for Long Term Goals  Recommendations for other services: None    Discharge Criteria: Patient will be discharged from OT if patient refuses treatment 3 consecutive times without medical reason, if treatment goals not met, if there is a change in medical status, if patient makes no progress towards goals or if patient is discharged from hospital.  The above assessment, treatment plan, treatment alternatives and goals were discussed and mutually agreed upon: by patient  Skeet Simmer 01/29/2020, 12:22 PM

## 2020-01-29 NOTE — Evaluation (Addendum)
Physical Therapy Assessment and Plan  Patient Details  Name: Terrence Carr MRN: [id number] Date of Birth: Apr 21, 1961  PT Diagnosis: Abnormal posture, Difficulty walking, Muscle weakness and Pain in RLE Rehab Potential: Good ELOS: 7-10 days   Today's Date: 01/29/2020 PT Individual Time: 1000-1100 PT Individual Time Calculation (min): 60 min    Problem List:  Patient Active Problem List   Diagnosis Date Noted  . S/P bilateral above knee amputation ([place]) 01/28/2020  . MSSA bacteremia 01/27/2020  . Gangrene ([place]) 01/25/2020  . PAD (peripheral artery disease) ([place]) 01/25/2020  . Pressure ulcer of left buttock, stage 2 ([place]) 01/01/2020  . Left above-knee amputee ([place]) 12/31/2019  . S/P AKA (above knee amputation) unilateral, left ([place])   . Osteomyelitis of multiple sites ([place])   . Thrombocytosis ([place]) 12/24/2019  . Hypoglycemia 12/24/2019  . Osteomyelitis of ankle or foot, acute, right ([place]) 12/23/2019  . Lactic acidosis 12/23/2019  . Alcohol abuse 12/23/2019  . Alcohol withdrawal ([place]) 12/23/2019  . Macrocytic anemia 12/23/2019  . Sacral decubitus ulcer 12/23/2019  . Hyperglycemia 12/23/2019  . Essential hypertension 12/23/2019  . Peripheral arterial disease ([place]) 12/23/2019  . Vitamin B12 deficiency 12/23/2019    Past Medical History:  Past Medical History:  Diagnosis Date  . Hypertension    Past Surgical History:  Past Surgical History:  Procedure Laterality Date  . AMPUTATION Left 12/24/2019   Procedure: LEFT ABOVE KNEE AMPUTATION;  Surgeon: Rosetta Posner, MD;  Location: [facility];  Service: Vascular;  Laterality: Left;  . AMPUTATION Right 01/26/2020   Procedure: AMPUTATION ABOVE KNEE;  Surgeon: Waynetta Sandy, MD;  Location: [facility];  Service: Vascular;  Laterality: Right;  . FEMORAL-POPLITEAL BYPASS GRAFT Right 12/30/2019   Procedure: RIGHT FEMORAL-POPLITEAL ARTERY BYPASS GRAFT;  Surgeon: Waynetta Sandy, MD;  Location: [facility];  Service: Vascular;   Laterality: Right;  . LOWER EXTREMITY ANGIOGRAPHY Right 12/28/2019   Procedure: LOWER EXTREMITY ANGIOGRAPHY;  Surgeon: Waynetta Sandy, MD;  Location: [facility];  Service: Cardiovascular;  Laterality: Right;    Assessment & Plan Clinical Impression:  Terrence Carr is a 59 year old right-handed male with history of hypertension, tobacco and alcohol use as well as vitamin B12 deficiency with peripheral vascular disease revascularization of right iliac artery stenting placement by Dr. 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 Donnetta Hutching as well as right common femoral to below-knee popliteal artery bypass 12/30/2019 receiving inpatient rehab services 12/30/2020 until  01/08/2020 and discharged to home maintained on aspirin.  Per chart review patient lives with spouse and family.  1 level home with ramped entrance.  Patient was using a wheelchair but was able to stand on the right leg and pivot.  Presented 01/25/2020 after being seen by vascular surgery and follow-up with noted extensive gangrenous changes of the right foot.Admission labs hemoglobin 5.6 and transfused 2 units PRBC.  Limb was not felt to be salvageable patient initially somewhat resistant to surgical intervention and underwent right AKA 01/26/2020 per Dr. Donzetta Matters.  Noted blood cultures did show 1/2 staph aureus.  Patient initially placed on vancomycin with follow-up per infectious disease.  Transthoracic echocardiogram with ejection fraction of 65%.  No left ventricular hypertrophy.  No regional wall motion abnormalities.  As well as follow-up repeat blood cultures pending.  Subcutaneous heparin for DVT prophylaxis.  Acute blood loss anemia 8.0.  Therapy evaluations completed with recommendations of physical medicine rehab consult. Patient transferred to CIR on  01/28/2020 .   Patient currently requires mod with mobility secondary to muscle weakness, decreased cardiorespiratoy endurance  and decreased sitting balance, decreased postural control and decreased balance strategies.  Prior to hospitalization, patient was supervision with mobility and lived with Significant other, Other (Comment)(per pt report his SO, his daughter, and his granddaughter) in a House home.  Home access is  Ramped entrance.  Patient will benefit from skilled PT intervention to maximize safe functional mobility, minimize fall risk and decrease caregiver burden for planned discharge home with 24 hour supervision.  Anticipate patient will benefit from follow up [place] at discharge.  PT - End of Session Activity Tolerance: Tolerates 30+ min activity with multiple rests Endurance Deficit: Yes Endurance Deficit Description: frequent rest breaks during functional activity PT Assessment Rehab Potential (ACUTE/IP ONLY): Good PT Barriers to Discharge: Medical stability;Incontinence;Lack of/limited family support;Weight bearing restrictions PT Patient demonstrates impairments in the following area(s): Balance;Endurance;Pain;Safety PT Transfers Functional Problem(s): Bed Mobility;Bed to Chair;Car;Floor;Furniture PT Locomotion Functional Problem(s): Ambulation;Wheelchair Mobility;Stairs PT Plan PT Intensity: Minimum of 1-2 x/day ,45 to 90 minutes PT Frequency: 5 out of 7 days PT Duration Estimated Length of Stay: 7-10 days PT Treatment/Interventions: Balance/vestibular training;Community reintegration;Discharge planning;Disease management/prevention;DME/adaptive equipment instruction;Functional mobility training;Neuromuscular re-education;Pain management;Patient/family education;Psychosocial support;Therapeutic Activities;Therapeutic Exercise;UE/LE Strength taining/ROM;UE/LE Coordination activities;Wheelchair propulsion/positioning PT Transfers Anticipated Outcome(s): Supervision PT Locomotion Anticipated Outcome(s): Supervision at w/c level PT Recommendation Recommendations for Other Services: Neuropsych  consult Follow Up Recommendations: Home health PT Patient destination: Home Equipment Recommended: Other (comment)(already owns w/c from previous rehab stay)  Skilled Therapeutic Intervention Evaluation completed (see details above and below) with education on PT POC and goals and individual treatment initiated with focus on functional transfer assessment, discussion of rehab LOS and goals, and assessment of w/c mobility. Pt received seated in bed, agreeable to PT eval. Pt reports some pain in R residual limb, not rated and declines intervention. Pt seated in bed with BLE elevated, education with patient about BLE positioning to avoid hip flexion contracture. Bed mobility with Supervision. A/P transfer bed to w/c with mod A, cueing for safety and hand placement during transfer as well as adequate un-weighting of buttocks during transfer. Pt is at Supervision level for w/c mobility x 150 ft with use of BUE, increased time needed to complete mobility. Pt requires cueing for management of w/c parts. A/P transfer w/c to/from mat table with min A with some lateral shifting of hips to complete transfer safely. Pt is able turn himself in a full circle on mat table with use of BUE to un-weight himself, min to mod A for sitting balance and trunk control. A/P transfer back to w/c with min A. Pt requesting to drink some coffee, pt is setup A to use coffee machine and prepare himself a cup of coffee. Pt then reports urge to urinate, is able to use urinal independently once back in pt room. Pt left seated in w/c in room with needs in reach, quick release belt and chair alarm in place at end of session.  PT Evaluation Precautions/Restrictions Precautions Precautions: Fall Precaution Comments: Bil AKA Restrictions Weight Bearing Restrictions: Yes RLE Weight Bearing: Non weight bearing LLE Weight Bearing: Non weight bearing Home Living/Prior Functioning Home Living Available Help at Discharge: Family;Available 24  hours/day Type of Home: House Home Access: Ramped entrance Home Layout: One level Bathroom Shower/Tub: Multimedia programmer: Standard Bathroom Accessibility: Yes Additional Comments: Pt reports home is w/c accessible  Lives With: Significant other;Other (Comment)(per pt report his SO, his daughter, and  his granddaughter) Prior Function Level of Independence: Needs assistance with tranfers  Able to Take Stairs?: No Driving: No Vision/Perception  Perception Perception: Within Functional Limits Praxis Praxis: Intact  Cognition Overall Cognitive Status: Within Functional Limits for tasks assessed Arousal/Alertness: Awake/alert Orientation Level: Oriented X4 Attention: Sustained Sustained Attention: Appears intact Memory: Appears intact Immediate Memory Recall: Sock;Bed Memory Recall Sock: Without Cue Memory Recall Blue: Not able to recall Memory Recall Bed: Without Cue Awareness: Impaired Awareness Impairment: Anticipatory impairment Problem Solving: Appears intact Safety/Judgment: Appears intact Sensation Sensation Light Touch: Appears Intact(RLE increased sensitivity 2/2 AKA) Proprioception: Appears Intact Coordination Gross Motor Movements are Fluid and Coordinated: No Fine Motor Movements are Fluid and Coordinated: Yes Coordination and Movement Description: impaired 2/2 new R AKA, previous L AKA Finger Nose Finger Test: Mild dysmetria with Lt, WNL Rt Motor  Motor Motor: Other (comment) Motor - Skilled Clinical Observations: generalized weakness  Mobility Bed Mobility Bed Mobility: Rolling Right;Rolling Left;Supine to Sit;Sit to Supine Rolling Right: Supervision/verbal cueing Rolling Left: Supervision/Verbal cueing Supine to Sit: Supervision/Verbal cueing Sit to Supine: Supervision/Verbal cueing Transfers Transfers: Government social research officer Transfer: Moderate Assistance - Patient 50-74% Locomotion  Stairs / Additional  Locomotion Stairs: No Architect: Yes Wheelchair Assistance: Chartered loss adjuster: Both upper extremities Wheelchair Parts Management: Needs assistance Distance: 150  Trunk/Postural Assessment  Cervical Assessment Cervical Assessment: Within Functional Limits Thoracic Assessment Thoracic Assessment: Exceptions to WFL(kyphotic) Lumbar Assessment Lumbar Assessment: Within Functional Limits Postural Control Postural Control: Deficits on evaluation Protective Responses: delayed. Postural Limitations: impaired sitting balance without use of BUE support  Balance Balance Balance Assessed: Yes Static Sitting Balance Static Sitting - Balance Support: Bilateral upper extremity supported Static Sitting - Level of Assistance: 4: Min assist Dynamic Sitting Balance Dynamic Sitting - Balance Support: No upper extremity supported;During functional activity Dynamic Sitting - Level of Assistance: 4: Min assist;3: Mod assist Dynamic Sitting - Balance Activities: Lateral lean/weight shifting;Forward lean/weight shifting(donning shirt) Extremity Assessment  RUE Assessment RUE Assessment: Exceptions to [facility] Active Range of Motion (AROM) Comments: Pt drifts into scaption vs shoulder flexion ~110 degrees with truncal compensatory strategies, ~90 degrees abduction General Strength Comments: 3+/5 grossly LUE Assessment LUE Assessment: Exceptions to [facility] Active Range of Motion (AROM) Comments: Active assist ~45-60 degrees scaption + abduction respectively General Strength Comments: 3-/5 grossly RLE Assessment RLE Assessment: Exceptions to [facility] Passive Range of Motion (PROM) Comments: R AKA General Strength Comments: grossly 3/5 in hip LLE Assessment LLE Assessment: Exceptions to [facility] Passive Range of Motion (PROM) Comments: L AKA General Strength Comments: grossly 4/5 in hip    Refer to Care Plan for Long Term Goals  Recommendations for  other services: Neuropsych  Discharge Criteria: Patient will be discharged from PT if patient refuses treatment 3 consecutive times without medical reason, if treatment goals not met, if there is a change in medical status, if patient makes no progress towards goals or if patient is discharged from hospital.  The above assessment, treatment plan, treatment alternatives and goals were discussed and mutually agreed upon: by patient   Excell Seltzer, PT, DPT 01/29/2020, 12:39 PM

## 2020-01-30 LAB — CULTURE, BLOOD (ROUTINE X 2)
Culture: NO GROWTH
Special Requests: ADEQUATE

## 2020-01-30 NOTE — Plan of Care (Signed)
  Problem: Consults Goal: RH LIMB LOSS PATIENT EDUCATION Description: Description: See Patient Education module for eduction specifics. Outcome: Progressing Goal: Skin Care Protocol Initiated - if Braden Score 18 or less Description: If consults are not indicated, leave blank or document N/A Outcome: Progressing   Problem: RH BOWEL ELIMINATION Goal: RH STG MANAGE BOWEL WITH ASSISTANCE Description: STG Manage Bowel with min Assistance. Outcome: Progressing Goal: RH STG MANAGE BOWEL W/MEDICATION W/ASSISTANCE Description: STG Manage Bowel with Medication with mod I Assistance. Outcome: Progressing   Problem: RH BLADDER ELIMINATION Goal: RH STG MANAGE BLADDER WITH ASSISTANCE Description: STG Manage Bladder With min Assistance Outcome: Progressing   Problem: RH SKIN INTEGRITY Goal: RH STG SKIN FREE OF INFECTION/BREAKDOWN Description: Pt will be free of skin breakdown or infection with min assist prior to DC, improvement to Stage 2 on buttocks as well Outcome: Progressing Goal: RH STG MAINTAIN SKIN INTEGRITY WITH ASSISTANCE Description: STG Maintain Skin Integrity With min Assistance. Outcome: Progressing Goal: RH STG ABLE TO PERFORM INCISION/WOUND CARE W/ASSISTANCE Description: STG Able To Perform Incision/Wound Care With min Assistance. Outcome: Progressing   Problem: RH PAIN MANAGEMENT Goal: RH STG PAIN MANAGED AT OR BELOW PT'S PAIN GOAL Description: Less than 3 on 0-10 scale Outcome: Progressing

## 2020-01-30 NOTE — Progress Notes (Signed)
[facility] PHYSICAL MEDICINE & REHABILITATION PROGRESS NOTE   Subjective/Complaints: No complaints this morning. Denies pain and constipation. Sleeping well at night.   ROS: pt denies SOB, CP, abd pain, N/V/C/D, vision changes or headache   Objective:   No results found. Recent Labs    01/28/20 0342 01/29/20 0545  WBC 9.1 6.5  HGB 8.0* 8.7*  HCT 25.4* 27.8*  PLT 868* 861*   Recent Labs    01/28/20 0342 01/29/20 0545  NA 138 132*  K 4.0 4.0  CL 101 100  CO2 23 22  GLUCOSE 111* 106*  BUN 7 10  CREATININE 0.88 0.72  CALCIUM 8.5* 8.6*    Intake/Output Summary (Last 24 hours) at 01/30/2020 1555 Last data filed at 01/30/2020 1513 Gross per 24 hour  Intake 400 ml  Output 2850 ml  Net -2450 ml     Physical Exam: Vital Signs Blood pressure 127/62, pulse 73, temperature 98 F (36.7 C), resp. rate 19, SpO2 99 %.  Constitutional: He is oriented to person, place, and time.  Frail, thin appearing male with  Very few teeth notable in mouth, sitting up in bed, watching TV, getting initial eval by OT, NAD HENT: Normocephalic and atraumatic Mouth/Throat: No oropharyngeal exudate.  Eyes:  Conjunctivae and EOM are normal.  Neck: No tracheal deviation present.  Cardiovascular: RRR  Respiratory:  CTA B/L- no W/R/R  GI: slightly distended vs protuberant; NT, soft, hypoactive BS  Genitourinary:    Penis normal.  Musculoskeletal:     Cervical back: Normal range of motion     Comments:  5-/5 in UEs- deltoids, biceps, triceps, WE, grip and finger abduction HF at least 3+/5 in LEs- pain on R side R AKA wrapped in ACE wrap L AKA healed with very little muscle/very thin  Neurological: He is oriented to person, place, and time. Mild dysmetria with left FTN, normal on right.  Sensation intact in UEs B/L and B/L AKAs. Increased sensitivity in RLE.  Skin:  Right AKA is dressed appropriately tender.  Left AKA site well-healed with some dry skin.  2 IV's in L forearm- no signs of  infiltration.   Psychiatric: In good spirits.    Assessment/Plan: 1. Functional deficits secondary new R AKA and recent L AKA as well as bacteremia on IV Vanc to which require 3+ hours per day of interdisciplinary therapy in a comprehensive inpatient rehab setting.  Physiatrist is providing close team supervision and 24 hour management of active medical problems listed below.  Physiatrist and rehab team continue to assess barriers to discharge/monitor patient progress toward functional and medical goals  Care Tool:  Bathing    Body parts bathed by patient: Right arm, Left arm, Chest, Abdomen, Face, Front perineal area, Buttocks, Left upper leg     Body parts n/a: Right upper leg, Right lower leg, Left lower leg   Bathing assist Assist Level: Supervision/Verbal cueing     Upper Body Dressing/Undressing Upper body dressing   What is the patient wearing?: Pull over shirt    Upper body assist Assist Level: Supervision/Verbal cueing    Lower Body Dressing/Undressing Lower body dressing      What is the patient wearing?: Incontinence brief, Pants     Lower body assist Assist for lower body dressing: Total Assistance - Patient < 25%     Toileting Toileting Toileting Activity did not occur Landscape architect and hygiene only): N/A (no void or bm)  Toileting assist Assist for toileting: Minimal Assistance - Patient > 75%  Assistive Device Comment: urnial   Transfers Chair/bed transfer  Transfers assist  Chair/bed transfer activity did not occur: N/A  Chair/bed transfer assist level: Moderate Assistance - Patient 50 - 74%     Locomotion Ambulation   Ambulation assist   Ambulation activity did not occur: N/A          Walk 10 feet activity   Assist  Walk 10 feet activity did not occur: N/A        Walk 50 feet activity   Assist Walk 50 feet with 2 turns activity did not occur: N/A         Walk 150 feet activity   Assist Walk 150 feet  activity did not occur: N/A         Walk 10 feet on uneven surface  activity   Assist Walk 10 feet on uneven surfaces activity did not occur: N/A         Wheelchair     Assist Will patient use wheelchair at discharge?: Yes Type of Wheelchair: Manual    Wheelchair assist level: Supervision/Verbal cueing Max wheelchair distance: 150'    Wheelchair 50 feet with 2 turns activity    Assist        Assist Level: Supervision/Verbal cueing   Wheelchair 150 feet activity     Assist      Assist Level: Supervision/Verbal cueing   Blood pressure 127/62, pulse 73, temperature 98 F (36.7 C), resp. rate 19, SpO2 99 %.  Medical Problem List and Plan: 1.  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   1/30: Reviewed care team notes. Participated in [facility] mobility 150 feet yesterday, voiding on own. Supervision to [place] for self-care.  2.  Antithrombotics: -DVT/anticoagulation: Subcutaneous heparin             -antiplatelet therapy: Aspirin 81 mg daily 3. Pain Management: Neurontin 100 mg 3 times daily, oxycodone as needed  1/30: pain is well controlled.  4. Mood: Wellbutrin 100 mg twice daily             -antipsychotic agents: N/A 5. Neuropsych: This patient is capable of making decisions on his own behalf. 6. Skin/Wound Care: Routine skin checks for new R AKA incision 7. Fluids/Electrolytes/Nutrition: Routine in and outs with follow-up chemistries 8.  Staph aureus bacteremia.  Follow-up infectious disease.  Repeat blood cultures pending.  Continue vancomycin and discussed duration with infectious disease; pharmacy dosing Vanc  1/29- 10 more days of IV Vanc per ID- will  order midline to be placed per ID request. 9.  Acute blood loss anemia.  Follow-up CBC  1/29- Hb 8.7 up from 8.0- doing better 10.  Vitamin B12 deficiency.  Continue supplement 11.  Hyperlipidemia.  Lipitor 12.  History of tobacco and alcohol use.  Provide counseling.  LOS: 2 days A FACE TO FACE EVALUATION WAS PERFORMED  Clide Deutscher Suleiman Finigan 01/30/2020, 3:55 PM

## 2020-01-31 ENCOUNTER — Inpatient Hospital Stay (HOSPITAL_COMMUNITY): Payer: Medicaid Other

## 2020-01-31 ENCOUNTER — Inpatient Hospital Stay (HOSPITAL_COMMUNITY): Payer: Medicaid Other | Admitting: Occupational Therapy

## 2020-01-31 MED ORDER — SENNOSIDES-DOCUSATE SODIUM 8.6-50 MG PO TABS
1.0000 | ORAL_TABLET | Freq: Two times a day (BID) | ORAL | Status: DC
  Administered 2020-01-31 – 2020-02-08 (×15): 1 via ORAL
  Filled 2020-01-31 (×17): qty 1

## 2020-01-31 MED ORDER — MELATONIN 3 MG PO TABS
3.0000 mg | ORAL_TABLET | Freq: Every evening | ORAL | Status: DC | PRN
  Filled 2020-01-31: qty 1

## 2020-01-31 MED ORDER — NON FORMULARY: 3.0000 mg | Freq: Every evening | Status: DC | PRN

## 2020-01-31 NOTE — Progress Notes (Signed)
Social Work Assessment and Plan   Patient Details  Name: Terrence Carr MRN: [id number] Date of Birth: 15-Mar-1961  Today's Date: 01/29/2020  Problem List:  Patient Active Problem List   Diagnosis Date Noted  . S/P bilateral above knee amputation ([place]) 01/28/2020  . MSSA bacteremia 01/27/2020  . Gangrene ([place]) 01/25/2020  . PAD (peripheral artery disease) ([place]) 01/25/2020  . Pressure ulcer of left buttock, stage 2 ([place]) 01/01/2020  . Left above-knee amputee ([place]) 12/31/2019  . S/P AKA (above knee amputation) unilateral, left ([place])   . Osteomyelitis of multiple sites ([place])   . Thrombocytosis ([place]) 12/24/2019  . Hypoglycemia 12/24/2019  . Osteomyelitis of ankle or foot, acute, right ([place]) 12/23/2019  . Lactic acidosis 12/23/2019  . Alcohol abuse 12/23/2019  . Alcohol withdrawal ([place]) 12/23/2019  . Macrocytic anemia 12/23/2019  . Sacral decubitus ulcer 12/23/2019  . Hyperglycemia 12/23/2019  . Essential hypertension 12/23/2019  . Peripheral arterial disease ([place]) 12/23/2019  . Vitamin B12 deficiency 12/23/2019   Past Medical History:  Past Medical History:  Diagnosis Date  . Hypertension    Past Surgical History:  Past Surgical History:  Procedure Laterality Date  . AMPUTATION Left 12/24/2019   Procedure: LEFT ABOVE KNEE AMPUTATION;  Surgeon: Rosetta Posner, MD;  Location: [facility];  Service: Vascular;  Laterality: Left;  . AMPUTATION Right 01/26/2020   Procedure: AMPUTATION ABOVE KNEE;  Surgeon: Waynetta Sandy, MD;  Location: [facility];  Service: Vascular;  Laterality: Right;  . FEMORAL-POPLITEAL BYPASS GRAFT Right 12/30/2019   Procedure: RIGHT FEMORAL-POPLITEAL ARTERY BYPASS GRAFT;  Surgeon: Waynetta Sandy, MD;  Location: [facility];  Service: Vascular;  Laterality: Right;  . LOWER EXTREMITY ANGIOGRAPHY Right 12/28/2019   Procedure: LOWER EXTREMITY ANGIOGRAPHY;  Surgeon: Waynetta Sandy, MD;  Location: [facility];  Service: Cardiovascular;   Laterality: Right;   Social History:  reports that he has been smoking. He has been smoking about 0.50 packs per day. He has never used smokeless tobacco. He reports current alcohol use. He reports that he does not use drugs.  Family / Support Systems Marital Status: (fiance) Patient Roles: Partner Spouse/Significant Other: girlfriend, Rozella, in the home but with health issues Children: step-daughter, Amedeo Kinsman @ [phone] Other Supports: step-granddaughter, Sandria Manly @ [phone] Anticipated Caregiver: Graylon Good and Mamie Laurel Ability/Limitations of Caregiver: Graylon Good works, Mamie Laurel is there when Graylon Good is working to care for [name] and patient Caregiver Availability: 24/7 Family Dynamics: Patient's stepdaughter and granddaughter have been providing assistance and will continue to do so.  Social History Preferred language: English Religion: Non-Denominational Cultural Background: NA Read: Yes Write: Yes Employment Status: Disabled Public relations account executive Issues: None Guardian/Conservator: None - per MD, pt is capable of making decisions on his own behalf   Abuse/Neglect Abuse/Neglect Assessment Can Be Completed: Yes Physical Abuse: Denies Verbal Abuse: Denies Sexual Abuse: Denies Exploitation of patient/patient's resources: Denies Self-Neglect: Denies  Emotional Status Pt's affect, behavior and adjustment status: Pt is pleasant and completes assessment interview without much difficulty, however, mostly focused on wanting to make his point about readiness for d/c as he did during his prior stay on CIR.  He denies any significant emotional distress. Recent Psychosocial Issues: recent CIR stay after first AKA Psychiatric History: None Substance Abuse History: none  Patient / Family Perceptions, Expectations & Goals Pt/Family understanding of illness & functional limitations: Pt and step-daughter with general understanding of his medical issues and current functional  limitations/ need for CIR. Premorbid pt/family roles/activities: Pt requiring assistance since  last CIR d/c. Anticipated changes in roles/activities/participation: Step-daughter and granddaughter will need to continue to provide more physical assistance. Pt/family expectations/goals: "I just need to get home"  [employer]: None Premorbid Home Care/DME Agencies: Other (Comment)([facility]) Transportation available at discharge: yes  Discharge Planning Living Arrangements: Spouse/significant other, Children Support Systems: Children Type of Residence: Private residence Insurance Resources: [employer] (specify county) Museum/gallery curator Resources: [place] Referred: No Money Management: Patient Does the patient have any problems obtaining your medications?: No Home Management: pt and step-daughter/ grand Patient/Family Preliminary Plans: Pt to return home with step-daughter and others to provide needed assistance. Social Work Anticipated Follow Up Needs: HH/OP Expected length of stay: 7-10 days  Clinical Impression Elderly, frail appearing gentleman who returns to CIR approximately 3 weeks after prior discharge.  Now with another AKA and plan remains for him to return home with stepdaughter and granddaughter providing assistance.  As during his prior CIR stay, patient very focused on needing to get home as soon as possible.  Will follow for discharge planning needs.  Jonalyn Sedlak 01/29/2020, 12:15 PM

## 2020-01-31 NOTE — Progress Notes (Signed)
Physical Therapy Session Note  Patient Details  Name: [name] MRN: [id number] Date of Birth: 11-08-1961  Today's Date: 01/31/2020 PT Individual Time: [id number] PT Individual Time Calculation (min): 68 min   Short Term Goals: Week 1:  PT Short Term Goal 1 (Week 1): =LTG due to ELOS  Skilled Therapeutic Interventions/Progress Updates:    Rewrapped R residual limb for proper compression and educated on importance of rewrapping every 4-5 hours. Functional bed mobility mod I with extra time and use of bedrails for support. Performed posterior transfer OOB to w/c with min assist and cues for hand placement. Performed oral hygiene at sink from w/c mod I. W/c mobility on unit with extra time for functional strengthening/endurance and mobility training x 150' x2. Performed Anterior transfer to mat table fairly level with w/c (pt reports his bed is similar in height to his w/c) with CGA and cues for technique. Instructed in core strengthening exercises with 2kg weighted medicine ball for diagonals each direction x 15 reps x 2 sets, trunk rotation x 15 reps x 2 sets and mini crunches x 15 reps x 2 sets. PT provided overpressure to stretch anterior hip musculature and educated on importance of getting into complete supine and/or prone if able to tolerate. Declines prone at this time due to pain. In sidelying performed hip abduction and hip extension x 15 reps each x 2 sets on BLE. Posterior transfer back to w/c with CGA demonstrating good carryover of technique. End of session set up with all needs in reach.  Therapy Documentation Precautions:  Precautions Precautions: Fall Precaution Comments: Bil AKA Restrictions Weight Bearing Restrictions: Yes RLE Weight Bearing: Non weight bearing LLE Weight Bearing: Non weight bearing  Pain: Reports pain in RLE - just received pain medication from RN.    Therapy/Group: Individual Therapy  Canary Brim Ivory Broad, PT, DPT,  CBIS  01/31/2020, 8:55 AM

## 2020-01-31 NOTE — Care Management (Signed)
[facility] Individual Statement of Services  Patient Name:  Terrence Carr  Date:  01/31/2020  Welcome to the [facility].  Our goal is to provide you with an individualized program based on your diagnosis and situation, designed to meet your specific needs.  With this comprehensive rehabilitation program, you will be expected to participate in at least 3 hours of rehabilitation therapies Monday-Friday, with modified therapy programming on the weekends.  Your rehabilitation program will include the following services:  Physical Therapy (PT), Occupational Therapy (OT), 24 hour per day rehabilitation nursing, Therapeutic Recreaction (TR), Case Management (Social Worker), Rehabilitation Medicine, Nutrition Services and Pharmacy Services  Weekly team conferences will be held on Tuesdays to discuss your progress.  Your Social Worker will talk with you frequently to get your input and to update you on team discussions.  Team conferences with you and your family in attendance may also be held.  Expected length of stay: 7-10 days   Overall anticipated outcome: supervision/ min assist w/c level  Depending on your progress and recovery, your program may change. Your Social Worker will coordinate services and will keep you informed of any changes. Your Social Worker's name and contact numbers are listed  below.  The following services may also be recommended but are not provided by the [facility] will be made to provide these services after discharge if needed.  Arrangements include referral to agencies that provide these services.  Your insurance has been verified to be:  Medicaid Your primary doctor is:  none  Pertinent information will be shared with your doctor and your insurance company.  Social Worker:  [place], [place] or [phone]   Information discussed with and copy given to patient by: Lennart Pall, 01/31/2020, 2:06 PM

## 2020-01-31 NOTE — Progress Notes (Signed)
Pharmacy Antibiotic Note  Terrence Carr is a 59 y.o. male admitted on 01/28/2020 with right foot/leg gangrene. S/P AKA of right leg yesterday, now Quadriplegic. Now with staph aureus in 1/2 blood cultures.  Pharmacy has been consulted for vancomycin dosing.  Noted that patient has lower body mass due to bilateral AKAs. Patient remains afebrile, last WBC 6.5 (1/29), renal fxn stable - Sr 0.72.   Plan: -Continue Vancomycin 500 mg every 12 hours (Predicted AUC 474 with SCr 1)  -Abx stop date entered for 2/10 -Monitor renal function, levels as appropriate   Temp (24hrs), Avg:97.9 F (36.6 C), Min:97.5 F (36.4 C), Max:98.2 F (36.8 C)  Recent Labs  Lab 01/25/20 2115 01/26/20 1018 01/27/20 0252 01/28/20 0342 01/29/20 0545  WBC 12.4*  --  12.1* 9.1 6.5  CREATININE 0.92 0.60* 0.79 0.88 0.72  LATICACIDVEN 2.1*  --   --   --   --     Estimated Creatinine Clearance: 75 mL/min (by C-G formula based on SCr of 0.72 mg/dL).    Allergies  Allergen Reactions  . Ibuprofen Other (See Comments)    Patient said "the little brown pills" that he bought from the store made him talk out of his head (reported by [facility])  . Other Other (See Comments)    Unknown reaction to "Dihydroxyalaminum aminoacetate magnesium carbonate" (reported by [facility]   Antibiotics: Vanc 1/27 >> (2/10) - stop date entered  Microbiology: 1/25 Bcx: 1/2 staph aureus  1/26 MRSA pcr + 1/25 covid/flu neg  1/27 bld x2: ngtd   Lorel Monaco, PharmD PGY1 [place] Resident [employer] # [phone]

## 2020-01-31 NOTE — Progress Notes (Signed)
Occupational Therapy Session Note  Patient Details  Name: Terrence Carr MRN: [id number] Date of Birth: 1961/08/25  Today's Date: 01/31/2020 OT Individual Time: 1052-1202 OT Individual Time Calculation (min): 70 min   Short Term Goals: Week 1:  OT Short Term Goal 1 (Week 1): STGs=LTGs due to ELOS  Skilled Therapeutic Interventions/Progress Updates:    Pt greeted in bed, denying pain but stated that he had just transferred back to bed and would not get OOB due to fatigue. Strongly encouraged attempting AP BSC or toilet transfer, explaining technique to pt who asked questions appropriately. Pt willing to complete AP transfer at a later date when he is not so fatigued. Pt agreeable to bedlevel therex. Guided pt through UB exercises involving yellow theraband, unweighted ball, and 2 kg ball 20 reps 2 sets each exercise. He needed min facilitation for L UE due to jerky ataxic movements and limited shoulder ROM. Worked on rolling to position himself in prone with bed flat, pt initially needing Min A fading to setup assist. Discussed benefits of prone position for hip extension in preparation for LE prosthetics. Pt tolerated prone for 2 minutes x3 sets. He reported he felt a good stretch and that prone is his most comfortable sleeping position. OT strongly encouraged pt to sleep or rest this way whenever possible for therapeutic benefits stated above. At end of session pt remained in bed with all needs within reach and bed alarm set.    Therapy Documentation Precautions:  Precautions Precautions: Fall Precaution Comments: Bil AKA Restrictions Weight Bearing Restrictions: Yes RLE Weight Bearing: Non weight bearing LLE Weight Bearing: Non weight bearing Vital Signs: Therapy Vitals Temp: 98.4 F (36.9 C) Temp Source: Oral Pulse Rate: 65 Resp: 17 BP: (!) 124/55 Patient Position (if appropriate): Sitting Oxygen Therapy SpO2: 97 % O2 Device: Room Air ADL: ADL Grooming: Setup Where  Assessed-Grooming: Edge of bed Upper Body Bathing: Setup Where Assessed-Upper Body Bathing: Edge of bed Lower Body Bathing: Supervision/safety Where Assessed-Lower Body Bathing: Edge of bed, Bed level Upper Body Dressing: Supervision/safety Where Assessed-Upper Body Dressing: Edge of bed Lower Body Dressing: Maximal assistance Where Assessed-Lower Body Dressing: Bed level Toileting: Not assessed Toilet Transfer: Not assessed Tub/Shower Transfer: Not assessed      Therapy/Group: Individual Therapy  Graysen Woodyard A Maurice Fotheringham 01/31/2020, 3:46 PM

## 2020-01-31 NOTE — Progress Notes (Signed)
[place] PHYSICAL MEDICINE & REHABILITATION PROGRESS NOTE   Subjective/Complaints: No complaints this morning. Denies pain. +constipation Did not sleep well last night.   ROS: pt denies SOB, CP, abd pain, N/V/C/D, vision changes or headache   Objective:   No results found. Recent Labs    01/29/20 0545  WBC 6.5  HGB 8.7*  HCT 27.8*  PLT 861*   Recent Labs    01/29/20 0545  NA 132*  K 4.0  CL 100  CO2 22  GLUCOSE 106*  BUN 10  CREATININE 0.72  CALCIUM 8.6*    Intake/Output Summary (Last 24 hours) at 01/31/2020 1156 Last data filed at 01/31/2020 0740 Gross per 24 hour  Intake 702 ml  Output 2200 ml  Net -1498 ml     Physical Exam: Vital Signs Blood pressure 135/71, pulse 62, temperature (!) 97.5 F (36.4 C), resp. rate 18, SpO2 99 %.  Constitutional: He is oriented to person, place, and time.  Frail, thin appearing male.  Sitting up in chair comfortably.  HENT: Normocephalic and atraumatic Mouth/Throat: No oropharyngeal exudate.  Eyes:  Conjunctivae and EOM are normal.  Neck: No tracheal deviation present.  Cardiovascular: RRR  Respiratory:  CTA B/L- no W/R/R  GI: slightly distended vs protuberant; NT, soft, hypoactive BS  Genitourinary:    Penis normal.  Musculoskeletal:     Cervical back: Normal range of motion     Comments:  5-/5 in UEs- deltoids, biceps, triceps, WE, grip and finger abduction HF at least 3+/5 in LEs- pain on R side R AKA wrapped in ACE wrap L AKA healed with very little muscle/very thin  Neurological: He is oriented to person, place, and time. Mild dysmetria with left FTN, normal on right.  Sensation intact in UEs B/L and B/L AKAs. Increased sensitivity in RLE.  Skin:  Right AKA is dressed appropriately tender.  Left AKA site well-healed with some dry skin.  2 IV's in L forearm- no signs of infiltration.   Psychiatric: In good spirits.    Assessment/Plan: 1. Functional deficits secondary new R AKA and recent L AKA as  well as bacteremia on IV Vanc to which require 3+ hours per day of interdisciplinary therapy in a comprehensive inpatient rehab setting.  Physiatrist is providing close team supervision and 24 hour management of active medical problems listed below.  Physiatrist and rehab team continue to assess barriers to discharge/monitor patient progress toward functional and medical goals  Care Tool:  Bathing    Body parts bathed by patient: Right arm, Left arm, Chest, Abdomen, Face, Front perineal area, Buttocks, Left upper leg     Body parts n/a: Right upper leg, Right lower leg, Left lower leg   Bathing assist Assist Level: Supervision/Verbal cueing     Upper Body Dressing/Undressing Upper body dressing   What is the patient wearing?: Pull over shirt    Upper body assist Assist Level: Supervision/Verbal cueing    Lower Body Dressing/Undressing Lower body dressing      What is the patient wearing?: Incontinence brief, Pants     Lower body assist Assist for lower body dressing: Total Assistance - Patient < 25%     Toileting Toileting Toileting Activity did not occur Landscape architect and hygiene only): N/A (no void or bm)  Toileting assist Assist for toileting: Minimal Assistance - Patient > 75% Assistive Device Comment: urnial   Transfers Chair/bed transfer  Transfers assist  Chair/bed transfer activity did not occur: N/A  Chair/bed transfer assist level: Minimal Assistance -  Patient > 75%     Locomotion Ambulation   Ambulation assist   Ambulation activity did not occur: N/A          Walk 10 feet activity   Assist  Walk 10 feet activity did not occur: N/A        Walk 50 feet activity   Assist Walk 50 feet with 2 turns activity did not occur: N/A         Walk 150 feet activity   Assist Walk 150 feet activity did not occur: N/A         Walk 10 feet on uneven surface  activity   Assist Walk 10 feet on uneven surfaces activity did not  occur: N/A         Wheelchair     Assist Will patient use wheelchair at discharge?: Yes Type of Wheelchair: Manual    Wheelchair assist level: Independent Max wheelchair distance: 150'    Wheelchair 50 feet with 2 turns activity    Assist        Assist Level: Independent   Wheelchair 150 feet activity     Assist      Assist Level: Supervision/Verbal cueing   Blood pressure 135/71, pulse 62, temperature (!) 97.5 F (36.4 C), resp. rate 18, SpO2 99 %.  Medical Problem List and Plan: 1.  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   1/30: Reviewed care team notes. Participated in [facility] mobility 150 feet yesterday, voiding on own. Supervision to [place] for self-care.  2.  Antithrombotics: -DVT/anticoagulation: Subcutaneous heparin             -antiplatelet therapy: Aspirin 81 mg daily 3. Pain Management: Neurontin 100 mg 3 times daily, oxycodone as needed  1/30: pain is well controlled.  4. Mood: Wellbutrin 100 mg twice daily             -antipsychotic agents: N/A 5. Neuropsych: This patient is capable of making decisions on his own behalf. 6. Skin/Wound Care: Routine skin checks for new R AKA incision 7. Fluids/Electrolytes/Nutrition: Routine in and outs with follow-up chemistries 8.  Staph aureus bacteremia.  Follow-up infectious disease.  Repeat blood cultures pending.  Continue vancomycin and discussed duration with infectious disease; pharmacy dosing Vanc  1/29- 10 more days of IV Vanc per ID- will order midline to be placed per ID request. 9.  Acute blood loss anemia.  Follow-up CBC  1/29- Hb 8.7 up from 8.0- doing better 10.  Vitamin B12 deficiency.   Continue supplement 11.  Hyperlipidemia.  Lipitor 12.  History of tobacco and alcohol use.  Provide counseling. 13. Constipation: 1/31: started senna-docusate BID 14. Insomnia: started 3mg  melatonin PRN bedtime with one additional dose prn if needed.  LOS: 3 days A FACE TO FACE EVALUATION WAS PERFORMED  Terrence Carr Terrence Carr 01/31/2020, 11:56 AM

## 2020-01-31 NOTE — Progress Notes (Signed)
Physical Therapy Session Note  Patient Details  Name: MAJER BANSAL MRN: [id number] Date of Birth: Dec 18, 1961  Today's Date: 01/31/2020 PT Individual Time: 1336-1431 PT Individual Time Calculation (min): 55 min   Short Term Goals: Week 1:  PT Short Term Goal 1 (Week 1): =LTG due to ELOS  Skilled Therapeutic Interventions/Progress Updates:    Pt with questions about prosthetic process and his expectation of leaving the hospital with his L prosthesis. educated in general on the process of being fitted for prosthetic, a wear schedule, and usually a need for PT to learn how to don/doff and perform mobility with it. Encouraged discussion with primary MD for further follow up. Bed mobility mod I and performed CGA posterior transfer OOB to w/c with verbal cues for safety. Pt self propelled w/c on unit mod I for functional mobility and general strengthening/endurance. Educated on and then practiced simulated car transfer using slideboard. Educated on different techniques, use of slideboard due to the increased space between w/c and seat of car/fall risk, and recommendation for level transfers (not SUV or truck due to significant height difference). Pt reports he gets rides from several people but agreed for the need for a sedan. Demonstrated transfer to patient and then pt practiced and required overall min assist for transfer in and out of car. Requires assist with placement of slideboard. UE ergometer for UE strengthening on random program x 3 min forward and x 2 min backward at self selected pace and time due to pt reported UE fatigue.   Therapy Documentation Precautions:  Precautions Precautions: Fall Precaution Comments: Bil AKA Restrictions Weight Bearing Restrictions: Yes RLE Weight Bearing: Non weight bearing LLE Weight Bearing: Non weight bearing  Pain: Denies pain.   Therapy/Group: Individual Therapy  Canary Brim Ivory Broad, PT, DPT, CBIS  01/31/2020, 2:33 PM

## 2020-02-01 ENCOUNTER — Inpatient Hospital Stay: Payer: Self-pay

## 2020-02-01 ENCOUNTER — Inpatient Hospital Stay (HOSPITAL_COMMUNITY): Payer: Medicaid Other

## 2020-02-01 ENCOUNTER — Inpatient Hospital Stay (HOSPITAL_COMMUNITY): Payer: Medicaid Other | Admitting: Occupational Therapy

## 2020-02-01 ENCOUNTER — Inpatient Hospital Stay (HOSPITAL_COMMUNITY): Payer: Medicaid Other | Admitting: Physical Therapy

## 2020-02-01 DIAGNOSIS — B9561 Methicillin susceptible Staphylococcus aureus infection as the cause of diseases classified elsewhere: Secondary | ICD-10-CM

## 2020-02-01 DIAGNOSIS — I739 Peripheral vascular disease, unspecified: Secondary | ICD-10-CM

## 2020-02-01 LAB — CULTURE, BLOOD (ROUTINE X 2)
Culture: NO GROWTH
Culture: NO GROWTH
Special Requests: ADEQUATE
Special Requests: ADEQUATE

## 2020-02-01 MED ORDER — SODIUM CHLORIDE 0.9% FLUSH
10.0000 mL | INTRAVENOUS | Status: DC | PRN
  Administered 2020-02-02 – 2020-02-07 (×3): 10 mL

## 2020-02-01 MED ORDER — VANCOMYCIN HCL 500 MG IV SOLR: 500.0000 mg | Freq: Two times a day (BID) | INTRAVENOUS | Status: DC

## 2020-02-01 MED ORDER — VANCOMYCIN HCL IN DEXTROSE 500-5 MG/100ML-% IV SOLN
500.0000 mg | Freq: Two times a day (BID) | INTRAVENOUS | Status: DC
  Administered 2020-02-01: 11:00:00 500 mg via INTRAVENOUS
  Filled 2020-02-01 (×2): qty 100

## 2020-02-01 MED ORDER — SODIUM CHLORIDE 0.9% FLUSH
10.0000 mL | Freq: Two times a day (BID) | INTRAVENOUS | Status: DC
  Administered 2020-02-01 – 2020-02-04 (×7): 10 mL
  Administered 2020-02-05: 30 mL
  Administered 2020-02-05 – 2020-02-08 (×6): 10 mL

## 2020-02-01 MED ORDER — VANCOMYCIN HCL 500 MG/100ML IV SOLN
500.0000 mg | Freq: Two times a day (BID) | INTRAVENOUS | Status: DC
  Administered 2020-02-01 – 2020-02-08 (×13): 500 mg via INTRAVENOUS
  Filled 2020-02-01 (×14): qty 100

## 2020-02-01 MED ORDER — CHLORHEXIDINE GLUCONATE CLOTH 2 % EX PADS
6.0000 | MEDICATED_PAD | Freq: Every day | CUTANEOUS | Status: DC
  Administered 2020-02-02 – 2020-02-08 (×7): 6 via TOPICAL

## 2020-02-01 NOTE — Progress Notes (Signed)
PIV consult: IV established by bedside RN. Cancel consult. Please consider PICC for remainder of vancomycin doses.

## 2020-02-01 NOTE — Progress Notes (Signed)
[place] PHYSICAL MEDICINE & REHABILITATION PROGRESS NOTE   Subjective/Complaints:   Pt reports LBM was yesterday. Medium brown stool yesterday afternoon.  C/O stump pain on R- wants it xray'd because didn't expect this much pain.   Explained it's common to have a lot of pain when leg is amputated.  Thinks the stents got "crossed".  Leg hurts medial aspect and is TTP over inner thigh, not on incision site.  Notes took tylenol last night instead of pain meds- nursing said pt "requested it'.     ROS: pt denies SOB, CP, abd pain, N/V/C/D, vision changes or headache   Objective:   No results found. No results for input(s): WBC, HGB, HCT, PLT in the last 72 hours. No results for input(s): NA, K, CL, CO2, GLUCOSE, BUN, CREATININE, CALCIUM in the last 72 hours.  Intake/Output Summary (Last 24 hours) at 02/01/2020 0832 Last data filed at 02/01/2020 0700 Gross per 24 hour  Intake 682 ml  Output 2100 ml  Net -1418 ml     Physical Exam: Vital Signs Blood pressure 139/65, pulse 60, temperature 97.6 F (36.4 C), resp. rate 18, SpO2 99 %.  Constitutional: He is oriented to person, place, and time.  Frail, thin appearing male.  Sitting up in bed; c/o about no sleep and R AKA pain., NAD HENT: Normocephalic and atraumatic Mouth/Throat: No oropharyngeal exudate.  Eyes:  Conjunctivae and EOM are normal.  Neck: No tracheal deviation present.  Cardiovascular: RRR  Respiratory: CTA B/L- no W/R/R  GI: slightly distended vs protuberant still ; NT, soft, (+) BS  Musculoskeletal:     Cervical back: Normal range of motion     Comments:  5-/5 in UEs- deltoids, biceps, triceps, WE, grip and finger abduction HF at least 3+/5 in LEs- pain on R side R AKA wrapped in ACE wrap L AKA healed with very little muscle/very thin  Neurological: He is oriented to person, place, and time. Mild dysmetria with left FTN, normal on right.  Sensation intact in UEs B/L and B/L AKAs. Increased sensitivity in  RLE esp inner thigh above ACE wrap- no wound or redness at TTP location.   Skin:  Right AKA is dressed appropriately tender.  Left AKA site well-healed with some dry skin.  2 IV's in L forearm- no signs of infiltration.   Psychiatric: In good spirits.    Assessment/Plan: 1. Functional deficits secondary new R AKA and recent L AKA as well as bacteremia on IV Vanc to which require 3+ hours per day of interdisciplinary therapy in a comprehensive inpatient rehab setting.  Physiatrist is providing close team supervision and 24 hour management of active medical problems listed below.  Physiatrist and rehab team continue to assess barriers to discharge/monitor patient progress toward functional and medical goals  Care Tool:  Bathing    Body parts bathed by patient: Right arm, Left arm, Chest, Abdomen, Face, Front perineal area, Buttocks, Left upper leg     Body parts n/a: Right upper leg, Right lower leg, Left lower leg   Bathing assist Assist Level: Supervision/Verbal cueing     Upper Body Dressing/Undressing Upper body dressing   What is the patient wearing?: Pull over shirt    Upper body assist Assist Level: Supervision/Verbal cueing    Lower Body Dressing/Undressing Lower body dressing      What is the patient wearing?: Incontinence brief, Pants     Lower body assist Assist for lower body dressing: Total Assistance - Patient < 25%  Toileting Toileting Toileting Activity did not occur Landscape architect and hygiene only): N/A (no void or bm)  Toileting assist Assist for toileting: Minimal Assistance - Patient > 75% Assistive Device Comment: urnial   Transfers Chair/bed transfer  Transfers assist  Chair/bed transfer activity did not occur: N/A  Chair/bed transfer assist level: Minimal Assistance - Patient > 75%     Locomotion Ambulation   Ambulation assist   Ambulation activity did not occur: N/A          Walk 10 feet activity   Assist  Walk  10 feet activity did not occur: N/A        Walk 50 feet activity   Assist Walk 50 feet with 2 turns activity did not occur: N/A         Walk 150 feet activity   Assist Walk 150 feet activity did not occur: N/A         Walk 10 feet on uneven surface  activity   Assist Walk 10 feet on uneven surfaces activity did not occur: N/A         Wheelchair     Assist Will patient use wheelchair at discharge?: Yes Type of Wheelchair: Manual    Wheelchair assist level: Independent Max wheelchair distance: 150'    Wheelchair 50 feet with 2 turns activity    Assist        Assist Level: Independent   Wheelchair 150 feet activity     Assist      Assist Level: Supervision/Verbal cueing   Blood pressure 139/65, pulse 60, temperature 97.6 F (36.4 C), resp. rate 18, SpO2 99 %.  Medical Problem List and Plan: 1.  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   1/30: Reviewed care team notes. Participated in [facility] mobility 150 feet yesterday, voiding on own. Supervision to [place] for self-care.  2.  Antithrombotics: -DVT/anticoagulation: Subcutaneous heparin             -antiplatelet therapy: Aspirin 81 mg daily 3. Pain Management: Neurontin 100 mg 3 times daily, oxycodone as needed  1/30: pain is well controlled.   1/31- new worsening pain in R AKA- if doesn't improve, will d/w vascular.  4. Mood: Wellbutrin 100 mg twice daily             -antipsychotic agents: N/A 5. Neuropsych: This patient is capable of making decisions on his own behalf. 6. Skin/Wound Care: Routine skin checks for new R AKA incision 7. Fluids/Electrolytes/Nutrition: Routine in and outs with  follow-up chemistries 8.  Staph aureus bacteremia.  Follow-up infectious disease.  Repeat blood cultures pending.  Continue vancomycin and discussed duration with infectious disease; pharmacy dosing Vanc  1/29- 10 more days of IV Vanc per ID- will order midline to be placed per ID request.  2/1- will recheck labs in AM 9.  Acute blood loss anemia.  Follow-up CBC  1/29- Hb 8.7 up from 8.0- doing better 10.  Vitamin B12 deficiency.  Continue supplement 11.  Hyperlipidemia.  Lipitor 12.  History of tobacco and alcohol use.  Provide counseling. 13. Constipation: 1/31: started senna-docusate BID  14. Insomnia: started 3mg  melatonin PRN bedtime with one additional dose prn if needed.  2/1- still slept poorly due to pain.   LOS: 4 days A FACE TO FACE EVALUATION WAS PERFORMED  Keshana Klemz 02/01/2020, 8:32 AM

## 2020-02-01 NOTE — Progress Notes (Signed)
Occupational Therapy Session Note  Patient Details  Name: Terrence Carr MRN: [id number] Date of Birth: April 29, 1961  Today's Date: 02/01/2020 OT Individual Time: 1335-1402 OT Individual Time Calculation (min): 27 min   Short Term Goals: Week 1:  OT Short Term Goal 1 (Week 1): STGs=LTGs due to ELOS  Skilled Therapeutic Interventions/Progress Updates:    Pt greeted in bed, agreeable to OT, declining toileting but willing to try AP toilet transfer for practice. Bed mobility completed with supervision assist and then he completed a posterior transfer to the w/c with CGA. CGA for anterior transfer to the toilet using grab bar on his Rt side. Pt reports having a grab bar on the Rt side of his toilet at home and that his bathroom is w/c accessible. Pt started simulated clothing mgt using lateral leans and reported increased pain in Rt residual limb, stating it was too painful to attempt simulated toileting tasks today. Posterior transfer off of toilet completed in method as stated above. He then self propelled w/c in the room to gather needed items for oral care and completed oral care while sitting at the sink. AP transfer<bed completed with CGA once again and pt transitioned to sidelying. Pt left in bed with 4 bedrails up per request, all needs within reach. Pt declined cryotherapy or rewrapping residual limb for pain relief, opting instead of medicinal interventions. Notified RN. Tx focus placed on OOB tolerance, functional transfers, and d/c planning.   Therapy Documentation Precautions:  Precautions Precautions: Fall Precaution Comments: Bil AKA Restrictions Weight Bearing Restrictions: Yes RLE Weight Bearing: Non weight bearing LLE Weight Bearing: Non weight bearing Vital Signs: Therapy Vitals Temp: 98.2 F (36.8 C) Pulse Rate: 66 Resp: 16 BP: (!) 119/58 Patient Position (if appropriate): Lying Oxygen Therapy SpO2: 94 % O2 Device: Room Air Pain: in Rt residual limb. RN aware of pts  request for pain medicine at end of session  Pain Assessment Pain Scale: 0-10 Pain Score: Asleep Pain Type: Surgical pain Pain Location: Knee Pain Orientation: Right Pain Descriptors / Indicators: Throbbing;Sore Pain Frequency: Constant Pain Onset: On-going Pain Intervention(s): Medication (See eMAR) ADL: ADL Grooming: Setup Where Assessed-Grooming: Edge of bed Upper Body Bathing: Setup Where Assessed-Upper Body Bathing: Edge of bed Lower Body Bathing: Supervision/safety Where Assessed-Lower Body Bathing: Edge of bed, Bed level Upper Body Dressing: Supervision/safety Where Assessed-Upper Body Dressing: Edge of bed Lower Body Dressing: Maximal assistance Where Assessed-Lower Body Dressing: Bed level Toileting: Not assessed Toilet Transfer: Not assessed Tub/Shower Transfer: Not assessed      Therapy/Group: Individual Therapy  Harli Engelken A Suzzane Quilter 02/01/2020, 3:36 PM

## 2020-02-01 NOTE — Progress Notes (Signed)
Occupational Therapy Session Note  Patient Details  Name: JAYDUN WORTMAN MRN: [id number] Date of Birth: 16-Apr-1961  Today's Date: 02/01/2020 OT Individual Time: 1100-1155 OT Individual Time Calculation (min): 55 min    Short Term Goals: Week 1:  OT Short Term Goal 1 (Week 1): STGs=LTGs due to ELOS  Skilled Therapeutic Interventions/Progress Updates:    Pt resting in bed upon arrival.  IV running.  Pt requested to remain in bed but agreeable to therapy.  OT intervention with focus on bed mobility (min A/mod A) for rolling and repositioning, discharge planning, and BUE therex with unweighted ball and 3# bar. Pt also demonstrated theraband exercises (shoulder flexion and adduction). Pt with LUE shoulder weakeness in open chain reaching tasks. Overall LUE strength 4-/5. Pt remained in bed with all needs within reah and bed alarm activated.  Therapy Documentation Precautions:  Precautions Precautions: Fall Precaution Comments: Bil AKA Restrictions Weight Bearing Restrictions: Yes RLE Weight Bearing: Non weight bearing LLE Weight Bearing: Non weight bearing   Pain: Pt states he didn't sleep previous night because of pain but states his RLE is "okay" this morning  Therapy/Group: Individual Therapy  Leroy Libman 02/01/2020, 12:08 PM

## 2020-02-01 NOTE — IPOC Note (Signed)
Overall Plan of Care [facility]) Patient Details Name: Terrence Carr MRN: [id number] DOB: 22-Jan-1961  Admitting Diagnosis: S/P bilateral above knee amputation [facility])  Hospital Problems: Principal Problem:   S/P bilateral above knee amputation ([place]) Active Problems:   Essential hypertension   Peripheral arterial disease ([place])   PAD (peripheral artery disease) ([place])   MSSA bacteremia     Functional Problem List: Nursing Bladder, Bowel, Pain, Skin Integrity  PT Balance, Endurance, Pain, Safety  OT Balance, Behavior, Cognition, Endurance, Motor, Pain, Safety, Skin Integrity  SLP    TR         Basic ADL's: OT Dressing, Toileting     Advanced  ADL's: OT Simple Meal Preparation     Transfers: PT Bed Mobility, Bed to Chair, Car, Floor, Furniture  OT Toilet     Locomotion: PT Ambulation, Emergency planning/management officer, Stairs     Additional Impairments: OT None  SLP        TR      Anticipated Outcomes Item Anticipated Outcome  Self Feeding No goal  Swallowing      Basic self-care  Supervision-Min A  Toileting  Min A   Bathroom Transfers Supervision  Bowel/Bladder  cont x 2, min assist  Transfers  Supervision  Locomotion  Supervision at w/c level  Communication     Cognition     Pain  less than 3 on 0-10 scale  Safety/Judgment  mod I    Therapy Plan: PT Intensity: Minimum of 1-2 x/day ,45 to 90 minutes PT Frequency: 5 out of 7 days PT Duration Estimated Length of Stay: 7-10 days OT Intensity: Minimum of 1-2 x/day, 45 to 90 minutes OT Frequency: 5 out of 7 days OT Duration/Estimated Length of Stay: 7-10 days     Due to the current state of emergency, patients may not be receiving their 3-hours of Medicare-mandated therapy.   Team Interventions: Nursing Interventions Patient/Family Education, Bladder Management, Bowel Management, Disease Management/Prevention, Skin Care/Wound Management  PT interventions Balance/vestibular training, Community reintegration,  Discharge planning, Disease management/prevention, DME/adaptive equipment instruction, Functional mobility training, Neuromuscular re-education, Pain management, Patient/family education, Psychosocial support, Therapeutic Activities, Therapeutic Exercise, UE/LE Strength taining/ROM, UE/LE Coordination activities, Wheelchair propulsion/positioning  OT Interventions Training and development officer, DME/adaptive equipment instruction, Patient/family education, Therapeutic Activities, Wheelchair propulsion/positioning, Cognitive remediation/compensation, Psychosocial support, Therapeutic Exercise, UE/LE Strength taining/ROM, Self Care/advanced ADL retraining, Functional mobility training, Discharge planning, Neuromuscular re-education, Community reintegration, Skin care/wound managment, UE/LE Coordination activities, Disease mangement/prevention, Pain management  SLP Interventions    TR Interventions    SW/CM Interventions Discharge Planning, Psychosocial Support, Patient/Family Education   Barriers to Discharge MD  Medical stability, Weight, Weight bearing restrictions and Behavior  Nursing      PT Medical stability, Incontinence, Lack of/limited family support, Weight bearing restrictions    OT Wound Care, Medical stability, Behavior Pt can exhibit self limiting behaviors which impacts participation  SLP      SW       Team Discharge Planning: Destination: PT-Home ,OT- Home , SLP-  Projected Follow-up: PT-Home health PT, OT-  Home health OT, SLP-  Projected Equipment Needs: PT-Other (comment)(already owns w/c from previous rehab stay), OT- To be determined, SLP-  Equipment Details: PT- , OT-  Patient/family involved in discharge planning: PT- Patient,  OT-Patient, SLP-   MD ELOS: 7-10 days Medical Rehab Prognosis:  Good Assessment: Pt is a 59 yr old male with new R AKA and L AKA in 12/20 after fem pop bypass for R in 12/02/29/20.  Staph aureus bacteremia-  on IV Vanc- dosing per  pharmacy.   Goals Supervision to min assist      See Team Conference Notes for weekly updates to the plan of care

## 2020-02-01 NOTE — Progress Notes (Signed)
Patients c/o pain to incision on inner right thigh. Incision warm to touch and tender, not red. PRN tylenol given, along with icing site.

## 2020-02-01 NOTE — Progress Notes (Signed)
Peripherally Inserted Central Catheter/Midline Placement  The IV Nurse has discussed with the patient and/or persons authorized to consent for the patient, the purpose of this procedure and the potential benefits and risks involved with this procedure.  The benefits include less needle sticks, lab draws from the catheter, and the patient may be discharged home with the catheter. Risks include, but not limited to, infection, bleeding, blood clot (thrombus formation), and puncture of an artery; nerve damage and irregular heartbeat and possibility to perform a PICC exchange if needed/ordered by physician.  Alternatives to this procedure were also discussed.  Bard Power PICC patient education guide, fact sheet on infection prevention and patient information card has been provided to patient /or left at bedside.    PICC/Midline Placement Documentation  PICC Single Lumen 0000000 PICC Right Basilic 40 cm 0 cm (Active)  Indication for Insertion or Continuance of Line Home intravenous therapies (PICC only) 02/01/20 2147  Exposed Catheter (cm) 0 cm 02/01/20 2147  Site Assessment Clean;Dry;Intact 02/01/20 2147  Line Status Flushed;Saline locked;Blood return noted 02/01/20 2147  Dressing Type Transparent 02/01/20 2147  Dressing Status Clean;Dry;Intact;Antimicrobial disc in place 02/01/20 2147  Dressing Change Due 02/08/20 02/01/20 2147       Gordan Payment 02/01/2020, 9:49 PM

## 2020-02-01 NOTE — Progress Notes (Signed)
Physical Therapy Session Note  Patient Details  Name: Terrence Carr MRN: [id number] Date of Birth: 29-Jul-1961  Today's Date: 02/01/2020 PT Individual Time: 0900-1000 PT Individual Time Calculation (min): 60 min   Short Term Goals: Week 1:  PT Short Term Goal 1 (Week 1): =LTG due to ELOS  Skilled Therapeutic Interventions/Progress Updates:    Pt received seated in bed, reports increase in pain in RLE this AM and reports not sleeping well. Per pt he has already received pain medication, use of distraction techniques for pain management during session. With encouragement pt agreeable to participate in therapy session. Pt requesting to don new clothes. Pt is max A to doff pants and soiled brief via rolling L/R with Supervision. Supine to sit with Supervision with increased time and use of bedrail. Pt is setup A to doff shirt while seated EOB. Pt is setup A for UB, LB, and periarea bathing at bed level while seated EOB and in supine. Pt is max A to don new brief and pants, setup A to don new shirt. Assisted pt with donning new ACE wrap to R residual limb. Reviewed wrapping technique and discussed purpose of ACE wrap for edema management and limb shaping. Pt left seated in bed with needs in reach, bed alarm in place at end of session.  Therapy Documentation Precautions:  Precautions Precautions: Fall Precaution Comments: Bil AKA Restrictions Weight Bearing Restrictions: Yes RLE Weight Bearing: Non weight bearing LLE Weight Bearing: Non weight bearing    Therapy/Group: Individual Therapy   Excell Seltzer, PT, DPT  02/01/2020, 12:43 PM

## 2020-02-01 NOTE — Progress Notes (Signed)
Occupational Therapy Session Note  Patient Details  Name: Terrence Carr MRN: [id number] Date of Birth: 08-Oct-1961  Today's Date: 02/01/2020 OT Individual Time: 1534-1600 OT Individual Time Calculation (min): 26 min    Short Term Goals: Week 1:  OT Short Term Goal 1 (Week 1): STGs=LTGs due to ELOS  Skilled Therapeutic Interventions/Progress Updates:    Upon entering the room, pt supine in bed with 8/10 c/o pain in R residual limb and described it as spasms. Pt's BP is 132/68 this session with pulse of 67. Pt declined OOB activities this session and was very verbose in discussing his feeling about having second surgery. Pt also with several questions regarding prosthetic and OT educated pt on time frame and future therapeutic interventions to address this. Pt performing bed mobility with supervision overall and able to roll self into prone position while in bed for stretching for 5 minutes. Pt able to roll back into supine with supervision and min cuing for technique. Pt remains in bed with call bell and all needed items within reach. Bed alarm activated.   Therapy Documentation Precautions:  Precautions Precautions: Fall Precaution Comments: Bil AKA Restrictions Weight Bearing Restrictions: Yes RLE Weight Bearing: Non weight bearing LLE Weight Bearing: Non weight bearing General:   Vital Signs: Therapy Vitals Temp: 98.2 F (36.8 C) Pulse Rate: 66 Resp: 16 BP: (!) 119/58 Patient Position (if appropriate): Lying Oxygen Therapy SpO2: 94 % O2 Device: Room Air Pain: Pain Assessment Pain Scale: 0-10 Pain Score: Asleep Pain Type: Surgical pain Pain Location: Knee Pain Orientation: Right Pain Descriptors / Indicators: Throbbing;Sore Pain Frequency: Constant Pain Onset: On-going Pain Intervention(s): Medication (See eMAR) ADL: ADL Grooming: Setup Where Assessed-Grooming: Edge of bed Upper Body Bathing: Setup Where Assessed-Upper Body Bathing: Edge of bed Lower Body  Bathing: Supervision/safety Where Assessed-Lower Body Bathing: Edge of bed, Bed level Upper Body Dressing: Supervision/safety Where Assessed-Upper Body Dressing: Edge of bed Lower Body Dressing: Maximal assistance Where Assessed-Lower Body Dressing: Bed level Toileting: Not assessed Toilet Transfer: Not assessed Tub/Shower Transfer: Not assessed   Therapy/Group: Individual Therapy  [name] 02/01/2020, 4:15 PM

## 2020-02-02 ENCOUNTER — Inpatient Hospital Stay (HOSPITAL_COMMUNITY): Payer: Medicaid Other

## 2020-02-02 ENCOUNTER — Inpatient Hospital Stay (HOSPITAL_COMMUNITY): Payer: Medicaid Other | Admitting: Physical Therapy

## 2020-02-02 LAB — COMPREHENSIVE METABOLIC PANEL
ALT: 15 U/L (ref 0–44)
AST: 14 U/L — ABNORMAL LOW (ref 15–41)
Albumin: 2 g/dL — ABNORMAL LOW (ref 3.5–5.0)
Alkaline Phosphatase: 101 U/L (ref 38–126)
Anion gap: 10 (ref 5–15)
BUN: 12 mg/dL (ref 6–20)
CO2: 26 mmol/L (ref 22–32)
Calcium: 9 mg/dL (ref 8.9–10.3)
Chloride: 98 mmol/L (ref 98–111)
Creatinine, Ser: 0.76 mg/dL (ref 0.61–1.24)
GFR calc Af Amer: >60 mL/min (ref >60)
GFR calc non Af Amer: >60 mL/min (ref >60)
Glucose, Bld: 106 mg/dL — ABNORMAL HIGH (ref 70–99)
Potassium: 4.3 mmol/L (ref 3.5–5.1)
Sodium: 134 mmol/L — ABNORMAL LOW (ref 135–145)
Total Bilirubin: 0.3 mg/dL (ref 0.3–1.2)
Total Protein: 6.8 g/dL (ref 6.5–8.1)

## 2020-02-02 LAB — CBC WITH DIFFERENTIAL/PLATELET
Abs Immature Granulocytes: 0.12 10*3/uL — ABNORMAL HIGH (ref 0.00–0.07)
Basophils Absolute: 0.1 10*3/uL (ref 0.0–0.1)
Basophils Relative: 1 %
Eosinophils Absolute: 0.3 10*3/uL (ref 0.0–0.5)
Eosinophils Relative: 4 %
HCT: 28 % — ABNORMAL LOW (ref 39.0–52.0)
Hemoglobin: 8.5 g/dL — ABNORMAL LOW (ref 13.0–17.0)
Immature Granulocytes: 2 %
Lymphocytes Relative: 22 %
Lymphs Abs: 1.6 10*3/uL (ref 0.7–4.0)
MCH: 29.2 pg (ref 26.0–34.0)
MCHC: 30.4 g/dL (ref 30.0–36.0)
MCV: 96.2 fL (ref 80.0–100.0)
Monocytes Absolute: 1 10*3/uL (ref 0.1–1.0)
Monocytes Relative: 14 %
Neutro Abs: 4.3 10*3/uL (ref 1.7–7.7)
Neutrophils Relative %: 57 %
Platelets: 837 10*3/uL — ABNORMAL HIGH (ref 150–400)
RBC: 2.91 MIL/uL — ABNORMAL LOW (ref 4.22–5.81)
RDW: 16.7 % — ABNORMAL HIGH (ref 11.5–15.5)
WBC: 7.5 10*3/uL (ref 4.0–10.5)
nRBC: 0 % (ref 0.0–0.2)

## 2020-02-02 LAB — VANCOMYCIN, PEAK: Vancomycin Pk: 22 ug/mL — ABNORMAL LOW (ref 30–40)

## 2020-02-02 LAB — VANCOMYCIN, TROUGH: Vancomycin Tr: 11 ug/mL — ABNORMAL LOW (ref 15–20)

## 2020-02-02 MED ORDER — HYDROCERIN EX CREA
TOPICAL_CREAM | Freq: Two times a day (BID) | CUTANEOUS | Status: DC
  Administered 2020-02-08: 1 via TOPICAL
  Filled 2020-02-02: qty 113

## 2020-02-02 MED ORDER — TRAMADOL HCL 50 MG PO TABS
50.0000 mg | ORAL_TABLET | Freq: Four times a day (QID) | ORAL | Status: DC | PRN
  Administered 2020-02-02 – 2020-02-07 (×4): 50 mg via ORAL
  Filled 2020-02-02 (×4): qty 1

## 2020-02-02 MED ORDER — CHLORHEXIDINE GLUCONATE CLOTH 2 % EX PADS
6.0000 | MEDICATED_PAD | Freq: Every day | CUTANEOUS | Status: DC
  Administered 2020-02-02 – 2020-02-08 (×7): 6 via TOPICAL

## 2020-02-02 MED ORDER — TRAZODONE HCL 50 MG PO TABS
50.0000 mg | ORAL_TABLET | Freq: Every day | ORAL | Status: DC
  Administered 2020-02-02 – 2020-02-07 (×6): 50 mg via ORAL
  Filled 2020-02-02 (×6): qty 1

## 2020-02-02 NOTE — Progress Notes (Signed)
Physical Therapy Session Note  Patient Details  Name: Terrence Carr MRN: [id number] Date of Birth: 11-May-1961  Today's Date: 02/02/2020 PT Individual Time: 1000-1045 PT Individual Time Calculation (min): 45 min    Short Term Goals: Week 1:  PT Short Term Goal 1 (Week 1): =LTG due to ELOS  Skilled Therapeutic Interventions/Progress Updates:    Patient supine in bed upon PT arrival, agreeable to PT tx, reports 7/10 pain R anterior/distal femur. RN in/out to assess IV. Min assist for A/P transfer from bed > w/c for scooting hips over threshold. Pt performed w/c propulsion from room to rehab gym 150' + 200' to staff room to make cup of coffee at pt's request, multiple turns negotiating furniture and change in directions down various hallways, with supervision. A/P transfer from w/c to mat with reciprocal hip scooting, min assist to clear threshold. Sit>supine with supervision, therapist applied gentle pressure to LLE for hip flexor stretch, repeated on RLE. Supine> roll to L sidelying with CGA, pt performed RLE contract-relax hip extension with 5s hold x10 reps and hip abduction x10 reps, cues for sequencing & manual assist to keep residual limb in neutral position to prevent adduction and significant hip flexion. L sidelying>prone with minA for hip positioning, pt performed hip extension x3 reps bilaterally, greater difficulty with RLE needing assist for greater ROM however limited d/t significant hip flexor tightness. Transported pt back to room for time management at RN's request for timed lab draw. Pt left in room seated in w/c with needs in reach and chair alarm set.    Therapy Documentation Precautions:  Precautions Precautions: Fall Precaution Comments: Bil AKA Restrictions Weight Bearing Restrictions: Yes RLE Weight Bearing: Non weight bearing LLE Weight Bearing: Non weight bearing   Therapy/Group: Individual Therapy  Juliann Pulse SPT 02/02/2020, 7:45 AM

## 2020-02-02 NOTE — Progress Notes (Addendum)
Pharmacy Antibiotic Note  Terrence Carr is a 59 y.o. male admitted on 01/28/2020 with right foot/leg gangrene. S/P AKA of right leg yesterday, now Quadriplegic. Now with staph aureus in 1/2 blood cultures.  Pharmacy has been consulted for vancomycin dosing.  Noted that patient has lower body mass due to bilateral AKAs.   Vancomycin levels demonstrate therapeutic AUC, Cr remains stable.   Plan: -Continue Vancomycin 500 mg every 12 hours -Monitor renal function, levels as appropriate   Temp (24hrs), Avg:98.2 F (36.8 C), Min:97.7 F (36.5 C), Max:98.9 F (37.2 C)  Recent Labs  Lab 01/27/20 0252 01/28/20 0342 01/29/20 0545 02/02/20 0335 02/02/20 1035 02/02/20 2059  WBC 12.1* 9.1 6.5 7.5  --   --   CREATININE 0.79 0.88 0.72 0.76  --   --   VANCOTROUGH  --   --   --   --   --  11*  VANCOPEAK  --   --   --   --  22*  --     Estimated Creatinine Clearance: 75 mL/min (by C-G formula based on SCr of 0.76 mg/dL).    Allergies  Allergen Reactions  . Ibuprofen Other (See Comments)    Patient said "the little brown pills" that he bought from the store made him talk out of his head (reported by [facility])  . Other Other (See Comments)    Unknown reaction to "Dihydroxyalaminum aminoacetate magnesium carbonate" (reported by [facility]   Antibiotics: Vanc 1/27 >> (2/10) - stop date entered  Microbiology: 1/25 Bcx: 1/2 staph aureus  1/26 MRSA pcr + 1/25 covid/flu neg  1/27 bld x2: ngtd  Arrie Senate, PharmD, BCPS Clinical Pharmacist [phone] Please check AMION for all [facility] numbers 02/02/2020

## 2020-02-02 NOTE — Progress Notes (Signed)
Occupational Therapy Session Note  Patient Details  Name: Terrence Carr MRN: [id number] Date of Birth: [birth year]  Today's Date: 02/02/2020 OT Individual Time: 1300-1325 OT Individual Time Calculation (min): 25 min    Short Term Goals: Week 1:  OT Short Term Goal 1 (Week 1): STGs=LTGs due to ELOS  Skilled Therapeutic Interventions/Progress Updates:    Pt sitting in w/c upon arrival. OT intervention with focus on LUE function to assist with BADLs and A/P and slide board transfers. Pt engaged in table tasks including retrieving items and placing them on surface above 90 degrees.  Pt requires HOH assistance to complete tasks.  Pt requires less assistance with repetition of tasks. Pt remained seated in w/c with belt alarm activated and all needs within reach.   Therapy Documentation Precautions:  Precautions Precautions: Fall Precaution Comments: Bil AKA Restrictions Weight Bearing Restrictions: Yes RLE Weight Bearing: Non weight bearing LLE Weight Bearing: Non weight bearing Pain:  Pt denies pain this afternoon   Therapy/Group: Individual Therapy  Leroy Libman 02/02/2020, 2:34 PM

## 2020-02-02 NOTE — Progress Notes (Signed)
[facility] PHYSICAL MEDICINE & REHABILITATION PROGRESS NOTE   Subjective/Complaints:  Pt reports feeling better than yesterday this AM. Tylenol works better than Oxy and would rather try something else that might work, if possible.   Also, R leg itching "like mad" and wants to scratch it.  Pain overall doing a little better; still didn't sleep last night with melatonin.   ROS: pt denies SOB, CP, abd pain, N/V/C/D, vision changes or headache   Objective:   [place] EKG SITE RITE  Result Date: 02/01/2020 If Site Rite image not attached, placement could not be confirmed due to current cardiac rhythm.  Recent Labs    02/02/20 0335  WBC 7.5  HGB 8.5*  HCT 28.0*  PLT 837*   Recent Labs    02/02/20 0335  NA 134*  K 4.3  CL 98  CO2 26  GLUCOSE 106*  BUN 12  CREATININE 0.76  CALCIUM 9.0    Intake/Output Summary (Last 24 hours) at 02/02/2020 0930 Last data filed at 02/02/2020 0700 Gross per 24 hour  Intake 440 ml  Output 1625 ml  Net -1185 ml     Physical Exam: Vital Signs Blood pressure (!) 149/72, pulse (!) 57, temperature 98 F (36.7 C), temperature source Oral, resp. rate 18, SpO2 97 %.  Constitutional: He is oriented to person, place, and time.  Frail, thin appearing male.  Sitting up in bed; brighter affect; OT in room working with pt, NAD HENT: Normocephalic and atraumatic Mouth/Throat: No oropharyngeal exudate.  Eyes:  Conjunctivae and EOM are normal.  Neck: No tracheal deviation present.  Cardiovascular: RRR  Respiratory: CTA B/L- no W/R/R  GI:  protuberant still ; NT, soft, (+) BS  Musculoskeletal:     Cervical back: Normal range of motion     Comments:  5-/5 in UEs- deltoids, biceps, triceps, WE, grip and finger abduction HF at least 3+/5 in LEs- pain on R side R AKA wrapped in ACE wrap- seen- no drainage, no erythema ; mild swelling; looks great; has some dry skin on AKA L AKA healed with very little muscle/very thin  Neurological: He is oriented to  person, place, and time. Mild dysmetria with left FTN, normal on right.  Sensation intact in UEs B/L and B/L AKAs. Increased sensitivity in RLE esp inner thigh above ACE wrap- no wound or redness at TTP location.   Skin:  Right AKA is dressed appropriately tender.  Left AKA site well-healed with some dry skin.  Has new PICC line in RUE   Psychiatric: In good spirits.    Assessment/Plan: 1. Functional deficits secondary new R AKA and recent L AKA as well as bacteremia on IV Vanc to which require 3+ hours per day of interdisciplinary therapy in a comprehensive inpatient rehab setting.  Physiatrist is providing close team supervision and 24 hour management of active medical problems listed below.  Physiatrist and rehab team continue to assess barriers to discharge/monitor patient progress toward functional and medical goals  Care Tool:  Bathing    Body parts bathed by patient: Right arm, Left arm, Chest, Abdomen, Face, Front perineal area, Buttocks, Left upper leg, Right upper leg     Body parts n/a: Right lower leg, Left lower leg   Bathing assist Assist Level: Set up assist     Upper Body Dressing/Undressing Upper body dressing   What is the patient wearing?: Pull over shirt    Upper body assist Assist Level: Set up assist    Lower Body Dressing/Undressing Lower  body dressing      What is the patient wearing?: Pants     Lower body assist Assist for lower body dressing: Contact Guard/Touching assist     Toileting Toileting Toileting Activity did not occur (Clothing management and hygiene only): N/A (no void or bm)  Toileting assist Assist for toileting: Minimal Assistance - Patient > 75% Assistive Device Comment: urnial   Transfers Chair/bed transfer  Transfers assist     Chair/bed transfer assist level: Minimal Assistance - Patient > 75%     Locomotion Ambulation   Ambulation assist   Ambulation activity did not occur: N/A          Walk 10 feet  activity   Assist  Walk 10 feet activity did not occur: N/A        Walk 50 feet activity   Assist Walk 50 feet with 2 turns activity did not occur: N/A         Walk 150 feet activity   Assist Walk 150 feet activity did not occur: N/A         Walk 10 feet on uneven surface  activity   Assist Walk 10 feet on uneven surfaces activity did not occur: N/A         Wheelchair     Assist Will patient use wheelchair at discharge?: Yes Type of Wheelchair: Manual    Wheelchair assist level: Independent Max wheelchair distance: 150'    Wheelchair 50 feet with 2 turns activity    Assist        Assist Level: Independent   Wheelchair 150 feet activity     Assist      Assist Level: Supervision/Verbal cueing   Blood pressure (!) 149/72, pulse (!) 57, temperature 98 F (36.7 C), temperature source Oral, resp. rate 18, SpO2 97 %.  Medical Problem List and Plan: 1.  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   1/30: Reviewed care team notes. Participated in [facility] mobility 150 feet yesterday, voiding on own. Supervision to [place] for self-care.  2/2- dry skin- started eucerin BID for dry AKA  2.  Antithrombotics: -DVT/anticoagulation: Subcutaneous heparin             -antiplatelet therapy: Aspirin 81 mg daily 3. Pain Management: Neurontin 100 mg 3 times daily, oxycodone as needed  1/30: pain is well controlled.   1/31- new worsening pain in R AKA- if doesn't improve, will d/w vascular.  2/2- will d/c oxy- not helpful per pt and start Tramadol 50 mg q6 hours prn.   4. Mood: Wellbutrin 100 mg twice daily             -antipsychotic agents: N/A 5. Neuropsych:  This patient is capable of making decisions on his own behalf. 6. Skin/Wound Care: Routine skin checks for new R AKA incision 7. Fluids/Electrolytes/Nutrition: Routine in and outs with follow-up chemistries 8.  Staph aureus bacteremia.  Follow-up infectious disease.  Repeat blood cultures pending.  Continue vancomycin and discussed duration with infectious disease; pharmacy dosing Vanc  1/29- 10 more days of IV Vanc per ID- will order midline to  be placed per ID request.  2/1- will recheck labs in AM  2/2- Cr/BUN/ great and WBC 7.5- con't Vanc- got PICC line per pharmacy team.  9.  Acute blood loss anemia.  Follow-up CBC  1/29- Hb 8.7 up from 8.0- doing better 10.  Vitamin B12 deficiency.  Continue supplement 11.  Hyperlipidemia.  Lipitor 12.  History of tobacco and alcohol use.  Provide counseling. 13. Constipation: 1/31: started senna-docusate BID 14. Insomnia: started 3mg  melatonin PRN bedtime with one additional dose prn if needed.  2/1- still slept poorly due to pain.  2/2- added trazodone 50 mg QHS.    LOS: 5 days A FACE TO FACE EVALUATION WAS PERFORMED  Terrence Carr 02/02/2020, 9:30 AM

## 2020-02-02 NOTE — Patient Care Conference (Signed)
Inpatient RehabilitationTeam Conference and Plan of Care Update Date: 02/02/2020   Time: 11:00 AM   Patient Name: Terrence Carr      Medical Record Number: [id number]  Date of Birth: [birth year] Sex: Male         Room/Bed: 4W10C/4W10C-01 Payor Info: Payor: MEDICARE / Plan: MEDICARE PART A AND B / Product Type: *No Product type* /    Admit Date/Time:  01/28/2020  2:43 PM  Primary Diagnosis:  S/P bilateral above knee amputation [facility])  Patient Active Problem List   Diagnosis Date Noted  . S/P bilateral above knee amputation ([place]) 01/28/2020  . MSSA bacteremia 01/27/2020  . Gangrene ([place]) 01/25/2020  . PAD (peripheral artery disease) ([place]) 01/25/2020  . Pressure ulcer of left buttock, stage 2 ([place]) 01/01/2020  . Left above-knee amputee ([place]) 12/31/2019  . S/P AKA (above knee amputation) unilateral, left ([place])   . Osteomyelitis of multiple sites ([place])   . Thrombocytosis ([place]) 12/24/2019  . Hypoglycemia 12/24/2019  . Osteomyelitis of ankle or foot, acute, right ([place]) 12/23/2019  . Lactic acidosis 12/23/2019  . Alcohol abuse 12/23/2019  . Alcohol withdrawal ([place]) 12/23/2019  . Macrocytic anemia 12/23/2019  . Sacral decubitus ulcer 12/23/2019  . Hyperglycemia 12/23/2019  . Essential hypertension 12/23/2019  . Peripheral arterial disease ([place]) 12/23/2019  . Vitamin B12 deficiency 12/23/2019    Expected Discharge Date: Expected Discharge Date: 02/08/20  Team Members Present: Physician leading conference: Dr. Courtney Heys Social Worker Present: Lennart Pall, [place], [place]) Nurse Present: Isla Pence, RN Case Manager: Karene Fry, RN PT Present: Excell Seltzer, PT OT Present: Roanna Epley, [place], OT SLP Present: Stormy Fabian, SLP PPS Coordinator present : Gunnar Fusi, Novella Olive, PT     Current Status/Progress Goal Weekly Team Focus  Bowel/Bladder   Pt is mostly continent of bowel and bladder, Occasionally having episodes on incontinence of  bladder, LBM 02/01/20  Decrease episodes of incontinence  Toileting every 2-4 hours/PRN   Swallow/Nutrition/ Hydration             ADL's   UB dressing-setup; LB dressing-max A; bathing-supervisoin at bed level; transfers-min A for A/P tranfsers  LB dressing at bed level-min A; toileting-min A; toilet transfers-supervision  family educaiotn, functional transfers, activity tolerance   Mobility   Supervision bed mobility, min A A/P transfers, Supervision to mod I w/c mobility  Supervision assist at [facility] level  transfers, amputee edu, strengthening/ROM, activity tolerance   Communication             Safety/Cognition/ Behavioral Observations            Pain   Patient rates pain to Right AKA 8-5 out of 10  Pain level 3 or less  Assess pain every shift/ PRN & Provide PRN pain meds   Skin   Stage 2 pressure inury to buttocks  Prevent further breakdown of skin, no s/s of infection  Assess skin every shift, Keep area clean and dry      *See Care Plan and progress notes for long and short-term goals.     Barriers to Discharge  Current Status/Progress Possible Resolutions Date Resolved   Nursing                  PT  Medical stability;Incontinence;Weight bearing restrictions                 OT                  SLP  SW                Discharge Planning/Teaching Needs:  Home with girlfriend, however, step daughter and granddaughter to resume providing 24/7 assistance.  Teaching needs TBD   Team Discussion: R AKA, looks good, tramadol ordered, eucerin for dry/itching, meds adjusted.  RN cont, tramadol/tylenol for pain, PICC line in, IV abx until 2/8.  OT goals LB Dressing and toileting min A, currently S LB D, bath S, transfers CGA/min A, is cooperative.  PT S bed, CGA/min A AP transfers w/c, min A slide board in/out of car, S goals.  Has wife, fam ed 1 day before discharge.   Revisions to Treatment Plan: N/A     Medical Summary Current Status: continent of B/B; Weekly  Focus/Goal: PT_ superviison for bed; CGA_min assist for transfers; min assist SB transfers; I in w/c  Barriers to Discharge: Behavior;Decreased family/caregiver support;Wound care;Home enviroment access/layout;Weight bearing restrictions  Barriers to Discharge Comments: n/a Possible Resolutions to Barriers: toileting and LB dressing goals- did much better- supervision at bed level;   Continued Need for Acute Rehabilitation Level of Care: The patient requires daily medical management by a physician with specialized training in physical medicine and rehabilitation for the following reasons: Direction of a multidisciplinary physical rehabilitation program to maximize functional independence : Yes Medical management of patient stability for increased activity during participation in an intensive rehabilitation regime.: Yes Analysis of laboratory values and/or radiology reports with any subsequent need for medication adjustment and/or medical intervention. : Yes   I attest that I was present, lead the team conference, and concur with the assessment and plan of the team.   Jodell Cipro M 02/02/2020, 7:51 PM   Team conference was held via web/ teleconference due to [place] - 19

## 2020-02-02 NOTE — Progress Notes (Signed)
[facility]  Subjective  - Doing well with on/off complaints of right stump cramping.   Objective (!) 149/72 (!) 57 98 F (36.7 C) (Oral) 18 97%  Intake/Output Summary (Last 24 hours) at 02/02/2020 1141 Last data filed at 02/02/2020 0700 Gross per 24 hour  Intake 440 ml  Output 1425 ml  Net -985 ml    Right stump well healing without erythema, edema or drainage.  Soft to palpation.  Retained staples. Left stump completely healed Lungs non labored breathing  Assessment/Planning: Left AKA 12/24/19 Right AKA 01/26/20  Continue activity as tolerates in CIR F/U with Dr. Donzetta Matters for staple removal right AKA around 02/26/20 Viable B AKA  Terrence Carr 02/02/2020 11:41 AM --  Laboratory Lab Results: Recent Labs    02/02/20 0335  WBC 7.5  HGB 8.5*  HCT 28.0*  PLT 837*   BMET Recent Labs    02/02/20 0335  NA 134*  K 4.3  CL 98  CO2 26  GLUCOSE 106*  BUN 12  CREATININE 0.76  CALCIUM 9.0    COAG Lab Results  Component Value Date   INR 1.1 01/25/2020   INR 0.9 12/30/2019   INR 1.0 12/28/2019   No results found for: PTT

## 2020-02-02 NOTE — Progress Notes (Signed)
Occupational Therapy Session Note  Patient Details  Name: Terrence Carr MRN: [id number] Date of Birth: 1961/12/20  Today's Date: 02/02/2020 OT Individual Time: [id number] OT Individual Time Calculation (min): 55 min    Short Term Goals: Week 1:  OT Short Term Goal 1 (Week 1): STGs=LTGs due to ELOS  Skilled Therapeutic Interventions/Progress Updates:    OT intervention with focus on bed mobility, LB dressing, and LUE funcitonal use.  Pt states his LUE has "been weak" every since his previous amputation. PROM WFL in all planes. Pt requires HOH assist for shoulder flexion greater then 90 degrees. Pt engaged in reaching tasks to increase functional use of LUE. Pt also engaged in rolling in bed and LB dressing tasks. Pt supervision/CGA for LB dressing tasks. Discussed DME requirements and informed pt that since Medicare/Medicaid provided earlier [facility] he would not be able to get another one unless he paid for it.  During discussion pt stated that his wife had a BSC that he could use. Pt remained in bed with all needs within reach and bed alarm activated.   Therapy Documentation Precautions:  Precautions Precautions: Fall Precaution Comments: Bil AKA Restrictions Weight Bearing Restrictions: Yes RLE Weight Bearing: Non weight bearing LLE Weight Bearing: Non weight bearing Pain: Pt denies pain this morning   Therapy/Group: Individual Therapy  Leroy Libman 02/02/2020, 9:15 AM

## 2020-02-02 NOTE — Progress Notes (Addendum)
Physical Therapy Session Note  Patient Details  Name: Terrence Carr MRN: [id number] Date of Birth: 1961/08/17  Today's Date: 02/02/2020 PT Individual Time: 1430-1530 PT Individual Time Calculation (min): 60 min  PT Missed Time: 15 min Missed Time Reason: RN care  Short Term Goals: Week 1:  PT Short Term Goal 1 (Week 1): =LTG due to ELOS  Skilled Therapeutic Interventions/Progress Updates:    Pt missed 15 min at beginning of therapy session for RN care. Once done with RN care pt received seated in bed, agreeable to PT session. No complaints of pain. Bed mobility Supervision. A/P transfer bed to w/c with min A. Manual w/c propulsion x 200 ft with use of BUE at Supervision to mod I level. A/P transfer w/c to/from mat table with min A for trunk control. Session focus on sitting balance on mat performing ball roll and progressing to ball toss with SBA for balance. Sit to supine to prone with Supervision. Prone hip flexor stretch x 10 min. Discussed residual limb positioning and importance of prone positioning to prevent contracture. Returned to supine at Supervision level. Supine BLE strengthening therex: hip flex, hip abd x 10 reps each. A/P transfer back to w/c with min A. Pt left seated in w/c in room with needs in reach, quick release belt and chair alarm in place at end of session.  Therapy Documentation Precautions:  Precautions Precautions: Fall Precaution Comments: Bil AKA Restrictions Weight Bearing Restrictions: Yes RLE Weight Bearing: Non weight bearing LLE Weight Bearing: Non weight bearing    Therapy/Group: Individual Therapy   Excell Seltzer, PT, DPT  02/02/2020, 4:06 PM

## 2020-02-03 ENCOUNTER — Inpatient Hospital Stay (HOSPITAL_COMMUNITY): Payer: Medicare Other

## 2020-02-03 ENCOUNTER — Encounter (HOSPITAL_COMMUNITY): Payer: Self-pay | Admitting: Physical Medicine and Rehabilitation

## 2020-02-03 ENCOUNTER — Inpatient Hospital Stay (HOSPITAL_COMMUNITY): Payer: Medicare Other | Admitting: Physical Therapy

## 2020-02-03 DIAGNOSIS — I10 Essential (primary) hypertension: Secondary | ICD-10-CM

## 2020-02-03 NOTE — Progress Notes (Signed)
Occupational Therapy Session Note  Patient Details  Name: Terrence Carr MRN: [id number] Date of Birth: [birth year]  Today's Date: 02/03/2020 OT Individual Time: [id number] OT Individual Time Calculation (min): 55 min    Short Term Goals: Week 1:  OT Short Term Goal 1 (Week 1): STGs=LTGs due to ELOS  Skilled Therapeutic Interventions/Progress Updates:    OT intervention with focus on w/c mobility and BUE therex to increase independence with BADLs.  W/c mobility with S and more than a reasonabel amount of time.  BUE therex included chest presses with 3# bar (3X8), overhead presses with 1kg ball (3X8), and diagonals with 1kg ball (3X8X2). Pt remained in w/c with all needs within reach and belt alarm activated.   Therapy Documentation Precautions:  Precautions Precautions: Fall Precaution Comments: Bil AKA Restrictions Weight Bearing Restrictions: Yes RLE Weight Bearing: Non weight bearing LLE Weight Bearing: Non weight bearing General:   Vital Signs:   Pain: Pain Assessment Pain Scale: 0-10 Pain Score: 4  Faces Pain Scale: Hurts a little bit Pain Type: Surgical pain Pain Location: Leg Pain Orientation: Right Pain Descriptors / Indicators: Cramping Pain Frequency: Intermittent Pain Onset: On-going Pain Intervention(s): Repositioned;Emotional support ADL: ADL Grooming: Setup Where Assessed-Grooming: Edge of bed Upper Body Bathing: Setup Where Assessed-Upper Body Bathing: Edge of bed Lower Body Bathing: Supervision/safety Where Assessed-Lower Body Bathing: Edge of bed, Bed level Upper Body Dressing: Supervision/safety Where Assessed-Upper Body Dressing: Edge of bed Lower Body Dressing: Maximal assistance Where Assessed-Lower Body Dressing: Bed level Toileting: Not assessed Toilet Transfer: Not assessed Tub/Shower Transfer: Not assessed Vision   Perception    Praxis   Exercises:   Other Treatments:     Therapy/Group: Individual Therapy  Leroy Libman 02/03/2020, 11:29 AM

## 2020-02-03 NOTE — Progress Notes (Signed)
Occupational Therapy Session Note  Patient Details  Name: Terrence Carr MRN: [id number] Date of Birth: 24-Mar-1961  Today's Date: 02/03/2020 OT Individual Time: 0815-0910 OT Individual Time Calculation (min): 55 min    Short Term Goals: Week 1:  OT Short Term Goal 1 (Week 1): STGs=LTGs due to ELOS  Skilled Therapeutic Interventions/Progress Updates:    OT intervention with focus on bed mobility, A/P transfers, w/c mobility, BUE strengthening, activity tolerance, and discharge planning.  Bed mobility with S.  A/P transfer to w/c with CGA. Pt completed grooming tasks seated in w/c at sink.  Pt propelled w/c to small gym for Sci[employer] (Random load 5 for 8 mins). Pt returned to room.  LUE AAROM shoulder flexion and resistive exercises to increase LUE function. Pt remained in w/c with all needs within reach..  Therapy Documentation Precautions:  Precautions Precautions: Fall Precaution Comments: Bil AKA Restrictions Weight Bearing Restrictions: Yes RLE Weight Bearing: Non weight bearing LLE Weight Bearing: Non weight bearing General:   Vital Signs:   Pain: Pain Assessment Pain Scale: 0-10 Pain Score: 4  Pain Type: Surgical pain Pain Location: Leg Pain Orientation: Right Pain Descriptors / Indicators: Cramping Pain Frequency: Intermittent Pain Onset: On-going Pain Intervention(s): Medication (See eMAR);Repositioned ADL: ADL Grooming: Setup Where Assessed-Grooming: Edge of bed Upper Body Bathing: Setup Where Assessed-Upper Body Bathing: Edge of bed Lower Body Bathing: Supervision/safety Where Assessed-Lower Body Bathing: Edge of bed, Bed level Upper Body Dressing: Supervision/safety Where Assessed-Upper Body Dressing: Edge of bed Lower Body Dressing: Maximal assistance Where Assessed-Lower Body Dressing: Bed level Toileting: Not assessed Toilet Transfer: Not assessed Tub/Shower Transfer: Not assessed Vision   Perception    Praxis   Exercises:   Other Treatments:      Therapy/Group: Individual Therapy  Leroy Libman 02/03/2020, 9:14 AM

## 2020-02-03 NOTE — Progress Notes (Signed)
[place] PHYSICAL MEDICINE & REHABILITATION PROGRESS NOTE   Subjective/Complaints:  Pt reports doing well- wants 2 tylenol, and admits took some tramadol yesterday but that 'tylenol works best".   Itching of AKA is a little better.   Per Pharmacy- Vanc level sgood and can con't Vanc 500 mg q12 hours at this time. Has PICC line as well.   ROS: pt denies SOB, CP, abd pain, N/V/C/D, vision changes or headache   Objective:   [place] EKG SITE RITE  Result Date: 02/01/2020 If Site Rite image not attached, placement could not be confirmed due to current cardiac rhythm.  Recent Labs    02/02/20 0335  WBC 7.5  HGB 8.5*  HCT 28.0*  PLT 837*   Recent Labs    02/02/20 0335  NA 134*  K 4.3  CL 98  CO2 26  GLUCOSE 106*  BUN 12  CREATININE 0.76  CALCIUM 9.0    Intake/Output Summary (Last 24 hours) at 02/03/2020 0936 Last data filed at 02/03/2020 [id number] Gross per 24 hour  Intake 790 ml  Output 2075 ml  Net -1285 ml     Physical Exam: Vital Signs Blood pressure 133/67, pulse 61, temperature 98.7 F (37.1 C), temperature source Oral, resp. rate 18, SpO2 100 %.  Constitutional: He is oriented to person, place, and time.  Frail, thin appearing male.sitting up in bed; eating breakfast- ~ 50% done; asking for tylenol for pain- relayed message ot nursing;, NAD HENT: Normocephalic and atraumatic Mouth/Throat: No oropharyngeal exudate.  Eyes:  Conjunctivae and EOM are normal.  Neck: No tracheal deviation present.  Cardiovascular: RRR  Respiratory: CTA B/L- no W/R/R  GI:  protuberant still ; NT, soft, (+) BS  Musculoskeletal:     Cervical back: Normal range of motion     Comments:  5-/5 in UEs- deltoids, biceps, triceps, WE, grip and finger abduction HF at least 3+/5 in LEs- pain on R side R AKA wrapped in ACE wrap- seen- no drainage, no erythema; mild swelling; looks great; has some dry skin on AKA- not seen today  L AKA healed with very little muscle/very thin  Neurological:  He is oriented to person, place, and time.  Sensation intact in UEs B/L and B/L AKAs. Increased sensitivity in RLE esp inner thigh above ACE wrap- no wound or redness at TTP location.   Skin:  Right AKA is dressed appropriately tender.  Left AKA site well-healed with some dry skin.  Has new PICC line in RUE   Psychiatric: In good spirits- brighter than at last rehab stay.   Assessment/Plan: 1. Functional deficits secondary new R AKA and recent L AKA as well as bacteremia on IV Vanc to which require 3+ hours per day of interdisciplinary therapy in a comprehensive inpatient rehab setting.  Physiatrist is providing close team supervision and 24 hour management of active medical problems listed below.  Physiatrist and rehab team continue to assess barriers to discharge/monitor patient progress toward functional and medical goals  Care Tool:  Bathing    Body parts bathed by patient: Right arm, Left arm, Chest, Abdomen, Face, Front perineal area, Buttocks, Left upper leg, Right upper leg     Body parts n/a: Right lower leg, Left lower leg   Bathing assist Assist Level: Set up assist     Upper Body Dressing/Undressing Upper body dressing   What is the patient wearing?: Pull over shirt    Upper body assist Assist Level: Set up assist    Lower Body Dressing/Undressing  Lower body dressing      What is the patient wearing?: Pants     Lower body assist Assist for lower body dressing: Contact Guard/Touching assist     Toileting Toileting Toileting Activity did not occur (Clothing management and hygiene only): N/A (no void or bm)  Toileting assist Assist for toileting: Minimal Assistance - Patient > 75% Assistive Device Comment: urnial   Transfers Chair/bed transfer  Transfers assist     Chair/bed transfer assist level: Minimal Assistance - Patient > 75%     Locomotion Ambulation   Ambulation assist   Ambulation activity did not occur: N/A          Walk 10  feet activity   Assist  Walk 10 feet activity did not occur: N/A        Walk 50 feet activity   Assist Walk 50 feet with 2 turns activity did not occur: N/A         Walk 150 feet activity   Assist Walk 150 feet activity did not occur: N/A         Walk 10 feet on uneven surface  activity   Assist Walk 10 feet on uneven surfaces activity did not occur: N/A         Wheelchair     Assist Will patient use wheelchair at discharge?: Yes Type of Wheelchair: Manual    Wheelchair assist level: Supervision/Verbal cueing Max wheelchair distance: 200'    Wheelchair 50 feet with 2 turns activity    Assist        Assist Level: Supervision/Verbal cueing   Wheelchair 150 feet activity     Assist      Assist Level: Supervision/Verbal cueing   Blood pressure 133/67, pulse 61, temperature 98.7 F (37.1 C), temperature source Oral, resp. rate 18, SpO2 100 %.  Medical Problem List and Plan: 1.  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   1/30: Reviewed care team notes. Participated in [facility] mobility 150 feet yesterday, voiding on own. Supervision to [place] for self-care.  2/2- dry skin- started eucerin BID for dry AKA   2/3- itching improved somewhat 2.  Antithrombotics: -DVT/anticoagulation: Subcutaneous heparin             -antiplatelet therapy: Aspirin 81 mg daily 3. Pain Management: Neurontin 100 mg 3 times daily, oxycodone as needed  1/30: pain is well controlled.   1/31- new worsening pain in R AKA- if doesn't improve, will d/w vascular.  2/2- will d/c oxy- not helpful per pt and start Tramadol 50 mg q6 hours prn.    2/3- per pt, was helpful-  nursing was concerned and felt oxy was helpful, but pt appeared ok with tramadol- best choice for d/c med, so will con't for now.  4. Mood: Wellbutrin 100 mg twice daily             -antipsychotic agents: N/A 5. Neuropsych: This patient is capable of making decisions on his own behalf. 6. Skin/Wound Care: Routine skin checks for new R AKA incision 7. Fluids/Electrolytes/Nutrition: Routine in and outs with follow-up chemistries 8.  Staph  aureus bacteremia.  Follow-up infectious disease.  Repeat blood cultures pending.  Continue vancomycin and discussed duration with infectious disease; pharmacy dosing Vanc  1/29- 10 more days of IV Vanc per ID- will order midline to be placed per ID request.  2/1- will recheck labs in AM  2/2- Cr/BUN/ great and WBC 7.5- con't Vanc- got PICC line per pharmacy team.   2/3- per pharmacy will con't current Vanc dose of 500 mg q12 hours- appropriate trough and Cr levels 9.  Acute blood loss anemia.  Follow-up CBC  1/29- Hb 8.7 up from 8.0- doing better 10.  Vitamin B12 deficiency.  Continue supplement 11.  Hyperlipidemia.  Lipitor 12.  History of tobacco and alcohol use.  Provide counseling. 13. Constipation: 1/31: started senna-docusate BID 14. Insomnia: started 3mg  melatonin PRN bedtime with one additional dose prn if needed.  2/1- still slept poorly due to pain.  2/2- added trazodone 50 mg QHS.    2/3- didn't complain of poor sleep this AM  LOS: 6 days A FACE TO FACE EVALUATION WAS PERFORMED  Terrence Carr 02/03/2020, 9:36 AM

## 2020-02-03 NOTE — Progress Notes (Signed)
Social Work Patient ID: Terrence Carr, male   DOB: 09/16/1961, 59 y.o.   MRN: [id number]  Have reviewed team conference with pt and have left two VMs for step-daughter, Shanta.  Pt aware of targeted d/c date 2/8.  Have asked step-daughter to contact me to arrange fam ed prior to this.  Will continue to follow and alert tx team when have ed times set up.  Cavion Faiola, LCSW

## 2020-02-03 NOTE — Progress Notes (Addendum)
Physical Therapy Session Note  Patient Details  Name: Terrence Carr MRN: [id number] Date of Birth: May 23, 1961  Today's Date: 02/03/2020 PT Individual Time: [id number] PT Individual Time Calculation (min): 55 min   Short Term Goals: Week 1:  PT Short Term Goal 1 (Week 1): =LTG due to ELOS  Skilled Therapeutic Interventions/Progress Updates:   Pt missed 20 min of skilled PT 2/2 RN care. Pt in supine and agreeable to therapy, denies pain. Therapist set-up bed and w/c for AP transfer to w/c, pt performed bed mobility w/ supervision and AP transfer w/ close supervision and verbal cues for technique. Pt self-propelled w/c to/from therapy gym and around unit w/ BUEs to work on endurance training and functional BUE strengthening, >150' at a time in multiple bouts. Performed arm ergometer 5 min forward @ level 2.0 and 5 min backwards at same level. Instructed pt on car transfer via slide board laterally, pt performed w/ CGA. Pt familiar w/ slide board placement and performed w/o assist from therapist. Therapist suggested pt place further under buttocks, however pt stated it was far enough, therapist blocked board from moving during transfer. Pt heavily utilized overhead bar on car, he states his family member's car has one. Slide board back to w/c w/ CGA. Pt w/ good balance on board while performing lateral scoot. Pt self-propelled w/c back to room. Re-wrapped R residual limb as ACE wrap had fallen off. Educated pt on residual limb management including desensitization techniques in anticipation of future prosthetic use. Pt reporting he needed readers to reach [employer], provided pt w/ copy and he plans to ask a family member to bring in glasses for him. Pt very eager to start prosthesis training in the future, reinforced importance of residual limb care now. Ended session in w/c, all needs in reach.   Therapy Documentation Precautions:  Precautions Precautions: Fall Precaution Comments:  Bil AKA Restrictions Weight Bearing Restrictions: Yes RLE Weight Bearing: Non weight bearing LLE Weight Bearing: Non weight bearing Pain: Pain Assessment Pain Scale: 0-10 Pain Score: 5  Faces Pain Scale: Hurts a little bit Pain Type: Surgical pain Pain Location: Leg Pain Orientation: Right;Left Pain Descriptors / Indicators: Aching Pain Frequency: Intermittent Pain Onset: On-going Pain Intervention(s): Medication (See eMAR);Repositioned  Therapy/Group: Individual Therapy  Marisol Glazer Clent Demark 02/03/2020, 6:25 PM

## 2020-02-03 NOTE — Plan of Care (Signed)
  Problem: Consults Goal: RH LIMB LOSS PATIENT EDUCATION Description: Description: See Patient Education module for eduction specifics. Outcome: Progressing Goal: Skin Care Protocol Initiated - if Braden Score 18 or less Description: If consults are not indicated, leave blank or document N/A Outcome: Progressing   Problem: RH BOWEL ELIMINATION Goal: RH STG MANAGE BOWEL WITH ASSISTANCE Description: STG Manage Bowel with min Assistance. Outcome: Progressing Flowsheets (Taken 02/03/2020 1515) STG: Pt will manage bowels with assistance: 4-Minimum assistance Goal: RH STG MANAGE BOWEL W/MEDICATION W/ASSISTANCE Description: STG Manage Bowel with Medication with mod I Assistance. Outcome: Progressing Flowsheets (Taken 02/03/2020 1515) STG: Pt will manage bowels with medication with assistance: 4-Minimal assistance   Problem: RH BLADDER ELIMINATION Goal: RH STG MANAGE BLADDER WITH ASSISTANCE Description: STG Manage Bladder With min Assistance Outcome: Progressing Flowsheets (Taken 02/03/2020 1515) STG: Pt will manage bladder with assistance: 5-Supervision/set up   Problem: RH SKIN INTEGRITY Goal: RH STG SKIN FREE OF INFECTION/BREAKDOWN Description: Pt will be free of skin breakdown or infection with min assist prior to DC, improvement to Stage 2 on buttocks as well Outcome: Progressing Goal: RH STG MAINTAIN SKIN INTEGRITY WITH ASSISTANCE Description: STG Maintain Skin Integrity With min Assistance. Outcome: Progressing Flowsheets (Taken 02/03/2020 1515) STG: Maintain skin integrity with assistance: 3-Moderate assistance Goal: RH STG ABLE TO PERFORM INCISION/WOUND CARE W/ASSISTANCE Description: STG Able To Perform Incision/Wound Care With min Assistance. Outcome: Progressing Flowsheets (Taken 02/03/2020 1515) STG: Pt will be able to perform incision/wound care with assistance: 3-Moderate assistance   Problem: RH PAIN MANAGEMENT Goal: RH STG PAIN MANAGED AT OR BELOW PT'S PAIN GOAL Description:  Less than 3 on 0-10 scale Outcome: Progressing

## 2020-02-04 ENCOUNTER — Inpatient Hospital Stay (HOSPITAL_COMMUNITY): Payer: Medicare Other

## 2020-02-04 ENCOUNTER — Inpatient Hospital Stay (HOSPITAL_COMMUNITY): Payer: Medicare Other | Admitting: Physical Therapy

## 2020-02-04 NOTE — Progress Notes (Signed)
[place] PHYSICAL MEDICINE & REHABILITATION PROGRESS NOTE   Subjective/Complaints:  Pt reports slept "a little bit" last night- much improved over last few nights- doing all right this AM- happy with how he's doing.    ROS: pt denies SOB, CP, abd pain, N/V/C/D, vision changes or headache   Objective:   No results found. Recent Labs    02/02/20 0335  WBC 7.5  HGB 8.5*  HCT 28.0*  PLT 837*   Recent Labs    02/02/20 0335  NA 134*  K 4.3  CL 98  CO2 26  GLUCOSE 106*  BUN 12  CREATININE 0.76  CALCIUM 9.0    Intake/Output Summary (Last 24 hours) at 02/04/2020 0928 Last data filed at 02/04/2020 0814 Gross per 24 hour  Intake 760 ml  Output 1050 ml  Net -290 ml     Physical Exam: Vital Signs Blood pressure (!) 125/58, pulse (!) 57, temperature 98 F (36.7 C), resp. rate 18, SpO2 99 %.  Constitutional: He is oriented to person, place, and time.  Frail, thin appearing male.up in manual w/c with OT heading out of room; asking for coffee; NAD HENT: Normocephalic and atraumatic Mouth/Throat: No oropharyngeal exudate.  Eyes:  Conjunctivae and EOM are normal.  Neck: No tracheal deviation present.  Cardiovascular: RRR  Respiratory: CTA B/L- no W/R/R  GI:  protuberant still ; NT, soft, (+) BS  Musculoskeletal:     Cervical back: Normal range of motion     Comments:  5-/5 in UEs- deltoids, biceps, triceps, WE, grip and finger abduction HF at least 3+/5 in LEs- pain on R side R AKA wrapped in ACE wrap- seen- no drainage, no erythema; looks fantastic- staples intact; no signs of skin breakdown L AKA healed with very little muscle/very thin  Neurological: He is oriented to person, place, and time.  Sensation intact in UEs B/L and B/L AKAs. Increased sensitivity in RLE esp inner thigh above ACE wrap- no wound or redness at TTP location.   Skin:  Right AKA is dressed appropriately tender.  Left AKA site well-healed with some dry skin.  Has new PICC line in RUE    Psychiatric: In good spirits- best I've seen him today   Assessment/Plan: 1. Functional deficits secondary new R AKA and recent L AKA as well as bacteremia on IV Vanc to which require 3+ hours per day of interdisciplinary therapy in a comprehensive inpatient rehab setting.  Physiatrist is providing close team supervision and 24 hour management of active medical problems listed below.  Physiatrist and rehab team continue to assess barriers to discharge/monitor patient progress toward functional and medical goals  Care Tool:  Bathing    Body parts bathed by patient: Right arm, Left arm, Chest, Abdomen, Face, Front perineal area, Buttocks, Left upper leg, Right upper leg     Body parts n/a: Right lower leg, Left lower leg   Bathing assist Assist Level: Set up assist     Upper Body Dressing/Undressing Upper body dressing   What is the patient wearing?: Pull over shirt    Upper body assist Assist Level: Set up assist    Lower Body Dressing/Undressing Lower body dressing      What is the patient wearing?: Pants     Lower body assist Assist for lower body dressing: Contact Guard/Touching assist     Toileting Toileting Toileting Activity did not occur (Clothing management and hygiene only): N/A (no void or bm)  Toileting assist Assist for toileting: Minimal Assistance -  Patient > 75% Assistive Device Comment: urnial   Transfers Chair/bed transfer  Transfers assist     Chair/bed transfer assist level: Contact Guard/Touching assist     Locomotion Ambulation   Ambulation assist   Ambulation activity did not occur: N/A          Walk 10 feet activity   Assist  Walk 10 feet activity did not occur: N/A        Walk 50 feet activity   Assist Walk 50 feet with 2 turns activity did not occur: N/A         Walk 150 feet activity   Assist Walk 150 feet activity did not occur: N/A         Walk 10 feet on uneven surface  activity   Assist  Walk 10 feet on uneven surfaces activity did not occur: N/A         Wheelchair     Assist Will patient use wheelchair at discharge?: Yes Type of Wheelchair: Manual    Wheelchair assist level: Supervision/Verbal cueing Max wheelchair distance: >150'    Wheelchair 50 feet with 2 turns activity    Assist        Assist Level: Supervision/Verbal cueing   Wheelchair 150 feet activity     Assist      Assist Level: Supervision/Verbal cueing   Blood pressure (!) 125/58, pulse (!) 57, temperature 98 F (36.7 C), resp. rate 18, SpO2 99 %.  Medical Problem List and Plan: 1.  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   1/30: Reviewed care team notes. Participated in [facility] mobility 150 feet yesterday, voiding on own. Supervision to [place] for self-care.  2/2- dry skin- started eucerin BID for dry AKA   2/3- itching improved somewhat  2/4- looking great 2.  Antithrombotics: -DVT/anticoagulation: Subcutaneous heparin             -antiplatelet therapy: Aspirin 81 mg daily 3. Pain Management: Neurontin 100 mg 3 times daily, oxycodone as needed  1/30: pain is well controlled.   1/31- new worsening pain in R AKA- if doesn't improve, will d/w vascular.  2/2- will d/c oxy- not helpful per pt and start Tramadol 50 mg q6 hours prn.    2/3- per pt, was helpful- nursing was concerned and felt oxy was helpful, but pt appeared ok with tramadol- best choice for d/c med, so will con't for now.  4. Mood: Wellbutrin 100 mg twice daily             -antipsychotic agents: N/A 5. Neuropsych: This patient is capable of making decisions on his own behalf. 6. Skin/Wound Care: Routine skin checks  for new R AKA incision 7. Fluids/Electrolytes/Nutrition: Routine in and outs with follow-up chemistries 8.  Staph aureus bacteremia.  Follow-up infectious disease.  Repeat blood cultures pending.  Continue vancomycin and discussed duration with infectious disease; pharmacy dosing Vanc  1/29- 10 more days of IV Vanc per ID- will order midline to be placed per ID request.  2/1- will recheck labs in AM  2/2- Cr/BUN/ great and WBC 7.5- con't Vanc- got  PICC line per pharmacy team.   2/3- per pharmacy will con't current Vanc dose of 500 mg q12 hours- appropriate trough and Cr levels  2/4- PICC line placed 2 days ago; stop ABX 2/8 9.  Acute blood loss anemia.  Follow-up CBC  1/29- Hb 8.7 up from 8.0- doing better 10.  Vitamin B12 deficiency.  Continue supplement 11.  Hyperlipidemia.  Lipitor 12.  History of tobacco and alcohol use.  Provide counseling. 13. Constipation: 1/31: started senna-docusate BID 14. Insomnia: started 3mg  melatonin PRN bedtime with one additional dose prn if needed.  2/1- still slept poorly due to pain.  2/2- added trazodone 50 mg QHS.    2/3- didn't complain of poor sleep this AM  2/4- slept well with trazodone  LOS: 7 days A FACE TO FACE EVALUATION WAS PERFORMED  Terrence Carr 02/04/2020, 9:28 AM

## 2020-02-04 NOTE — Progress Notes (Signed)
Occupational Therapy Session Note  Patient Details  Name: Terrence Carr MRN: [id number] Date of Birth: 06/24/1961  Today's Date: 02/04/2020 OT Individual Time: 1330-1425 OT Individual Time Calculation (min): 55 min    Short Term Goals: Week 1:  OT Short Term Goal 1 (Week 1): STGs=LTGs due to ELOS  Skilled Therapeutic Interventions/Progress Updates:    Pt resting in w/c upon arrival.  OT intervention with focus on functional transfers and BUE therex for general strengthening and increased functional use of LUE. A/P tranfsers with close supervision to steady equipment during transfers.  LUE PROM/AAROM/AROM with focus on shoulder flexion and scapula protraction/retraction. Chest presses, trunk rotation, horizontal adduction, and punches 10 X 3 with rest breaks.  Pt remained in w/c with all needs within reach and belt alarm activated.   Therapy Documentation Precautions:  Precautions Precautions: Fall Precaution Comments: Bil AKA Restrictions Weight Bearing Restrictions: Yes RLE Weight Bearing: Non weight bearing LLE Weight Bearing: Non weight bearing  Pain:  Pt c/o 8/10 pain in RLE; RN aware and admin meds during session   Therapy/Group: Individual Therapy  Leroy Libman 02/04/2020, 2:38 PM

## 2020-02-04 NOTE — Progress Notes (Signed)
Physical Therapy Session Note  Patient Details  Name: Terrence Carr MRN: [id number] Date of Birth: [birth year]  Today's Date: 02/04/2020 PT Individual Time: 1000-1100 PT Individual Time Calculation (min): 60 min   Short Term Goals: Week 1:  PT Short Term Goal 1 (Week 1): =LTG due to ELOS  Skilled Therapeutic Interventions/Progress Updates:    Pt received seated in w/c in room, agreeable to PT session. Pt reports some pain in RLE, not rated. Pt requesting pain medication at end of session, RN notified. Manual w/c propulsion 2 x 150 ft with use of BUE at Supervision level. A/P transfer w/c to/from mat table with CGA for trunk control. Seated balance on mat table performing rebounder with CGA to min A, one LOB to the R requiring min A to recover. Seated to supine to prone at Supervision level. Supine BLE strengthening therex: SLR, hip abd; prone therex: hip ext and glute sets; L sidelying therex: hip abd, hip ext x 10 reps each. Pt returns to sitting at Supervision level. A/P transfer back to w/c at [facility] level. Pt left seated in w/c in room with needs in reach at end of session.  Therapy Documentation Precautions:  Precautions Precautions: Fall Precaution Comments: Bil AKA Restrictions Weight Bearing Restrictions: Yes RLE Weight Bearing: Non weight bearing LLE Weight Bearing: Non weight bearing    Therapy/Group: Individual Therapy   Excell Seltzer, PT, DPT  02/04/2020, 12:01 PM

## 2020-02-04 NOTE — Progress Notes (Signed)
Occupational Therapy Session Note  Patient Details  Name: Terrence Carr MRN: [id number] Date of Birth: 08/20/1961  Today's Date: 02/04/2020 OT Individual Time: 0800-0900 OT Individual Time Calculation (min): 60 min    Short Term Goals: Week 1:  OT Short Term Goal 1 (Week 1): STGs=LTGs due to ELOS  Skilled Therapeutic Interventions/Progress Updates:    Pt resting in bed upon arrival.  OT intervention with focus on bed mobility, functional transfers, w/c mobility, BUE therex, activity tolerance, and safety awareness to increase independence with BADLs and prepare for discharge home. Bed mobilty, A/P transfers, and w/c mobility with supervision.  Pt practived w/c mobility in simulated home envirionment. BUE therex on SciFit (8 min X 2 load 5). LUE AAROM and resistive exercises to increase function of LUE during ADLs. Pt returned to room and remained in w/c with all needs withn reach.  Belt alarm activated.  Therapy Documentation Precautions:  Precautions Precautions: Fall Precaution Comments: Bil AKA Restrictions Weight Bearing Restrictions: Yes RLE Weight Bearing: Non weight bearing LLE Weight Bearing: Non weight bearing Pain: Pt c/o L shoulder discomfort and limited AROM; PROM and activity Therapy/Group: Individual Therapy  Leroy Libman 02/04/2020, 9:00 AM

## 2020-02-05 ENCOUNTER — Inpatient Hospital Stay (HOSPITAL_COMMUNITY): Payer: Medicare Other

## 2020-02-05 ENCOUNTER — Encounter (HOSPITAL_COMMUNITY): Payer: Self-pay | Admitting: Physical Medicine and Rehabilitation

## 2020-02-05 ENCOUNTER — Inpatient Hospital Stay (HOSPITAL_COMMUNITY): Payer: Medicare Other | Admitting: Physical Therapy

## 2020-02-05 NOTE — Progress Notes (Signed)
Occupational Therapy Session Note  Patient Details  Name: TRISTEN ARRONA MRN: [id number] Date of Birth: [birth year]  Today's Date: 02/05/2020 OT Individual Time: 1300-1330 OT Individual Time Calculation (min): 30 min    Short Term Goals: Week 1:  OT Short Term Goal 1 (Week 1): STGs=LTGs due to ELOS  Skilled Therapeutic Interventions/Progress Updates:     Pt resting in w/c upon arrival.  Pt stated he was incontinent of bowel.  Pt performed A/P transfer to bed with supervision and removed pants.  Pt required assistance with cleaning buttocks at bed level.  Pt donned pants with supervision and transferred back to bed. Pt remained in w/c with all needs within reach and belt alarm activated.   Therapy Documentation Precautions:  Precautions Precautions: Fall Precaution Comments: Bil AKA Restrictions Weight Bearing Restrictions: Yes RLE Weight Bearing: Non weight bearing LLE Weight Bearing: Non weight bearing Pain:  Pt commented that his RLE was "starting to hurt" at end of session; repositioned and RN notified   Therapy/Group: Individual Therapy  Leroy Libman 02/05/2020, 2:49 PM

## 2020-02-05 NOTE — Progress Notes (Addendum)
Pharmacy Antibiotic Note  Terrence Carr is a 59 y.o. male admitted on 01/28/2020 with right foot/leg gangrene. S/P AKA of right leg yesterday, now Quadriplegic. Now with staph aureus in 1/2 blood cultures.  Pharmacy has been consulted for vancomycin dosing.  Noted that patient has lower body mass due to bilateral AKAs.   2/2 Vancomycin levels demonstrate therapeutic AUC, Cr remains stable.   Plan: -Continue Vancomycin 500 mg every 12 hours -Next BMP 2/6 then Q Mon -Monitor renal function, levels as appropriate   Temp (24hrs), Avg:98.1 F (36.7 C), Min:97.9 F (36.6 C), Max:98.3 F (36.8 C)  Recent Labs  Lab 02/02/20 0335 02/02/20 1035 02/02/20 2059  WBC 7.5  --   --   CREATININE 0.76  --   --   VANCOTROUGH  --   --  11*  VANCOPEAK  --  22*  --     Estimated Creatinine Clearance: 75 mL/min (by C-G formula based on SCr of 0.76 mg/dL).    Antibiotics: Vanc 1/27 >> (2/10) - stop date entered  Microbiology: 1/25 Bcx: 1/2 staph aureus  1/26 MRSA pcr + 1/25 covid/flu neg  1/27 bld x2: ngtd  Benetta Spar, PharmD, BCPS, [facility] Clinical Pharmacist  Please check AMION for all [place] phone numbers After 10:00 PM, call [place] [phone]

## 2020-02-05 NOTE — Progress Notes (Signed)
[place] PHYSICAL MEDICINE & REHABILITATION PROGRESS NOTE   Subjective/Complaints:  Pt reports doing well- Feels like LUE is weaker than R due to previous "small stroke" that affected L side.  LBM yesterday- bowels working well.    ROS: pt denies SOB, CP, abd pain, N/V/C/D, vision changes or headache   Objective:   No results found. No results for input(s): WBC, HGB, HCT, PLT in the last 72 hours. No results for input(s): NA, K, CL, CO2, GLUCOSE, BUN, CREATININE, CALCIUM in the last 72 hours.  Intake/Output Summary (Last 24 hours) at 02/05/2020 1010 Last data filed at 02/05/2020 0700 Gross per 24 hour  Intake 1220 ml  Output 2115 ml  Net -895 ml     Physical Exam: Vital Signs Blood pressure 138/65, pulse (!) 54, temperature 97.9 F (36.6 C), temperature source Oral, resp. rate 16, SpO2 100 %.  Constitutional: He is oriented to person, place, and time.  Frail, thin appearing male.up in manual w/c in hallway with OT; kept rubbing LUE when at rest, not moving; NAD HENT: Normocephalic and atraumatic Mouth/Throat: No oropharyngeal exudate.  Eyes:  Conjunctivae and EOM are normal.  Neck: No tracheal deviation present.  Cardiovascular: RRR  Respiratory: CTA B/L- no W/R/R  GI:  protuberant still ; NT, soft, (+) BS  Musculoskeletal:     Cervical back: Normal range of motion     Comments:  5-/5 in UEs- deltoids, biceps, triceps, WE, grip and finger abduction HF at least 3+/5 in LEs- pain on R side R AKA wrapped in ACE wrap- seen- no drainage, no erythema; looks fantastic- staples intact; no signs of skin breakdown L AKA healed with very little muscle/very thin  Neurological: He is oriented to person, place, and time.  Sensation intact in UEs B/L and B/L AKAs. Increased sensitivity in RLE esp inner thigh above ACE wrap- no wound or redness at TTP location.   Skin:  Right AKA is dressed appropriately tender.  Left AKA site well-healed with some dry skin.  Has new PICC line  in RUE   Psychiatric: In good spirits- best I've seen him today   Assessment/Plan: 1. Functional deficits secondary new R AKA and recent L AKA as well as bacteremia on IV Vanc to which require 3+ hours per day of interdisciplinary therapy in a comprehensive inpatient rehab setting.  Physiatrist is providing close team supervision and 24 hour management of active medical problems listed below.  Physiatrist and rehab team continue to assess barriers to discharge/monitor patient progress toward functional and medical goals  Care Tool:  Bathing    Body parts bathed by patient: Right arm, Left arm, Chest, Abdomen, Face, Front perineal area, Buttocks, Left upper leg, Right upper leg     Body parts n/a: Right lower leg, Left lower leg   Bathing assist Assist Level: Set up assist     Upper Body Dressing/Undressing Upper body dressing   What is the patient wearing?: Pull over shirt    Upper body assist Assist Level: Set up assist    Lower Body Dressing/Undressing Lower body dressing      What is the patient wearing?: Pants     Lower body assist Assist for lower body dressing: Contact Guard/Touching assist     Toileting Toileting Toileting Activity did not occur (Clothing management and hygiene only): N/A (no void or bm)  Toileting assist Assist for toileting: Minimal Assistance - Patient > 75% Assistive Device Comment: urnial   Transfers Chair/bed transfer  Transfers assist  Chair/bed transfer assist level: Contact Guard/Touching assist     Locomotion Ambulation   Ambulation assist   Ambulation activity did not occur: N/A          Walk 10 feet activity   Assist  Walk 10 feet activity did not occur: N/A        Walk 50 feet activity   Assist Walk 50 feet with 2 turns activity did not occur: N/A         Walk 150 feet activity   Assist Walk 150 feet activity did not occur: N/A         Walk 10 feet on uneven surface   activity   Assist Walk 10 feet on uneven surfaces activity did not occur: N/A         Wheelchair     Assist Will patient use wheelchair at discharge?: Yes Type of Wheelchair: Manual    Wheelchair assist level: Supervision/Verbal cueing Max wheelchair distance: >150'    Wheelchair 50 feet with 2 turns activity    Assist        Assist Level: Supervision/Verbal cueing   Wheelchair 150 feet activity     Assist      Assist Level: Supervision/Verbal cueing   Blood pressure 138/65, pulse (!) 54, temperature 97.9 F (36.6 C), temperature source Oral, resp. rate 16, SpO2 100 %.  Medical Problem List and Plan: 1.  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   1/30: Reviewed care team notes. Participated in [facility] mobility 150 feet yesterday, voiding on own. Supervision to [place] for self-care.  2/2- dry skin- started eucerin BID for dry AKA   2/3- itching improved somewhat  2/4- looking great  2/5- progressing faster than last rehab stay 2.  Antithrombotics: -DVT/anticoagulation: Subcutaneous heparin             -antiplatelet therapy: Aspirin 81 mg daily 3. Pain Management: Neurontin 100 mg 3 times daily, oxycodone as needed  1/30: pain is well controlled.   1/31- new worsening pain in R AKA- if doesn't improve, will d/w vascular.  2/2- will d/c oxy- not helpful per pt and start Tramadol 50 mg q6 hours prn.    2/3- per pt, was helpful- nursing was concerned and felt oxy was helpful, but pt appeared ok with tramadol- best choice for d/c med, so will con't for now.  4. Mood: Wellbutrin 100 mg twice daily             -antipsychotic agents: N/A 5. Neuropsych: This  patient is capable of making decisions on his own behalf. 6. Skin/Wound Care: Routine skin checks for new R AKA incision 7. Fluids/Electrolytes/Nutrition: Routine in and outs with follow-up chemistries 8.  Staph aureus bacteremia.  Follow-up infectious disease.  Repeat blood cultures pending.  Continue vancomycin and discussed duration with infectious disease; pharmacy dosing Vanc  1/29- 10 more days of IV Vanc per ID- will order midline to be placed per ID request.  2/1- will recheck labs in AM  2/2- Cr/BUN/ great and WBC 7.5- con't Vanc- got PICC line per pharmacy team.   2/3- per  pharmacy will con't current Vanc dose of 500 mg q12 hours- appropriate trough and Cr levels  2/4- PICC line placed 2 days ago; stop ABX 2/8  2/5- labs tomorrow and q monday 9.  Acute blood loss anemia.  Follow-up CBC  1/29- Hb 8.7 up from 8.0- doing better 10.  Vitamin B12 deficiency.  Continue supplement 11.  Hyperlipidemia.  Lipitor 12.  History of tobacco and alcohol use.  Provide counseling. 13. Constipation: 1/31: started senna-docusate BID 14. Insomnia: started 3mg  melatonin PRN bedtime with one additional dose prn if needed.  2/2- added trazodone 50 mg QHS.    2/5- slept well with trazodone  LOS: 8 days A FACE TO FACE EVALUATION WAS PERFORMED  Terrence Carr 02/05/2020, 10:10 AM

## 2020-02-05 NOTE — Plan of Care (Signed)
  Problem: Consults Goal: RH LIMB LOSS PATIENT EDUCATION Description: Description: See Patient Education module for eduction specifics. Outcome: Progressing Goal: Skin Care Protocol Initiated - if Braden Score 18 or less Description: If consults are not indicated, leave blank or document N/A Outcome: Progressing   Problem: RH BOWEL ELIMINATION Goal: RH STG MANAGE BOWEL WITH ASSISTANCE Description: STG Manage Bowel with min Assistance. Outcome: Progressing Goal: RH STG MANAGE BOWEL W/MEDICATION W/ASSISTANCE Description: STG Manage Bowel with Medication with mod I Assistance. Outcome: Progressing   Problem: RH BLADDER ELIMINATION Goal: RH STG MANAGE BLADDER WITH ASSISTANCE Description: STG Manage Bladder With min Assistance Outcome: Progressing   Problem: RH SKIN INTEGRITY Goal: RH STG SKIN FREE OF INFECTION/BREAKDOWN Description: Pt will be free of skin breakdown or infection with min assist prior to DC, improvement to Stage 2 on buttocks as well Outcome: Progressing Goal: RH STG MAINTAIN SKIN INTEGRITY WITH ASSISTANCE Description: STG Maintain Skin Integrity With min Assistance. Outcome: Progressing Goal: RH STG ABLE TO PERFORM INCISION/WOUND CARE W/ASSISTANCE Description: STG Able To Perform Incision/Wound Care With min Assistance. Outcome: Progressing   Problem: RH PAIN MANAGEMENT Goal: RH STG PAIN MANAGED AT OR BELOW PT'S PAIN GOAL Description: Less than 3 on 0-10 scale Outcome: Progressing

## 2020-02-05 NOTE — Progress Notes (Signed)
Physical Therapy Session Note  Patient Details  Name: Terrence Carr MRN: [id number] Date of Birth: 04-19-1961  Today's Date: 02/05/2020 PT Individual Time: 1420-1459 PT Individual Time Calculation (min): 39 min   Short Term Goals: Week 1:  PT Short Term Goal 1 (Week 1): =LTG due to ELOS  Skilled Therapeutic Interventions/Progress Updates:   Focused on w/c mobility training for functional strengthening/endurance and obstacle negotiation in community and home environment settings. Pt navigated up/down ramp x 2 trials with min assist to ascend and CGA to descend. Cues for anterior weightshift when ascending hill and reaching further back on wheels for more efficient propulsion technique. UE strengthening and endurance training on UE ergometer on level 1 random program x 3 min forwards and x 3 min backwards. Pt expressed frustration that "that lady said I was leaving Monday without a leg". Asked patient if he had been seen by the prosthetist for measurements yet (he says no) and educated on the lengthy process of being fit for a prosthesis. Recommended to discuss goals with MD during rounds and to follow up on further questions. Pt had reviewed information in [employer] magazine and this PT issued a handout with information about amputee support group meetings (on hold currently due to [place]) in the future with the contact information and meeting information. Pt grateful.    Therapy Documentation Precautions:  Precautions Precautions: Fall Precaution Comments: Bil AKA Restrictions Weight Bearing Restrictions: Yes RLE Weight Bearing: Non weight bearing LLE Weight Bearing: Non weight bearing Pain:  Reports pain in residual limb - does not rate. Notified RN for pain medication and administered during session.    Therapy/Group: Individual Therapy  Canary Brim Ivory Broad, PT, DPT, CBIS  02/05/2020, 3:13 PM

## 2020-02-05 NOTE — Progress Notes (Signed)
Occupational Therapy Session Note  Patient Details  Name: Terrence Carr AGE MRN: [id number] Date of Birth: June 22, 1961  Today's Date: 02/05/2020 OT Individual Time: [id number] OT Individual Time Calculation (min): 55 min    Short Term Goals: Week 1:  OT Short Term Goal 1 (Week 1): STGs=LTGs due to ELOS  Skilled Therapeutic Interventions/Progress Updates:    OT intervention with focus on funcitonal transfers, w/c mobility, LUE functional use, and safety awareness to increase independence with BADLs. Pt practiced maneuvering w/c in simulated home environment with min verbal cues for safety awareness. A/P transfers with supervision. LUE table tasks with focus on shoulder flexion and adduction.  Pt demonstrated LUE exercises. Pt remained in w/c with belt alarm activated and all needs within reach.   Therapy Documentation Precautions:  Precautions Precautions: Fall Precaution Comments: Bil AKA Restrictions Weight Bearing Restrictions: Yes RLE Weight Bearing: Non weight bearing LLE Weight Bearing: Non weight bearing Pain:  Pt c/o RLE soreness; meds admin prior to therapy   Therapy/Group: Individual Therapy  Leroy Libman 02/05/2020, 9:00 AM

## 2020-02-05 NOTE — Progress Notes (Addendum)
Occupational Therapy Discharge Summary  Patient Details  Name: Terrence Carr MRN: [id number] Date of Birth: 1961/01/10   Today's Date: 02/07/2020 OT Individual Time: 1105-1205 OT Individual Time Calculation (min): 60 min   Patient has met 4 of 4 long term goals due to improved activity tolerance, improved balance, postural control, ability to compensate for deficits and improved coordination.  Pt made steady progress with dressing tasks, BSC transfers, and toileting tasks during this admission. Pt completes all tasks with supervision. Patient to discharge at overall Supervision level.  Family education completed via telephone with pts stepdaughter verifying that he will have 24/7 supervision and assist as needed.     Recommendation:  Patient will benefit from ongoing skilled OT services in home health setting to continue to advance functional skills in the area of BADL.  Equipment: No equipment providedPt has TTB and family is independently purchasing a 3:1  Reasons for discharge: treatment goals met and discharge from hospital  Patient/family agrees with progress made and goals achieved: Yes   Skilled Therapeutic Intervention:  Pt greeted in the w/c with a big smile on his face. Looking forward to his d/c tomorrow. Started session by calling Shanta, one of pts caregivers at d/c (stepdaughter). Discussed recommendation for sponge bathing to be completed EOB/bedlevel or w/c level at the sink for now. Educated Shanta to wait on showering until first consulting f/u OT. She verbalized understanding. We talked about AP transfer method, how pt is backing up to transfer surface or scooting forward onto it, and also how to properly place DME (I.e. w/c or 3:1). [facility] reports they do not have a 3:1 but can retrieve one before pt d/cs home tomorrow. She verified that family will be present at [facility] tomorrow for pts PT family ed session. After phone call, tx focus was placed on pt education. Reviewed  proper limb wrapping technique, emphasizing wrapping as far up on thigh as possible for better mgt of swelling. Advised for rewrapping every 3 hours, and also for inspecting both residual limbs using LH mirror. Provided pt with mirror during session and encouraged daily limb inspection during routine. Discussed that if he noticed signs of redness or discharge near incision site to notify his MD. Pt verbalized understanding, receptive, asking questions, and appearing interested in limb loss education. Afterwards he wanted to shave. Provided pt with setup to do so while sitting at the sink. We talked about completing UB exercises using simple household items (I.e. soup cans or heavy books) while sitting outside on his porch or when watching TV. Reviewed pressure relief exercises as well for exercise and skin integrity purposes. At end of session pt remained in his w/c with all needs within reach and safety belt fastened, setting himself up for lunch.     OT Discharge Vision Baseline Vision/History: No visual deficits Patient Visual Report: No change from baseline Vision Assessment?: No apparent visual deficits  ADL ADL Eating: Modified independent Grooming: Setup Where Assessed-Grooming: Wheelchair Upper Body Bathing: Setup Where Assessed-Upper Body Bathing: Edge of bed Lower Body Bathing: Setup Where Assessed-Lower Body Bathing: Edge of bed, Bed level Upper Body Dressing: Setup Where Assessed-Upper Body Dressing: Edge of bed Lower Body Dressing: Setup Where Assessed-Lower Body Dressing: Bed level Toileting: Supervision/safety Toilet Transfer: Not assessed Toilet Transfer Method: Other (comment)(AP transfer) Science writer: Bedside commode(and toilet) Tub/Shower Transfer: Not assessed Perception  Perception: Within Functional Limits Praxis Praxis: Intact Cognition Overall Cognitive Status: Within Functional Limits for tasks assessed Arousal/Alertness: Awake/alert Orientation  Level: Oriented  X4 Attention: Sustained Sustained Attention: Appears intact Memory: Appears intact Immediate Memory Recall: Sock;Blue;Bed Memory Recall Sock: Without Cue Memory Recall Blue: With Cue Memory Recall Bed: Without Cue Awareness: Impaired Awareness Impairment: Anticipatory impairment Problem Solving: Appears intact Safety/Judgment: Appears intact Sensation Sensation Light Touch: Appears Intact Proprioception: Appears Intact Stereognosis: Not tested Coordination Gross Motor Movements are Fluid and Coordinated: No Fine Motor Movements are Fluid and Coordinated: No Motor  Motor Motor: Other (comment) Motor - Skilled Clinical Observations: generalized weakness Mobility    Supervision AP 3:1 transfers Trunk/Postural Assessment  Cervical Assessment Cervical Assessment: Within Functional Limits Thoracic Assessment Thoracic Assessment: (kyphotic) Lumbar Assessment Lumbar Assessment: Within Functional Limits  Balance Static Sitting Balance Static Sitting - Balance Support: Bilateral upper extremity supported Static Sitting - Level of Assistance: 5: Stand by assistance Dynamic Sitting Balance Dynamic Sitting - Balance Support: During functional activity Dynamic Sitting - Level of Assistance: 5: Stand by assistance Extremity/Trunk Assessment RUE Assessment Active Range of Motion (AROM) Comments: Pt drifts into scaption vs shoulder flexion ~110 degrees with truncal compensatory strategies, ~90 degrees abduction General Strength Comments: 3+/5 grossly LUE Assessment Active Range of Motion (AROM) Comments: Active assist ~45-60 degrees scaption + abduction respectively General Strength Comments: 3-/5 grossly   Leroy Libman 02/05/2020, 2:58 PM

## 2020-02-05 NOTE — Progress Notes (Signed)
Physical Therapy Session Note  Patient Details  Name: Terrence Carr MRN: [id number] Date of Birth: 03-Jul-1961  Today's Date: 02/05/2020 PT Individual Time: 1000-1100 PT Individual Time Calculation (min): 60 min   Short Term Goals: Week 1:  PT Short Term Goal 1 (Week 1): =LTG due to ELOS  Skilled Therapeutic Interventions/Progress Updates:    Pt received seated in w/c in room, agreeable to PT session. No complaints of pain this AM. Manual w/c propulsion 2 x 200 ft with use of BUE at mod I level. A/P transfer to mat table with close SBA for sitting balance. Sit to supine Supervision. Provided handout for BLE strengthening and ROM HEP: supine glute sets, SLR, hip abd, hip add squeeze; prone hip ext, prone press-up; sidelying hip abd x 10 reps each. Pt requires min cueing for correct exercise performance. Supine to sit with Supervision. A/P transfer back into w/c with CGA. Car transfer with CGA with assist for SB placement and cueing for safe technique. Pt left seated in w/c in room with needs in reach, quick release belt and chair alarm in place at end of session.  Therapy Documentation Precautions:  Precautions Precautions: Fall Precaution Comments: Bil AKA Restrictions Weight Bearing Restrictions: Yes RLE Weight Bearing: Non weight bearing LLE Weight Bearing: Non weight bearing    Therapy/Group: Individual Therapy   Excell Seltzer, PT, DPT  02/05/2020, 12:40 PM

## 2020-02-06 ENCOUNTER — Inpatient Hospital Stay (HOSPITAL_COMMUNITY): Payer: Medicare Other | Admitting: Physical Therapy

## 2020-02-06 LAB — COMPREHENSIVE METABOLIC PANEL
ALT: 18 U/L (ref 0–44)
AST: 18 U/L (ref 15–41)
Albumin: 2.4 g/dL — ABNORMAL LOW (ref 3.5–5.0)
Alkaline Phosphatase: 106 U/L (ref 38–126)
Anion gap: 9 (ref 5–15)
BUN: 15 mg/dL (ref 6–20)
CO2: 26 mmol/L (ref 22–32)
Calcium: 9.3 mg/dL (ref 8.9–10.3)
Chloride: 100 mmol/L (ref 98–111)
Creatinine, Ser: 0.87 mg/dL (ref 0.61–1.24)
GFR calc Af Amer: >60 mL/min (ref >60)
GFR calc non Af Amer: >60 mL/min (ref >60)
Glucose, Bld: 117 mg/dL — ABNORMAL HIGH (ref 70–99)
Potassium: 4.4 mmol/L (ref 3.5–5.1)
Sodium: 135 mmol/L (ref 135–145)
Total Bilirubin: 0.6 mg/dL (ref 0.3–1.2)
Total Protein: 7.2 g/dL (ref 6.5–8.1)

## 2020-02-06 LAB — CBC WITH DIFFERENTIAL/PLATELET
Abs Immature Granulocytes: 0.11 10*3/uL — ABNORMAL HIGH (ref 0.00–0.07)
Basophils Absolute: 0.1 10*3/uL (ref 0.0–0.1)
Basophils Relative: 2 %
Eosinophils Absolute: 0.4 10*3/uL (ref 0.0–0.5)
Eosinophils Relative: 6 %
HCT: 29.6 % — ABNORMAL LOW (ref 39.0–52.0)
Hemoglobin: 9.1 g/dL — ABNORMAL LOW (ref 13.0–17.0)
Immature Granulocytes: 2 %
Lymphocytes Relative: 23 %
Lymphs Abs: 1.5 10*3/uL (ref 0.7–4.0)
MCH: 29.9 pg (ref 26.0–34.0)
MCHC: 30.7 g/dL (ref 30.0–36.0)
MCV: 97.4 fL (ref 80.0–100.0)
Monocytes Absolute: 1.2 10*3/uL — ABNORMAL HIGH (ref 0.1–1.0)
Monocytes Relative: 18 %
Neutro Abs: 3.3 10*3/uL (ref 1.7–7.7)
Neutrophils Relative %: 49 %
Platelets: 656 10*3/uL — ABNORMAL HIGH (ref 150–400)
RBC: 3.04 MIL/uL — ABNORMAL LOW (ref 4.22–5.81)
RDW: 17.1 % — ABNORMAL HIGH (ref 11.5–15.5)
WBC: 6.6 10*3/uL (ref 4.0–10.5)
nRBC: 0 % (ref 0.0–0.2)

## 2020-02-06 NOTE — Progress Notes (Signed)
[facility] PHYSICAL MEDICINE & REHABILITATION PROGRESS NOTE   Subjective/Complaints:  Pt reports LUE just hasn't worked quite the same since first AKA; otherwise wondering why therapy hasn't come in today- looked at therapy schedule- not due until tomorrow AM.   R AKA/new AKA is bothering him- hasn't asked for pain meds this AM- will do so now.   ROS: pt denies SOB, CP, abd pain, N/V/C/D, vision changes or headache   Objective:   No results found. Recent Labs    02/06/20 0350  WBC 6.6  HGB 9.1*  HCT 29.6*  PLT 656*   Recent Labs    02/06/20 0350  NA 135  K 4.4  CL 100  CO2 26  GLUCOSE 117*  BUN 15  CREATININE 0.87  CALCIUM 9.3    Intake/Output Summary (Last 24 hours) at 02/06/2020 1347 Last data filed at 02/06/2020 1100 Gross per 24 hour  Intake 360 ml  Output 1850 ml  Net -1490 ml     Physical Exam: Vital Signs Blood pressure 125/63, pulse (!) 55, temperature 98.4 F (36.9 C), resp. rate 14, SpO2 100 %.  Constitutional: He is oriented to person, place, and time.  Frail, thin appearing male.sitting up in bed; rubbing R AKA; more talkative/interactive; NAD HENT: Normocephalic and atraumatic Mouth/Throat: No oropharyngeal exudate.  Eyes:  Conjugate gaze.  Neck: No tracheal deviation present.  Cardiovascular: RRR  Respiratory: CTA B/L- no W/R/R  GI:  ND ; NT, soft, (+) BS  Musculoskeletal:     Cervical back: Normal range of motion     Comments:  5-/5 in UEs- deltoids, biceps, triceps, WE, grip and finger abduction HF at least 3+/5 in LEs- pain on R side R AKA wrapped in ACE wrap- seen- no drainage, no erythema; looks fantastic- staples intact; no signs of skin breakdown L AKA healed with very little muscle/very thin  Neurological: He is oriented to person, place, and time.  Sensation intact in UEs B/L and B/L AKAs. Increased sensitivity in RLE esp inner thigh above ACE wrap- no wound or redness at TTP location.   Skin:  Right AKA is dressed  appropriately tender.  Left AKA site well-healed with some dry skin.  Has new PICC line in RUE   Psychiatric: In good spirits- best I've seen him today   Assessment/Plan: 1. Functional deficits secondary new R AKA and recent L AKA as well as bacteremia on IV Vanc to which require 3+ hours per day of interdisciplinary therapy in a comprehensive inpatient rehab setting.  Physiatrist is providing close team supervision and 24 hour management of active medical problems listed below.  Physiatrist and rehab team continue to assess barriers to discharge/monitor patient progress toward functional and medical goals  Care Tool:  Bathing    Body parts bathed by patient: Right arm, Left arm, Chest, Abdomen, Face, Front perineal area, Buttocks, Left upper leg, Right upper leg     Body parts n/a: Right lower leg, Left lower leg   Bathing assist Assist Level: Set up assist     Upper Body Dressing/Undressing Upper body dressing   What is the patient wearing?: Pull over shirt    Upper body assist Assist Level: Set up assist    Lower Body Dressing/Undressing Lower body dressing      What is the patient wearing?: Pants     Lower body assist Assist for lower body dressing: Set up assist     Toileting Toileting Toileting Activity did not occur (Clothing management and hygiene only):  N/A (no void or bm)  Toileting assist Assist for toileting: Supervision/Verbal cueing Assistive Device Comment: urnial   Transfers Chair/bed transfer  Transfers assist     Chair/bed transfer assist level: Contact Guard/Touching assist     Locomotion Ambulation   Ambulation assist   Ambulation activity did not occur: N/A          Walk 10 feet activity   Assist  Walk 10 feet activity did not occur: N/A        Walk 50 feet activity   Assist Walk 50 feet with 2 turns activity did not occur: N/A         Walk 150 feet activity   Assist Walk 150 feet activity did not occur:  N/A         Walk 10 feet on uneven surface  activity   Assist Walk 10 feet on uneven surfaces activity did not occur: N/A         Wheelchair     Assist Will patient use wheelchair at discharge?: Yes Type of Wheelchair: Manual    Wheelchair assist level: Independent Max wheelchair distance: >34'    Wheelchair 50 feet with 2 turns activity    Assist        Assist Level: Independent   Wheelchair 150 feet activity     Assist      Assist Level: Independent   Blood pressure 125/63, pulse (!) 55, temperature 98.4 F (36.9 C), resp. rate 14, SpO2 100 %.  Medical Problem List and Plan: 1.  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   1/30: Reviewed care team notes. Participated in [facility] mobility 150 feet yesterday, voiding on own. Supervision to [place] for self-care.  2/2- dry skin- started eucerin BID for dry AKA   2/3- itching improved somewhat  2/4- looking great  2/5- progressing faster than last rehab stay 2.  Antithrombotics: -DVT/anticoagulation: Subcutaneous heparin             -antiplatelet therapy: Aspirin 81 mg daily 3. Pain Management: Neurontin 100 mg 3 times daily, oxycodone as needed  1/30: pain is well controlled.   1/31- new worsening pain in R AKA- if doesn't improve, will d/w vascular.  2/2- will d/c oxy- not helpful per pt and start Tramadol 50 mg q6 hours prn.    2/3- per pt, was helpful- nursing was concerned and felt oxy was helpful, but pt appeared ok with tramadol- best choice for d/c med, so will con't for now.  4. Mood: Wellbutrin 100 mg twice daily             -antipsychotic agents: N/A 5. Neuropsych: This patient is capable of  making decisions on his own behalf. 6. Skin/Wound Care: Routine skin checks for new R AKA incision 7. Fluids/Electrolytes/Nutrition: Routine in and outs with follow-up chemistries 8.  Staph aureus bacteremia.  Follow-up infectious disease.  Repeat blood cultures pending.  Continue vancomycin and discussed duration with infectious disease; pharmacy dosing Vanc  1/29- 10 more days of IV Vanc per ID- will order midline to be placed per ID request.  2/1- will  recheck labs in AM  2/2- Cr/BUN/ great and WBC 7.5- con't Vanc- got PICC line per pharmacy team.   2/3- per pharmacy will con't current Vanc dose of 500 mg q12 hours- appropriate trough and Cr levels  2/4- PICC line placed 2 days ago; stop ABX 2/8  2/5- labs tomorrow and q Monday  2/6- Labs look great; con't regimen 9.  Acute blood loss anemia.  Follow-up CBC  1/29- Hb 8.7 up from 8.0- doing better  2/6- Hb 9.1- progressing; con't regimen 10.  Vitamin B12 deficiency.  Continue supplement 11.  Hyperlipidemia.  Lipitor 12.  History of tobacco and alcohol use.  Provide counseling. 13. Constipation: 1/31: started senna-docusate BID 14. Insomnia: started 3mg  melatonin PRN bedtime with one additional dose prn if needed.  2/2- added trazodone 50 mg QHS.    2/5- slept well with trazodone  LOS: 9 days A FACE TO FACE EVALUATION WAS PERFORMED  Brianne Maina 02/06/2020, 1:47 PM

## 2020-02-06 NOTE — Plan of Care (Signed)
  Problem: Consults Goal: RH LIMB LOSS PATIENT EDUCATION Description: Description: See Patient Education module for eduction specifics. Outcome: Progressing Goal: Skin Care Protocol Initiated - if Braden Score 18 or less Description: If consults are not indicated, leave blank or document N/A Outcome: Progressing   Problem: RH BOWEL ELIMINATION Goal: RH STG MANAGE BOWEL WITH ASSISTANCE Description: STG Manage Bowel with min Assistance. Outcome: Progressing Goal: RH STG MANAGE BOWEL W/MEDICATION W/ASSISTANCE Description: STG Manage Bowel with Medication with mod I Assistance. Outcome: Progressing   Problem: RH BLADDER ELIMINATION Goal: RH STG MANAGE BLADDER WITH ASSISTANCE Description: STG Manage Bladder With min Assistance Outcome: Progressing   Problem: RH SKIN INTEGRITY Goal: RH STG SKIN FREE OF INFECTION/BREAKDOWN Description: Pt will be free of skin breakdown or infection with min assist prior to DC, improvement to Stage 2 on buttocks as well Outcome: Progressing Goal: RH STG MAINTAIN SKIN INTEGRITY WITH ASSISTANCE Description: STG Maintain Skin Integrity With min Assistance. Outcome: Progressing Goal: RH STG ABLE TO PERFORM INCISION/WOUND CARE W/ASSISTANCE Description: STG Able To Perform Incision/Wound Care With min Assistance. Outcome: Progressing   Problem: RH PAIN MANAGEMENT Goal: RH STG PAIN MANAGED AT OR BELOW PT'S PAIN GOAL Description: Less than 3 on 0-10 scale Outcome: Progressing

## 2020-02-07 ENCOUNTER — Inpatient Hospital Stay (HOSPITAL_COMMUNITY): Payer: Medicare Other | Admitting: Occupational Therapy

## 2020-02-07 ENCOUNTER — Inpatient Hospital Stay (HOSPITAL_COMMUNITY): Payer: Medicare Other | Admitting: Physical Therapy

## 2020-02-07 NOTE — Progress Notes (Signed)
Patient slept mostly throughout the night. Provided pt with PRN tylenol 2x during shift per pt request. No signs of distress at this time, Call light in reach.

## 2020-02-07 NOTE — Progress Notes (Signed)
Physical Therapy Session Note  Patient Details  Name: Terrence Carr MRN: [id number] Date of Birth: January 11, 1961  Today's Date: 02/07/2020 PT Individual Time: 0900-0959 PT Individual Time Calculation (min): 59 min   Short Term Goals: Week 1:  PT Short Term Goal 1 (Week 1): =LTG due to ELOS  Skilled Therapeutic Interventions/Progress Updates:  Pt was seen bedside in the am. Pt able to doff pants, required assistance with brief. Dependent with hygiene. PT required assistance with brief and able to don pants. Figure eight wrapped R AKA.  Pt rolled R/L with side rails and S. Pt transferred supine to edge of bed with S. Pt transferred edge of bed to w/c anterior/posterior transfers with S to c/g. Pt independent with w/c mobility to gym. In gym treatment focused on LE ROM and strengthening. Pt returned to room I in w/c. Pt left sitting up in w/c with seat belt in place and call bell within reach.   Therapy Documentation Precautions:  Precautions Precautions: Fall Precaution Comments: Bil AKA Restrictions Weight Bearing Restrictions: Yes RLE Weight Bearing: Non weight bearing LLE Weight Bearing: Non weight bearing General:   Pain: Pain Assessment Pain Scale: 0-10 Pain Score: 0-No pain  Therapy/Group: Individual Therapy  Dub Amis 02/07/2020, 10:47 AM

## 2020-02-07 NOTE — Progress Notes (Signed)
[facility] PHYSICAL MEDICINE & REHABILITATION PROGRESS NOTE   Subjective/Complaints:  Pt reports he's wondering when therapy coming- doesn't want to stay in bed all day- went through his schedule- due at 9am- is 8am; said will sleep until 9am.   Asked for nursing to rewrap R AKA- is due.   ROS: pt denies SOB, CP, abd pain, N/V/C/D, vision changes or headache   Objective:   No results found. Recent Labs    02/06/20 0350  WBC 6.6  HGB 9.1*  HCT 29.6*  PLT 656*   Recent Labs    02/06/20 0350  NA 135  K 4.4  CL 100  CO2 26  GLUCOSE 117*  BUN 15  CREATININE 0.87  CALCIUM 9.3    Intake/Output Summary (Last 24 hours) at 02/07/2020 1123 Last data filed at 02/07/2020 0900 Gross per 24 hour  Intake 490 ml  Output 1400 ml  Net -910 ml     Physical Exam: Vital Signs Blood pressure 130/63, pulse (!) 58, temperature 98.1 F (36.7 C), resp. rate 18, SpO2 99 %.  Constitutional: He is oriented to person, place, and time.  Frail, thin appearing male.sitting up in bed- sleepy; just woke up; NAD HENT: Normocephalic and atraumatic Mouth/Throat: No oropharyngeal exudate.  Eyes:  Conjugate gaze.  Neck: No tracheal deviation present.  Cardiovascular: RRR  Respiratory: CTA B/L- no W/R/R  GI:  ND ; NT, soft, (+) BS  Musculoskeletal:     Cervical back: Normal range of motion     Comments:  5-/5 in UEs- deltoids, biceps, triceps, WE, grip and finger abduction HF at least 3+/5 in LEs- pain on R side R AKA wrapped in ACE wrap- seen- no drainage, no erythema; looks fantastic- staples intact; no signs of skin breakdown L AKA healed with very little muscle/very thin  Neurological: He is oriented to person, place, and time.  Sensation intact in UEs B/L and B/L AKAs. Increased sensitivity in RLE esp inner thigh above ACE wrap- no wound or redness at TTP location.   Skin:  Right AKA is dressed appropriately tender.  Left AKA site well-healed with some dry skin.  Has new PICC line in  RUE   Psychiatric: In good spirits- best I've seen him today   Assessment/Plan: 1. Functional deficits secondary new R AKA and recent L AKA as well as bacteremia on IV Vanc to which require 3+ hours per day of interdisciplinary therapy in a comprehensive inpatient rehab setting.  Physiatrist is providing close team supervision and 24 hour management of active medical problems listed below.  Physiatrist and rehab team continue to assess barriers to discharge/monitor patient progress toward functional and medical goals  Care Tool:  Bathing    Body parts bathed by patient: Right arm, Left arm, Chest, Abdomen, Face, Front perineal area, Buttocks, Left upper leg, Right upper leg     Body parts n/a: Right lower leg, Left lower leg   Bathing assist Assist Level: Set up assist(per most recent staff documentation)     Upper Body Dressing/Undressing Upper body dressing   What is the patient wearing?: Pull over shirt    Upper body assist Assist Level: Set up assist(per most recent staff documentation)    Lower Body Dressing/Undressing Lower body dressing      What is the patient wearing?: Pants     Lower body assist Assist for lower body dressing: Set up assist(per most recent staff documentation)     Toileting Toileting Toileting Activity did not occur Landscape architect  and hygiene only): N/A (no void or bm)  Toileting assist Assist for toileting: Supervision/Verbal cueing(per most recent staff documentation) Assistive Device Comment: urnial   Transfers Chair/bed transfer  Transfers assist     Chair/bed transfer assist level: Contact Guard/Touching assist     Locomotion Ambulation   Ambulation assist   Ambulation activity did not occur: N/A          Walk 10 feet activity   Assist  Walk 10 feet activity did not occur: N/A        Walk 50 feet activity   Assist Walk 50 feet with 2 turns activity did not occur: N/A         Walk 150 feet  activity   Assist Walk 150 feet activity did not occur: N/A         Walk 10 feet on uneven surface  activity   Assist Walk 10 feet on uneven surfaces activity did not occur: N/A         Wheelchair     Assist Will patient use wheelchair at discharge?: Yes Type of Wheelchair: Manual    Wheelchair assist level: Independent Max wheelchair distance: 150    Wheelchair 50 feet with 2 turns activity    Assist        Assist Level: Independent   Wheelchair 150 feet activity     Assist      Assist Level: Independent   Blood pressure 130/63, pulse (!) 58, temperature 98.1 F (36.7 C), resp. rate 18, SpO2 99 %.  Medical Problem List and Plan: 1.  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   1/30: Reviewed care team notes. Participated in [facility] mobility 150 feet yesterday, voiding on own. Supervision to [place] for self-care.  2/2- dry skin- started eucerin BID for dry AKA   2/3- itching improved somewhat  2/4- looking great  2/5- progressing faster than last rehab stay 2.  Antithrombotics: -DVT/anticoagulation: Subcutaneous heparin             -antiplatelet therapy: Aspirin 81 mg daily 3. Pain Management: Neurontin 100 mg 3 times daily, oxycodone as needed  1/30: pain is well controlled.   1/31- new worsening pain in R AKA- if doesn't improve, will d/w vascular.  2/2- will d/c oxy- not helpful per pt and start Tramadol 50 mg q6 hours prn.    2/3- per pt, was helpful- nursing was concerned and felt oxy was helpful, but pt appeared ok with tramadol- best choice for d/c med, so will con't for now.  4. Mood: Wellbutrin 100 mg twice daily             -antipsychotic  agents: N/A 5. Neuropsych: This patient is capable of making decisions on his own behalf. 6. Skin/Wound Care: Routine skin checks for new R AKA incision 7. Fluids/Electrolytes/Nutrition: Routine in and outs with follow-up chemistries 8.  Staph aureus bacteremia.  Follow-up infectious disease.  Repeat blood cultures pending.  Continue vancomycin and discussed duration with infectious disease; pharmacy dosing Vanc  1/29- 10 more days of IV Vanc per ID- will order midline to be  placed per ID request.  2/1- will recheck labs in AM  2/2- Cr/BUN/ great and WBC 7.5- con't Vanc- got PICC line per pharmacy team.   2/3- per pharmacy will con't current Vanc dose of 500 mg q12 hours- appropriate trough and Cr levels  2/4- PICC line placed 2 days ago; stop ABX 2/8  2/5- labs tomorrow and q Monday  2/6- Labs look great; con't regimen  2/7- labs in AM 9.  Acute blood loss anemia.  Follow-up CBC  1/29- Hb 8.7 up from 8.0- doing better  2/6- Hb 9.1- progressing; con't regimen 10.  Vitamin B12 deficiency.  Continue supplement 11.  Hyperlipidemia.  Lipitor 12.  History of tobacco and alcohol use.  Provide counseling. 13. Constipation: 1/31: started senna-docusate BID 14. Insomnia: started 3mg  melatonin PRN bedtime with one additional dose prn if needed.  2/2- added trazodone 50 mg QHS.    2/5- slept well with trazodone  LOS: 10 days A FACE TO FACE EVALUATION WAS PERFORMED  Mihir Flanigan 02/07/2020, 11:23 AM

## 2020-02-07 NOTE — Discharge Summary (Signed)
Physician Discharge Summary  Patient ID: Terrence Carr MRN: [id number] DOB/AGE: 01/19/1961 59 y.o.  Admit date: 01/28/2020 Discharge date: 02/08/2020  Discharge Diagnoses:  Principal Problem:   S/P bilateral above knee amputation [facility]) Active Problems:   Essential hypertension   Peripheral arterial disease (HCC)   PAD (peripheral artery disease) (HCC)   MSSA bacteremia DVT prophylaxis Mood stabilization Acute blood loss anemia Hyperlipidemia Vitamin B12 deficiency History of tobacco and alcohol use Constipation Insomnia  Discharged Condition: Stable  Significant Diagnostic Studies: ECHOCARDIOGRAM COMPLETE  Result Date: 01/27/2020   ECHOCARDIOGRAM REPORT   Patient Name:   Terrence Carr Date of Exam: 01/27/2020 Medical Rec #:  [id number]        Height:       66.0 in Accession #:    [id number]       Weight:       119.0 lb Date of Birth:  10-02-1961        BSA:          1.60 m Patient Age:    59 years         BP:           143/62 mmHg Patient Gender: M                HR:           66 bpm. Exam Location:  Inpatient Procedure: 2D Echo Indications:    Bacteremia  History:        Patient has no prior history of Echocardiogram examinations.                 Risk Factors:Current Smoker and Hypertension. ETOH abuse.  Sonographer:    Clayton Lefort RDCS (AE) Referring Phys: [place]  1. Left ventricular ejection fraction, by visual estimation, is 60 to 65%. The left ventricle has normal function. There is no left ventricular hypertrophy.  2. The left ventricle has no regional wall motion abnormalities.  3. Global right ventricle has normal systolic function.The right ventricular size is normal. No increase in right ventricular wall thickness.  4. Left atrial size was mild-moderately dilated.  5. Right atrial size was moderately dilated.  6. Trivial pericardial effusion is present.  7. The mitral valve is normal in structure. Trivial mitral valve regurgitation. No evidence of  mitral stenosis.  8. Calcified chordae seen, does not move independently, not suggestive of vegetation.  9. The tricuspid valve is normal in structure. 10. The tricuspid valve is normal in structure. Tricuspid valve regurgitation is not demonstrated. 11. The aortic valve is tricuspid. Aortic valve regurgitation is not visualized. No evidence of aortic valve sclerosis or stenosis. 12. The pulmonic valve was normal in structure. Pulmonic valve regurgitation is not visualized. 13. The inferior vena cava is normal in size with <50% respiratory variability, suggesting right atrial pressure of 8 mmHg. 14. No evidence of valvular vegetations on this transthoracic echocardiogram. Would recommend a transesophageal echocardiogram to exclude infective endocarditis if clinically indicated. FINDINGS  Left Ventricle: Left ventricular ejection fraction, by visual estimation, is 60 to 65%. The left ventricle has normal function. The left ventricle has no regional wall motion abnormalities. There is no left ventricular hypertrophy. Left ventricular diastolic parameters were normal. Normal left atrial pressure. Right Ventricle: The right ventricular size is normal. No increase in right ventricular wall thickness. Global RV systolic function is has normal systolic function. Left Atrium: Left atrial size was mild-moderately dilated. Right Atrium: Right atrial size was moderately dilated  Pericardium: Trivial pericardial effusion is present. Mitral Valve: The mitral valve is normal in structure. Trivial mitral valve regurgitation. No evidence of mitral valve stenosis by observation. MV peak gradient, 4.0 mmHg. Calcified chordae seen, does not move independently, not suggestive of vegetation. Tricuspid Valve: The tricuspid valve is normal in structure. Tricuspid valve regurgitation is not demonstrated. Aortic Valve: The aortic valve is tricuspid. . There is mild thickening of the aortic valve. Aortic valve regurgitation is not visualized.  The aortic valve is structurally normal, with no evidence of sclerosis or stenosis. There is mild thickening of the  aortic valve. Aortic valve mean gradient measures 3.0 mmHg. Aortic valve peak gradient measures 5.4 mmHg. Aortic valve area, by VTI measures 2.40 cm. Pulmonic Valve: The pulmonic valve was normal in structure. Pulmonic valve regurgitation is not visualized. Pulmonic regurgitation is not visualized. Aorta: The aortic root, ascending aorta and aortic arch are all structurally normal, with no evidence of dilitation or obstruction. Venous: The inferior vena cava is normal in size with less than 50% respiratory variability, suggesting right atrial pressure of 8 mmHg. IAS/Shunts: No atrial level shunt detected by color flow Doppler. There is no evidence of a patent foramen ovale. No ventricular septal defect is seen or detected. There is no evidence of an atrial septal defect. Additional Comments: No evidence of valvular vegetations on this transthoracic echocardiogram. Would recommend a transesophageal echocardiogram to exclude infective endocarditis if clinically indicated.  LEFT VENTRICLE PLAX 2D LVIDd:         5.10 cm  Diastology LVIDs:         3.40 cm  LV e' lateral:   12.50 cm/s LV PW:         1.10 cm  LV E/e' lateral: 5.5 LV IVS:        1.00 cm  LV e' medial:    12.40 cm/s LVOT diam:     2.00 cm  LV E/e' medial:  5.5 LV SV:         76 ml LV SV Index:   48.37 LVOT Area:     3.14 cm  RIGHT VENTRICLE             IVC RV Basal diam:  3.30 cm     IVC diam: 2.00 cm RV S prime:     15.60 cm/s TAPSE (M-mode): 2.8 cm LEFT ATRIUM             Index       RIGHT ATRIUM           Index LA diam:        3.20 cm 1.99 cm/m  RA Area:     21.80 cm LA Vol (A2C):   45.1 ml 28.12 ml/m RA Volume:   65.50 ml  40.83 ml/m LA Vol (A4C):   71.3 ml 44.45 ml/m LA Biplane Vol: 61.5 ml 38.34 ml/m  AORTIC VALVE AV Area (Vmax):    2.23 cm AV Area (Vmean):   2.33 cm AV Area (VTI):     2.40 cm AV Vmax:           116.00 cm/s AV  Vmean:          75.000 cm/s AV VTI:            0.242 m AV Peak Grad:      5.4 mmHg AV Mean Grad:      3.0 mmHg LVOT Vmax:         82.20 cm/s LVOT Vmean:  55.700 cm/s LVOT VTI:          0.185 m LVOT/AV VTI ratio: 0.76  AORTA Ao Root diam: 3.20 cm MITRAL VALVE MV Area (PHT): 2.50 cm             SHUNTS MV Peak grad:  4.0 mmHg             Systemic VTI:  0.18 m MV Mean grad:  1.0 mmHg             Systemic Diam: 2.00 cm MV Vmax:       1.00 m/s MV Vmean:      54.8 cm/s MV VTI:        0.31 m MV PHT:        87.87 msec MV Decel Time: 303 msec MV E velocity: 68.60 cm/s 103 cm/s MV A velocity: 47.10 cm/s 70.3 cm/s MV E/A ratio:  1.46       1.5  Buford Dresser MD Electronically signed by Buford Dresser MD Signature Date/Time: 01/27/2020/6:47:10 PM    Final    [place] EKG SITE RITE  Result Date: 02/01/2020 If Site Rite image not attached, placement could not be confirmed due to current cardiac rhythm.   Labs:  Basic Metabolic Panel: Recent Labs  Lab 02/02/20 0335 02/06/20 0350 02/08/20 0404  NA 134* 135 136  K 4.3 4.4 4.0  CL 98 100 101  CO2 26 26 25   GLUCOSE 106* 117* 110*  BUN 12 15 14   CREATININE 0.76 0.87 0.96  CALCIUM 9.0 9.3 9.1    CBC: Recent Labs  Lab 02/02/20 0335 02/06/20 0350 02/08/20 0404  WBC 7.5 6.6 7.3  NEUTROABS 4.3 3.3 4.1  HGB 8.5* 9.1* 8.9*  HCT 28.0* 29.6* 29.5*  MCV 96.2 97.4 98.7  PLT 837* 656* 597*    CBG: No results for input(s): GLUCAP in the last 168 hours.  Family history.  Mother and father with history of hypertension as well as hyperlipidemia.  Denies any colon cancer or rectal cancer esophageal cancer  Brief HPI:   TREVOR HERRERA is a 59 y.o. right-handed male with history of hypertension, tobacco and alcohol use as well as vitamin B12 deficiency with peripheral vascular disease revascularization of right iliac artery stenting placement March 2020 by vascular surgery as well as history of left AKA 12/24/2019 and right common femoral to  below-knee popliteal artery bypass 12/30/2019 receiving inpatient rehab services 12/30/2020 until 01/08/2020 discharge to home maintained on aspirin.  Per chart review lives with spouse and family.  1 level home ramped entrance.  Used a wheelchair but was able to stand on the right leg and pivot.  Presented 01/25/2020 after being seen by vascular surgery and follow-up with noted extensive gangrenous changes of the right foot.  Admission labs hemoglobin 5.6 transfused 2 units packed red blood cells.  Limb was not felt to be salvageable patient somewhat resistant to surgical intervention and underwent right AKA 01/26/2020 per Dr. Donzetta Matters.  Noted blood cultures did show 1 of 2 staph aureus.  Patient initially placed on vancomycin follow-up with infectious disease.  Transthoracic echocardiogram with ejection fraction of 65% no left ventricular hypertrophy.  No regional wall motion abnormalities.  Subcutaneous heparin for DVT prophylaxis.  Latest hemoglobin 8.0.  Patient was admitted for a comprehensive rehab program   Hospital Course: CAYCE HULL was admitted to rehab 01/28/2020 for inpatient therapies to consist of PT, ST and OT at least three hours five days a week. Past admission physiatrist, therapy  team and rehab RN have worked together to provide customized collaborative inpatient rehab.  Pertaining to patient right AKA 01/26/2020 secondary to advanced gangrenous changes as well as history of left AKA 12/24/2019 and right common femoral to below-knee popliteal artery bypass 12/30/2019.  Surgical site dressed as advised close monitoring of wound care patient would follow-up orthopedic services as well as vascular surgery.  Subcutaneous heparin for DVT prophylaxis as well as remaining on low-dose aspirin.  Pain management with the use of Neurontin 100 mg 3 times daily as well as Ultram.  Mood stabilization with Wellbutrin.  Patient was attending therapies.  Staff aureus bacteremia follow-up infectious disease  continued on vancomycin completed 02/08/2020 patient remained afebrile.  Acute blood loss anemia latest hemoglobin 9.1.  Vitamin B12 deficiency maintained on supplement.  Lipitor ongoing for hyperlipidemia.  Noted long history of tobacco alcohol abuse patient received counts regards to cessation of tobacco as well as alcohol it was questionable if he would be compliant with these request.  Bouts of insomnia doing well with trazodone.   Blood pressures were monitored on TID basis and controlled  He/ is continent of bowel and bladder.  He/ has made gains during rehab stay and is attending therapies  He/ will continue to receive follow up therapies   after discharge  Rehab course: During patient's stay in rehab weekly team conferences were held to monitor patient's progress, set goals and discuss barriers to discharge. At admission, patient required minimal assist anterior posterior transfers minimal assist supine to sit mod max assist lower body ADLs  Physical exam.  Blood pressure 147/66 pulse 77 temperature 98.2 respirations 13 oxygen saturations 100% room air Constitutional alert and oriented x3 frail thin appearing HEENT Head.  Normocephalic and atraumatic Eyes.  Pupils round and reactive to light no discharge.nystagmus Mouth/throat no oropharyngeal exudate moist oral mucosa Neck.  Supple nontender no JVD without thyromegaly Cardiac regular rate rhythm without any extra sounds or murmur heard Respiratory effort normal no respiratory distress without wheeze Abdomen soft nontender positive bowel sounds without rebound Musculoskeletal. Cervical back.  Normal range of motion neck supple 5-5 upper extremities deltoids biceps triceps wrist extension grip finger abduction Hip flexors at least 3+ out of 5 lower extremities pain on the right side Right AKA wrapped in Ace wrap Left AKA healed  He/  has had improvement in activity tolerance, balance, postural control as well as ability to  compensate for deficits. He/ has had improvement in functional use RUE/LUE  and RLE/LLE as well as improvement in awareness.  Working with energy conservation techniques.  Focused on wheelchair mobility training for functional strengthening endurance and obstacle negotiation in community and home environment settings.  Patient navigated up and down ramp x2 trials with minimal assist to a send and contact-guard to descend.  Needed some cues for anterior weight shifting.  Patient performed anterior posterior transfer to bed with supervision and removed pants.  Requires assistance with cleaning but except bed level.  Patient donned pants with supervision and transferred back to bed.  Full family teaching completed plan discharge to home       Disposition: Discharge to home    Diet: Regular  Special Instructions: No driving smoking or alcohol  Medications at discharge 1.  Tylenol as needed 2.  Aspirin 81 mg p.o. daily 3.  Lipitor 20 mg p.o. daily 4.  Wellbutrin 100 mg p.o. twice daily 5.  Colace 100 mg p.o. daily 6.  Folic acid 1 mg p.o. daily 7.  Neurontin  100 mg p.o. 3 times daily 8.  Protonix 40 mg p.o. daily 9.  Tramadol 50 mg p.o. every 6 hours as needed pain 10.  Trazodone 50 mg p.o. daily 11.  Vitamin B12 1000 mcg p.o. daily    Follow-up Information    Lovorn, Jinny Blossom, MD Follow up.   Specialty: Physical Medicine and Rehabilitation Why: Office to call for appointment Contact information: [id number] [address] [place] [address] [phone]        Waynetta Sandy, MD Follow up.   Specialties: Vascular Surgery, Cardiology Why: Call for appointment Contact information: [address] [place] [address] [phone]           Signed: Lavon Paganini [employer] 02/08/2020, 5:25 AM

## 2020-02-08 ENCOUNTER — Ambulatory Visit (HOSPITAL_COMMUNITY): Payer: Medicare Other | Admitting: Physical Therapy

## 2020-02-08 LAB — CBC WITH DIFFERENTIAL/PLATELET
Abs Immature Granulocytes: 0.08 10*3/uL — ABNORMAL HIGH (ref 0.00–0.07)
Basophils Absolute: 0.1 10*3/uL (ref 0.0–0.1)
Basophils Relative: 1 %
Eosinophils Absolute: 0.4 10*3/uL (ref 0.0–0.5)
Eosinophils Relative: 5 %
HCT: 29.5 % — ABNORMAL LOW (ref 39.0–52.0)
Hemoglobin: 8.9 g/dL — ABNORMAL LOW (ref 13.0–17.0)
Immature Granulocytes: 1 %
Lymphocytes Relative: 21 %
Lymphs Abs: 1.6 10*3/uL (ref 0.7–4.0)
MCH: 29.8 pg (ref 26.0–34.0)
MCHC: 30.2 g/dL (ref 30.0–36.0)
MCV: 98.7 fL (ref 80.0–100.0)
Monocytes Absolute: 1.1 10*3/uL — ABNORMAL HIGH (ref 0.1–1.0)
Monocytes Relative: 15 %
Neutro Abs: 4.1 10*3/uL (ref 1.7–7.7)
Neutrophils Relative %: 57 %
Platelets: 597 10*3/uL — ABNORMAL HIGH (ref 150–400)
RBC: 2.99 MIL/uL — ABNORMAL LOW (ref 4.22–5.81)
RDW: 17.2 % — ABNORMAL HIGH (ref 11.5–15.5)
WBC: 7.3 10*3/uL (ref 4.0–10.5)
nRBC: 0 % (ref 0.0–0.2)

## 2020-02-08 LAB — COMPREHENSIVE METABOLIC PANEL
ALT: 16 U/L (ref 0–44)
AST: 14 U/L — ABNORMAL LOW (ref 15–41)
Albumin: 2.4 g/dL — ABNORMAL LOW (ref 3.5–5.0)
Alkaline Phosphatase: 127 U/L — ABNORMAL HIGH (ref 38–126)
Anion gap: 10 (ref 5–15)
BUN: 14 mg/dL (ref 6–20)
CO2: 25 mmol/L (ref 22–32)
Calcium: 9.1 mg/dL (ref 8.9–10.3)
Chloride: 101 mmol/L (ref 98–111)
Creatinine, Ser: 0.96 mg/dL (ref 0.61–1.24)
GFR calc Af Amer: >60 mL/min (ref >60)
GFR calc non Af Amer: >60 mL/min (ref >60)
Glucose, Bld: 110 mg/dL — ABNORMAL HIGH (ref 70–99)
Potassium: 4 mmol/L (ref 3.5–5.1)
Sodium: 136 mmol/L (ref 135–145)
Total Bilirubin: 0.4 mg/dL (ref 0.3–1.2)
Total Protein: 6.5 g/dL (ref 6.5–8.1)

## 2020-02-08 MED ORDER — CYANOCOBALAMIN 1000 MCG PO TABS: 1000.0000 ug | ORAL_TABLET | Freq: Every day | ORAL | 1 refills | Status: AC

## 2020-02-08 MED ORDER — PANTOPRAZOLE SODIUM 40 MG PO TBEC: 40.0000 mg | DELAYED_RELEASE_TABLET | Freq: Every day | ORAL | 0 refills | Status: AC

## 2020-02-08 MED ORDER — BUPROPION HCL ER (SR) 100 MG PO TB12: 100.0000 mg | ORAL_TABLET | Freq: Two times a day (BID) | ORAL | 1 refills | Status: AC

## 2020-02-08 MED ORDER — TRAMADOL HCL 50 MG PO TABS: 50.0000 mg | ORAL_TABLET | Freq: Four times a day (QID) | ORAL | 0 refills | Status: AC | PRN

## 2020-02-08 MED ORDER — GABAPENTIN 100 MG PO CAPS: 100.0000 mg | ORAL_CAPSULE | Freq: Three times a day (TID) | ORAL | 1 refills | Status: AC

## 2020-02-08 MED ORDER — ATORVASTATIN CALCIUM 20 MG PO TABS: 20.0000 mg | ORAL_TABLET | Freq: Every day | ORAL | 1 refills | Status: AC

## 2020-02-08 MED ORDER — FOLIC ACID 1 MG PO TABS: 1.0000 mg | ORAL_TABLET | Freq: Every day | ORAL | 0 refills | Status: AC

## 2020-02-08 MED ORDER — TRAZODONE HCL 50 MG PO TABS: 50.0000 mg | ORAL_TABLET | Freq: Every day | ORAL | 0 refills | Status: AC

## 2020-02-08 NOTE — Progress Notes (Signed)
Pharmacy Antibiotic Note  Terrence Carr is a 59 y.o. male admitted on 01/28/2020 with right foot/leg gangrene. S/P AKA of right leg yesterday, now Quadriplegic. Now with staph aureus in 1/2 blood cultures.  Pharmacy has been consulted for vancomycin dosing.  Noted that patient has lower body mass due to bilateral AKAs.   Per ID on 1/29 - pt requires 10 more days of vancomycin max. Stop dates placed through 2/9 which is 2 weeks total after source control (stop 2/10). Cr is up slightly today to 0.96 - given vancomycin is ordered to stop after 3 more doses will defer further vancomycin levels. Check BMET tomorrow, if Cr rises further can likely just stop vancomycin.   Plan: -Continue Vancomycin 500 mg every 12 hours -BMET in am   Temp (24hrs), Avg:98 F (36.7 C), Min:97.9 F (36.6 C), Max:98.1 F (36.7 C)  Recent Labs  Lab 02/02/20 0335 02/02/20 1035 02/02/20 2059 02/06/20 0350 02/08/20 0404  WBC 7.5  --   --  6.6 7.3  CREATININE 0.76  --   --  0.87 0.96  VANCOTROUGH  --   --  11*  --   --   VANCOPEAK  --  22*  --   --   --     Estimated Creatinine Clearance: 62.5 mL/min (by C-G formula based on SCr of 0.96 mg/dL).    Antibiotics: Vanc 1/27 >> (2/10) - stop date entered  Microbiology: 1/25 Bcx: 1/2 staph aureus  1/26 MRSA pcr + 1/25 covid/flu neg  1/27 bld x2: ngtd  Arrie Senate, PharmD, BCPS Clinical Pharmacist [phone] Please check AMION for all [place] numbers 02/08/2020

## 2020-02-08 NOTE — Progress Notes (Signed)
Social Work Discharge Note   The overall goal for the admission was met for:   Discharge location: Yes - home with girlfriend;  Step-daughter to provide 24/7 assistance.  Length of Stay: Yes - 11 days  Discharge activity level: Yes  -supervision/ min assist  Home/community participation: Yes  Services provided included: MD, RD, PT, OT, RN, Pharmacy and SW  Financial Services: Medicaid  Follow-up services arranged: Home Health: RN, PT, OT via [facility], DME: 30" transfer board via [facility] and Patient/Family has no preference for HH/DME agencies  Comments (or additional information):  Contact info - step daughter, Graylon Good @ [phone]  Patient/Family verbalized understanding of follow-up arrangements: Yes  Individual responsible for coordination of the follow-up plan: pt  Confirmed correct DME delivered: Lennart Pall 02/08/2020    Terrence Carr

## 2020-02-08 NOTE — Progress Notes (Signed)
Physical Therapy Discharge Summary  Patient Details  Name: Terrence Carr MRN: [id number] Date of Birth: 06/03/1961  Today's Date: 02/08/2020 PT Individual Time: 1100-1125 PT Minutes: 25 minutes   Patient has met 5 of 5 long term goals due to improved activity tolerance, improved balance, improved postural control, increased strength and ability to compensate for deficits.  Patient to discharge at a wheelchair level Supervision.   Patient's care partner is independent to provide the necessary physical assistance at discharge. Patient's daughter has completed hands on family education and is safe to assist pt at home.  Reasons goals not met: Patient has met all rehab goals.  Recommendation:  Patient will benefit from ongoing skilled PT services in home health setting to continue to advance safe functional mobility, address ongoing impairments in endurance, strength, balance, independence with functional mobility, and minimize fall risk.  Equipment: 30" slide board; pt already owns w/c from previous admission  Reasons for discharge: treatment goals met and discharge from hospital  Patient/family agrees with progress made and goals achieved: Yes   Skilled Intervention: Pt received seated in bed, daughter present for hands-on family education session. Per pt and daughter pt just had PICC line removed and has to stay in bed until 11:25 this AM. Assisted pt with doffing hospital gown and donning shirt and pants with min A. Supine to/from sit independently. Demonstrated how to assist pt with A/P transfers bed to/from w/c and slide board transfers w/c to/from car even though pt unable to physically perform transfers during session due to mobility restrictions. Pt and daughter demo good understanding of how to perform transfers safely and feel comfortable with current assist level. At end of session bed mobility restrictions lifted and pt able to transfer to w/c via A/P transfer at Supervision level.  Pt left seated in w/c in room in care of daughter and RN in order to prepare for d/c.  PT Discharge Precautions/Restrictions Precautions Precautions: Fall Precaution Comments: Bil AKA Restrictions Weight Bearing Restrictions: Yes RLE Weight Bearing: Non weight bearing LLE Weight Bearing: Non weight bearing Vision/Perception  Perception Perception: Within Functional Limits Praxis Praxis: Intact  Cognition Overall Cognitive Status: Within Functional Limits for tasks assessed Arousal/Alertness: Awake/alert Orientation Level: Oriented X4 Attention: Sustained Sustained Attention: Appears intact Memory: Appears intact Awareness: Appears intact Problem Solving: Appears intact Safety/Judgment: Appears intact Sensation Sensation Light Touch: Appears Intact Proprioception: Appears Intact Coordination Gross Motor Movements are Fluid and Coordinated: No Fine Motor Movements are Fluid and Coordinated: Yes Coordination and Movement Description: impaired 2/2 new R AKA, previous L AKA Motor  Motor Motor: Other (comment) Motor - Skilled Clinical Observations: generalized weakness Motor - Discharge Observations: ongoing generalized weakness, improved since eval  Mobility Bed Mobility Bed Mobility: Rolling Right;Rolling Left;Supine to Sit;Sit to Supine Rolling Right: Independent with assistive device Rolling Left: Independent with assistive device Supine to Sit: Independent with assistive device Sit to Supine: Independent with assistive device Transfers Transfers: Anterior-Posterior Transfer Anterior-Posterior Transfer: Supervision/Verbal cueing Transfer (Assistive device): None Locomotion  Gait Ambulation: No Gait Gait: No Stairs / Additional Locomotion Stairs: No Ramp: Engineer, maintenance (IT) Mobility: Yes Wheelchair Assistance: Independent with Camera operator: Both upper extremities Wheelchair Parts Management:  Supervision/cueing Distance: 500  Trunk/Postural Assessment  Cervical Assessment Cervical Assessment: Within Functional Limits Thoracic Assessment Thoracic Assessment: Exceptions to WFL(kyphotic) Lumbar Assessment Lumbar Assessment: Within Functional Limits Postural Control Postural Control: Deficits on evaluation Protective Responses: delayed.  Balance Balance Balance Assessed: Yes Static Sitting Balance Static Sitting - Balance  Support: Bilateral upper extremity supported Static Sitting - Level of Assistance: 5: Stand by assistance Dynamic Sitting Balance Dynamic Sitting - Balance Support: Left upper extremity supported;Right upper extremity supported;During functional activity Dynamic Sitting - Level of Assistance: 5: Stand by assistance Extremity Assessment   RLE Assessment RLE Assessment: Exceptions to [facility] Passive Range of Motion (PROM) Comments: R AKA General Strength Comments: grossly 3/5 in hip LLE Assessment LLE Assessment: Exceptions to [employer] Passive Range of Motion (PROM) Comments: L AKA General Strength Comments: grossly 4/5 in hip     Excell Seltzer, PT, DPT 02/08/2020, 7:52 AM

## 2020-02-08 NOTE — Plan of Care (Signed)
  Problem: Consults Goal: RH LIMB LOSS PATIENT EDUCATION Description: Description: See Patient Education module for eduction specifics. Outcome: Progressing Goal: Skin Care Protocol Initiated - if Braden Score 18 or less Description: If consults are not indicated, leave blank or document N/A Outcome: Progressing   Problem: RH BOWEL ELIMINATION Goal: RH STG MANAGE BOWEL WITH ASSISTANCE Description: STG Manage Bowel with min Assistance. Outcome: Progressing Goal: RH STG MANAGE BOWEL W/MEDICATION W/ASSISTANCE Description: STG Manage Bowel with Medication with mod I Assistance. Outcome: Progressing   Problem: RH BLADDER ELIMINATION Goal: RH STG MANAGE BLADDER WITH ASSISTANCE Description: STG Manage Bladder With min Assistance Outcome: Progressing   Problem: RH SKIN INTEGRITY Goal: RH STG SKIN FREE OF INFECTION/BREAKDOWN Description: Pt will be free of skin breakdown or infection with min assist prior to DC, improvement to Stage 2 on buttocks as well Outcome: Progressing Goal: RH STG MAINTAIN SKIN INTEGRITY WITH ASSISTANCE Description: STG Maintain Skin Integrity With min Assistance. Outcome: Progressing Goal: RH STG ABLE TO PERFORM INCISION/WOUND CARE W/ASSISTANCE Description: STG Able To Perform Incision/Wound Care With min Assistance. Outcome: Progressing   Problem: RH PAIN MANAGEMENT Goal: RH STG PAIN MANAGED AT OR BELOW PT'S PAIN GOAL Description: Less than 3 on 0-10 scale Outcome: Progressing

## 2020-02-08 NOTE — Discharge Instructions (Signed)
Inpatient Rehab Discharge Instructions  DEMARLO BHOLA Discharge date and time: No discharge date for patient encounter.   Activities/Precautions/ Functional Status: Activity: activity as tolerated Diet: regular diet Wound Care: keep wound clean and dry Functional status:  ___ No restrictions     ___ Walk up steps independently ___ 24/7 supervision/assistance   ___ Walk up steps with assistance ___ Intermittent supervision/assistance  ___ Bathe/dress independently ___ Walk with walker     _x__ Bathe/dress with assistance ___ Walk Independently    ___ Shower independently ___ Walk with assistance    ___ Shower with assistance ___ No alcohol     ___ Return to work/school ________    COMMUNITY REFERRALS UPON DISCHARGE:    Home Health:   PT     OT     RN                      Agency:  [facility]   Phone:  [phone]   Medical Equipment/Items Ordered:  Transfer board                                                      Agency/Supplier:  [facility] @ [phone]       Special Instructions: No driving smoking or alcohol   My questions have been answered and I understand these instructions. I will adhere to these goals and the provided educational materials after my discharge from the hospital.  Patient/Caregiver Signature _______________________________ Date __________  Clinician Signature _______________________________________ Date __________  Please bring this form and your medication list with you to all your follow-up doctor's appointments.

## 2020-02-09 ENCOUNTER — Telehealth: Payer: Self-pay | Admitting: Registered Nurse

## 2020-02-09 NOTE — Plan of Care (Signed)
  Problem: Consults Goal: RH LIMB LOSS PATIENT EDUCATION Description: Description: See Patient Education module for eduction specifics. Outcome: Completed/Met Goal: Skin Care Protocol Initiated - if Braden Score 18 or less Description: If consults are not indicated, leave blank or document N/A Outcome: Completed/Met   Problem: RH BOWEL ELIMINATION Goal: RH STG MANAGE BOWEL WITH ASSISTANCE Description: STG Manage Bowel with min Assistance. Outcome: Completed/Met Goal: RH STG MANAGE BOWEL W/MEDICATION W/ASSISTANCE Description: STG Manage Bowel with Medication with mod I Assistance. Outcome: Completed/Met   Problem: RH BLADDER ELIMINATION Goal: RH STG MANAGE BLADDER WITH ASSISTANCE Description: STG Manage Bladder With min Assistance Outcome: Completed/Met   Problem: RH SKIN INTEGRITY Goal: RH STG SKIN FREE OF INFECTION/BREAKDOWN Description: Pt will be free of skin breakdown or infection with min assist prior to DC, improvement to Stage 2 on buttocks as well Outcome: Completed/Met Goal: RH STG MAINTAIN SKIN INTEGRITY WITH ASSISTANCE Description: STG Maintain Skin Integrity With min Assistance. Outcome: Completed/Met Goal: RH STG ABLE TO PERFORM INCISION/WOUND CARE W/ASSISTANCE Description: STG Able To Perform Incision/Wound Care With min Assistance. Outcome: Completed/Met   Problem: RH PAIN MANAGEMENT Goal: RH STG PAIN MANAGED AT OR BELOW PT'S PAIN GOAL Description: Less than 3 on 0-10 scale Outcome: Completed/Met

## 2020-02-09 NOTE — Telephone Encounter (Signed)
Placed  Call to Terrence Carr, no answer, voicemail not set up. Placed a call to his step-daughter Terrence Carr, no answer, left message regarding Terrence Carr appointment. Awaiting a return call.

## 2020-02-19 ENCOUNTER — Telehealth (HOSPITAL_COMMUNITY): Payer: Self-pay

## 2020-02-19 ENCOUNTER — Encounter (HOSPITAL_COMMUNITY): Payer: Medicaid Other

## 2020-02-19 ENCOUNTER — Ambulatory Visit: Payer: Medicaid Other

## 2020-02-19 NOTE — Telephone Encounter (Signed)

## 2020-02-22 ENCOUNTER — Encounter
Payer: Medicare Other | Attending: Physical Medicine and Rehabilitation | Admitting: Physical Medicine and Rehabilitation

## 2020-02-22 ENCOUNTER — Other Ambulatory Visit: Payer: Self-pay

## 2020-02-22 ENCOUNTER — Ambulatory Visit (INDEPENDENT_AMBULATORY_CARE_PROVIDER_SITE_OTHER): Payer: Medicare Other | Admitting: Physician Assistant

## 2020-02-22 VITALS — BP 157/76 | HR 66 | Temp 97.3°F | Resp 16

## 2020-02-22 DIAGNOSIS — Z89612 Acquired absence of left leg above knee: Secondary | ICD-10-CM | POA: Insufficient documentation

## 2020-02-22 DIAGNOSIS — I739 Peripheral vascular disease, unspecified: Secondary | ICD-10-CM

## 2020-02-22 DIAGNOSIS — S78112A Complete traumatic amputation at level between left hip and knee, initial encounter: Secondary | ICD-10-CM

## 2020-02-22 NOTE — Progress Notes (Signed)
POST OPERATIVE OFFICE NOTE    CC:  F/u for surgery  HPI:  This is a 59 y.o. 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 Donnetta Hutching. He then underwent an angiogram with runoff on 12/28/19 for right lower extremity wounds which showed SFA flush occlusion with above knee popliteal reconstitution and three vessel runoff. He subsequently underwent aright common femoral profundofemoral endarterectomy and right common femoral to below knee popliteal artery bypass with ipsilateral, translocated and reversed greater saphenous vein by Dr. Donzetta Matters on 12/30/19              He was last seen in the  01/21/20 for left AKA staple removal and RLE wound check. He had dry gangrene of RLE with adequate doppler DP/ PT and peroneal. It was discussed with him that amputation was his best option. He is not willing to proceed with amputation of the right LE at that time.  It was explained to him that if he developed infection or uncontrolled pain he would follow up earlier.  He subsequently presented to the ED on 01/25/20 with intolerable right lower extremity pain. Limb was not felt to be salvageable due to extensive gangrene. He was agreeable and underwent a Right AKA on 01/26/20 by Dr. Donzetta Matters.  He follows up today for his post op visit and staple removal. He occasionally has some "jumping" pains in right above knee amputation. Otherwise tolerable pain. No complaints in his left above knee amputation stump  He does report that his partner of 26 years passed away over this past weekend from cancer so he is having a very hard time right now  Allergies  Allergen Reactions  . Ibuprofen Other (See Comments)    Patient said "the little brown pills" that he bought from the store made him talk out of his head (reported by [facility])  . Other Other (See Comments)    Unknown reaction to "Dihydroxyalaminum aminoacetate magnesium carbonate" (reported by [facility]    Current Outpatient Medications  Medication Sig Dispense  Refill  . acetaminophen (TYLENOL) 325 MG tablet Take 1-2 tablets (325-650 mg total) by mouth every 4 (four) hours as needed for mild pain (or temp >/= 101 F).    Marland Kitchen aspirin EC 81 MG EC tablet Take 1 tablet (81 mg total) by mouth daily.    Marland Kitchen atorvastatin (LIPITOR) 20 MG tablet Take 1 tablet (20 mg total) by mouth daily at 6 PM. 30 tablet 1  . buPROPion (WELLBUTRIN SR) 100 MG 12 hr tablet Take 1 tablet (100 mg total) by mouth 2 (two) times daily. 60 tablet 1  . cyanocobalamin 1000 MCG tablet Take 1 tablet (1,000 mcg total) by mouth daily. 30 tablet 1  . docusate sodium (COLACE) 100 MG capsule Take 1 capsule (100 mg total) by mouth 2 (two) times daily. 10 capsule 0  . folic acid (FOLVITE) 1 MG tablet Take 1 tablet (1 mg total) by mouth daily. 30 tablet 0  . gabapentin (NEURONTIN) 100 MG capsule Take 1 capsule (100 mg total) by mouth 3 (three) times daily. 90 capsule 1  . pantoprazole (PROTONIX) 40 MG tablet Take 1 tablet (40 mg total) by mouth daily. 30 tablet 0  . polyethylene glycol (MIRALAX / GLYCOLAX) 17 g packet Take 17 g by mouth daily as needed for mild constipation. 14 each 0  . traMADol (ULTRAM) 50 MG tablet Take 1 tablet (50 mg total) by mouth every 6 (six) hours as needed for severe pain. 30 tablet  0  . traZODone (DESYREL) 50 MG tablet Take 1 tablet (50 mg total) by mouth at bedtime. 20 tablet 0   No current facility-administered medications for this visit.     ROS:  Review of Systems  Constitutional: Negative for fever and weight loss.  HENT: Negative for congestion and sore throat.   Respiratory: Negative for cough and shortness of breath.   Cardiovascular: Negative for chest pain and palpitations.  Gastrointestinal: Negative for abdominal pain, diarrhea, nausea and vomiting.  Genitourinary: Negative for dysuria.  Musculoskeletal: Negative for joint pain.  Neurological: Negative for dizziness, weakness and headaches.    Physical Exam:  Vitals:   02/22/20 1407  BP: (!) 157/76   Pulse: 66  Resp: 16  Temp: (!) 97.3 F (36.3 C)  TempSrc: Temporal  SpO2: 100%   General: Appears sad but not in any distress Incision: Right above knee amputation stump staples removed. Clean, dry, and intact. No fluid collections, no hematoma. Somewhat edematous stump. Minimal tenderness. 2 steri strips applied to medial margin of stump Extremities:  2 + femoral pulses bilaterally. Left above knee amputation healed Neuro: Alert and oriented Abdomen:  Soft, non tender  Assessment/Plan:  This is a 59 y.o. male who is s/p: bilateral above knee amputations. He is doing well post op. Staples removed from right above knee amputation. Several steri strips applied to medial margin otherwise stump is healing well. He has a home health nurse that comes daily as well as some family members who are able to check on him. He lives in [place] so would prefer to follow up as needed  - Patient will follow up as needed if there is any wound complications or new pain or concerns for healing    Karoline Caldwell, PA-C [facility] [phone]  Clinic MD: Dr. Trula Slade

## 2020-03-25 ENCOUNTER — Ambulatory Visit: Payer: Medicaid Other | Admitting: Vascular Surgery

## 2020-03-25 ENCOUNTER — Encounter (HOSPITAL_COMMUNITY): Payer: Medicaid Other

## 2020-06-12 ENCOUNTER — Other Ambulatory Visit: Payer: Self-pay

## 2020-06-12 ENCOUNTER — Encounter (HOSPITAL_COMMUNITY): Payer: Self-pay | Admitting: Emergency Medicine

## 2020-06-12 ENCOUNTER — Emergency Department (HOSPITAL_COMMUNITY)
Admission: EM | Admit: 2020-06-12 | Discharge: 2020-06-13 | Disposition: A | Discharge status: Home / Self Care | Payer: Medicare Other | Attending: Emergency Medicine | Admitting: Emergency Medicine

## 2020-06-12 DIAGNOSIS — Z79891 Long term (current) use of opiate analgesic: Secondary | ICD-10-CM | POA: Diagnosis not present

## 2020-06-12 DIAGNOSIS — Z79899 Other long term (current) drug therapy: Secondary | ICD-10-CM | POA: Insufficient documentation

## 2020-06-12 DIAGNOSIS — F17210 Nicotine dependence, cigarettes, uncomplicated: Secondary | ICD-10-CM | POA: Insufficient documentation

## 2020-06-12 DIAGNOSIS — I10 Essential (primary) hypertension: Secondary | ICD-10-CM | POA: Insufficient documentation

## 2020-06-12 DIAGNOSIS — M79605 Pain in left leg: Secondary | ICD-10-CM | POA: Diagnosis not present

## 2020-06-12 DIAGNOSIS — Z7982 Long term (current) use of aspirin: Secondary | ICD-10-CM | POA: Diagnosis not present

## 2020-06-12 DIAGNOSIS — Z886 Allergy status to analgesic agent status: Secondary | ICD-10-CM | POA: Diagnosis not present

## 2020-06-12 DIAGNOSIS — M79604 Pain in right leg: Secondary | ICD-10-CM | POA: Insufficient documentation

## 2020-06-12 MED ORDER — HYDROCODONE-ACETAMINOPHEN 5-325 MG PO TABS
1.0000 | ORAL_TABLET | Freq: Once | ORAL | Status: AC
  Administered 2020-06-12: 1 via ORAL
  Filled 2020-06-12: qty 1

## 2020-06-12 MED ORDER — HYDROCODONE-ACETAMINOPHEN 5-325 MG PO TABS: 1.0000 | ORAL_TABLET | Freq: Four times a day (QID) | ORAL | 0 refills | Status: AC | PRN

## 2020-06-12 NOTE — ED Notes (Signed)
Pt in bed, pt c/o bilateral leg pain, pt is a bilateral aka, pt states that he has been having pain for some time and needs some pain pills. Pt denies phantom pain, pt stumps appear well healed and no wounds noted, pt states that his amputations were about 9 months ago.

## 2020-06-12 NOTE — ED Triage Notes (Signed)
Patient states bilateral leg pain patient is a bilateral amputee and states that his amputations were done about 1 year ago. Patient states that the pain started yesterday and that he has never had this type of pain before. Patient denies any other pain.

## 2020-06-12 NOTE — ED Provider Notes (Signed)
[facility] EMERGENCY DEPARTMENT Provider Note   CSN: [id number] Arrival date & time: 06/12/20  1930     History Chief Complaint  Patient presents with  . Leg Pain    bilateral    Terrence Carr is a 59 y.o. male.  Patient status post bilateral above-the-knee amputations.  One done in December another one done in January.  Patient states he started to have leg pain today.  At the stump.  But not up in the thigh.  Patient known to have peripheral artery disease.  Denies any fevers.  Or any swelling to the legs.  No chest pain no shortness of breath.        Past Medical History:  Diagnosis Date  . Hypertension     Patient Active Problem List   Diagnosis Date Noted  . S/P bilateral above knee amputation ([place]) 01/28/2020  . MSSA bacteremia 01/27/2020  . Gangrene ([place]) 01/25/2020  . PAD (peripheral artery disease) ([place]) 01/25/2020  . Pressure ulcer of left buttock, stage 2 ([place]) 01/01/2020  . Left above-knee amputee ([place]) 12/31/2019  . S/P AKA (above knee amputation) unilateral, left ([place])   . Osteomyelitis of multiple sites ([place])   . Thrombocytosis ([place]) 12/24/2019  . Hypoglycemia 12/24/2019  . Osteomyelitis of ankle or foot, acute, right ([place]) 12/23/2019  . Lactic acidosis 12/23/2019  . Alcohol abuse 12/23/2019  . Alcohol withdrawal ([place]) 12/23/2019  . Macrocytic anemia 12/23/2019  . Sacral decubitus ulcer 12/23/2019  . Hyperglycemia 12/23/2019  . Essential hypertension 12/23/2019  . Peripheral arterial disease ([place]) 12/23/2019  . Vitamin B12 deficiency 12/23/2019    Past Surgical History:  Procedure Laterality Date  . AMPUTATION Left 12/24/2019   Procedure: LEFT ABOVE KNEE AMPUTATION;  Surgeon: Rosetta Posner, MD;  Location: [facility];  Service: Vascular;  Laterality: Left;  . AMPUTATION Right 01/26/2020   Procedure: AMPUTATION ABOVE KNEE;  Surgeon: Waynetta Sandy, MD;  Location: [facility];  Service: Vascular;  Laterality: Right;  . FEMORAL-POPLITEAL BYPASS  GRAFT Right 12/30/2019   Procedure: RIGHT FEMORAL-POPLITEAL ARTERY BYPASS GRAFT;  Surgeon: Waynetta Sandy, MD;  Location: [facility];  Service: Vascular;  Laterality: Right;  . LOWER EXTREMITY ANGIOGRAPHY Right 12/28/2019   Procedure: LOWER EXTREMITY ANGIOGRAPHY;  Surgeon: Waynetta Sandy, MD;  Location: [facility];  Service: Cardiovascular;  Laterality: Right;       History reviewed. No pertinent family history.  Social History   Tobacco Use  . Smoking status: Current Every Day Smoker    Packs/day: 0.50  . Smokeless tobacco: Never Used  Substance Use Topics  . Alcohol use: Yes    Comment: 1/2 pint a night  . Drug use: Never    Home Medications Prior to Admission medications   Medication Sig Start Date End Date Taking? Authorizing Provider  acetaminophen (TYLENOL) 325 MG tablet Take 1-2 tablets (325-650 mg total) by mouth every 4 (four) hours as needed for mild pain (or temp >/= 101 F). 12/31/19   Pokhrel, Corrie Mckusick, MD  aspirin EC 81 MG EC tablet Take 1 tablet (81 mg total) by mouth daily. 01/01/20   Pokhrel, Corrie Mckusick, MD  atorvastatin (LIPITOR) 20 MG tablet Take 1 tablet (20 mg total) by mouth daily at 6 PM. 02/08/20   Angiulli, Lavon Paganini, PA-C  buPROPion [facility] SR) 100 MG 12 hr tablet Take 1 tablet (100 mg total) by mouth 2 (two) times daily. 02/08/20   Angiulli, Lavon Paganini, PA-C  cyanocobalamin 1000 MCG tablet Take 1 tablet (1,000  mcg total) by mouth daily. 02/08/20   Angiulli, Lavon Paganini, PA-C  docusate sodium (COLACE) 100 MG capsule Take 1 capsule (100 mg total) by mouth 2 (two) times daily. 12/31/19   Pokhrel, Corrie Mckusick, MD  folic acid (FOLVITE) 1 MG tablet Take 1 tablet (1 mg total) by mouth daily. 02/08/20   Angiulli, Lavon Paganini, PA-C  gabapentin (NEURONTIN) 100 MG capsule Take 1 capsule (100 mg total) by mouth 3 (three) times daily. 02/08/20   Angiulli, Lavon Paganini, PA-C  pantoprazole (PROTONIX) 40 MG tablet Take 1 tablet (40 mg total) by mouth daily. 02/08/20   Angiulli,  Lavon Paganini, PA-C  polyethylene glycol (MIRALAX / GLYCOLAX) 17 g packet Take 17 g by mouth daily as needed for mild constipation. 12/31/19   Pokhrel, Corrie Mckusick, MD  traMADol (ULTRAM) 50 MG tablet Take 1 tablet (50 mg total) by mouth every 6 (six) hours as needed for severe pain. 02/08/20   Angiulli, Lavon Paganini, PA-C  traZODone (DESYREL) 50 MG tablet Take 1 tablet (50 mg total) by mouth at bedtime. 02/08/20   Angiulli, Lavon Paganini, PA-C    Allergies    Ibuprofen and Other  Review of Systems   Review of Systems  Constitutional: Negative for chills and fever.  HENT: Negative for congestion, rhinorrhea and sore throat.   Eyes: Negative for visual disturbance.  Respiratory: Negative for cough and shortness of breath.   Cardiovascular: Negative for chest pain and leg swelling.  Gastrointestinal: Negative for abdominal pain, diarrhea, nausea and vomiting.  Genitourinary: Negative for dysuria.  Musculoskeletal: Negative for back pain, joint swelling and neck pain.  Skin: Negative for rash.  Neurological: Negative for dizziness, light-headedness and headaches.  Hematological: Does not bruise/bleed easily.  Psychiatric/Behavioral: Negative for confusion.    Physical Exam Updated Vital Signs BP 138/71 (BP Location: Left Arm)   Pulse 60   Temp 98.3 F (36.8 C) (Oral)   Resp 17   Ht 1.651 m (5\' 5" )   Wt 76.2 kg   SpO2 98%   BMI 27.96 kg/m   Physical Exam Vitals and nursing note reviewed.  Constitutional:      Appearance: Normal appearance. He is well-developed.  HENT:     Head: Normocephalic and atraumatic.  Eyes:     Extraocular Movements: Extraocular movements intact.     Conjunctiva/sclera: Conjunctivae normal.     Pupils: Pupils are equal, round, and reactive to light.  Cardiovascular:     Rate and Rhythm: Normal rate and regular rhythm.     Heart sounds: No murmur heard.   Pulmonary:     Effort: Pulmonary effort is normal. No respiratory distress.     Breath sounds: Normal breath  sounds.  Abdominal:     Palpations: Abdomen is soft.     Tenderness: There is no abdominal tenderness.  Musculoskeletal:        General: No swelling, tenderness or deformity.     Cervical back: Normal range of motion and neck supple.     Comments: Bilateral above-the-knee amputations.  Both stumps appear to be well-healed.  No swelling.  No tenderness to palpation.  Has femoral pulses bilaterally.  Skin:    General: Skin is warm and dry.  Neurological:     General: No focal deficit present.     Mental Status: He is alert and oriented to person, place, and time.     Cranial Nerves: No cranial nerve deficit.     Sensory: No sensory deficit.     Motor: No weakness.  ED Results / Procedures / Treatments   Labs (all labs ordered are listed, but only abnormal results are displayed) Labs Reviewed - No data to display  EKG None  Radiology No results found.  Procedures Procedures (including critical care time)  Medications Ordered in ED Medications  HYDROcodone-acetaminophen (NORCO/VICODIN) 5-325 MG per tablet 1 tablet (1 tablet Oral Given 06/12/20 2305)    ED Course  I have reviewed the triage vital signs and the nursing notes.  Pertinent labs & imaging results that were available during my care of the patient were reviewed by me and considered in my medical decision making (see chart for details).    MDM Rules/Calculators/A&P                         Patient's vital signs here not febrile not tachycardic not hypotensive.  Patient's bilateral above-the-knee amputation sites appear to be healing well.  He has got femoral pulses.  I feel that patient is not having ischemic pain.  Has not had trouble with phantom pain in the past.  We will go ahead and treat with hydrocodone.  Also abdomen soft and nontender.  No evidence of any lower extremity ischemia.  We will have patient trial with hydrocodone for the pain and follow-up back with vascular surgery.  Final Clinical  Impression(s) / ED Diagnoses Final diagnoses:  Bilateral leg pain    Rx / DC Orders ED Discharge Orders    None       Fredia Sorrow, MD 06/12/20 2316

## 2020-06-12 NOTE — Discharge Instructions (Signed)
Take the hydrocodone as needed for pain.  Make an appointment to follow-up with vascular surgery.  To have both lower extremities rechecked.  No evidence of acute ischemia here tonight.  Return for any new or worse symptoms.

## 2020-06-13 MED FILL — Hydrocodone-Acetaminophen Tab 5-325 MG: ORAL | Qty: 6 | Status: AC

## 2020-06-13 NOTE — ED Notes (Signed)
Pt in bed, pt states that he has no decrease in pain at this time, to go pack of pain pills given, pt signed prescription and script was placed in pharmacy box, reviewed medications with pt and advised they may make him sleepy and to not drink, drive or operate machinery, pt verbalized understanding.  Asked pt how he was getting home, states that he would like to stay here tonight explained that this wouldn't be an option, pt said that he could try to call someone but they wouldn't want to drive at night, reviewed d/c instructions with pt, helped pt into a wc, pt from dpt.

## 2021-07-13 IMAGING — DX DG TIBIA/FIBULA 2V*R*
4 series · 4 of 4 positions shown · non-contrast
Comparison: Concurrent ankle radiographs

CLINICAL DATA: Wounds, assess for ostia

EXAM:
RIGHT TIBIA AND FIBULA - 2 VIEW

[tibia ap (1 of 2)]
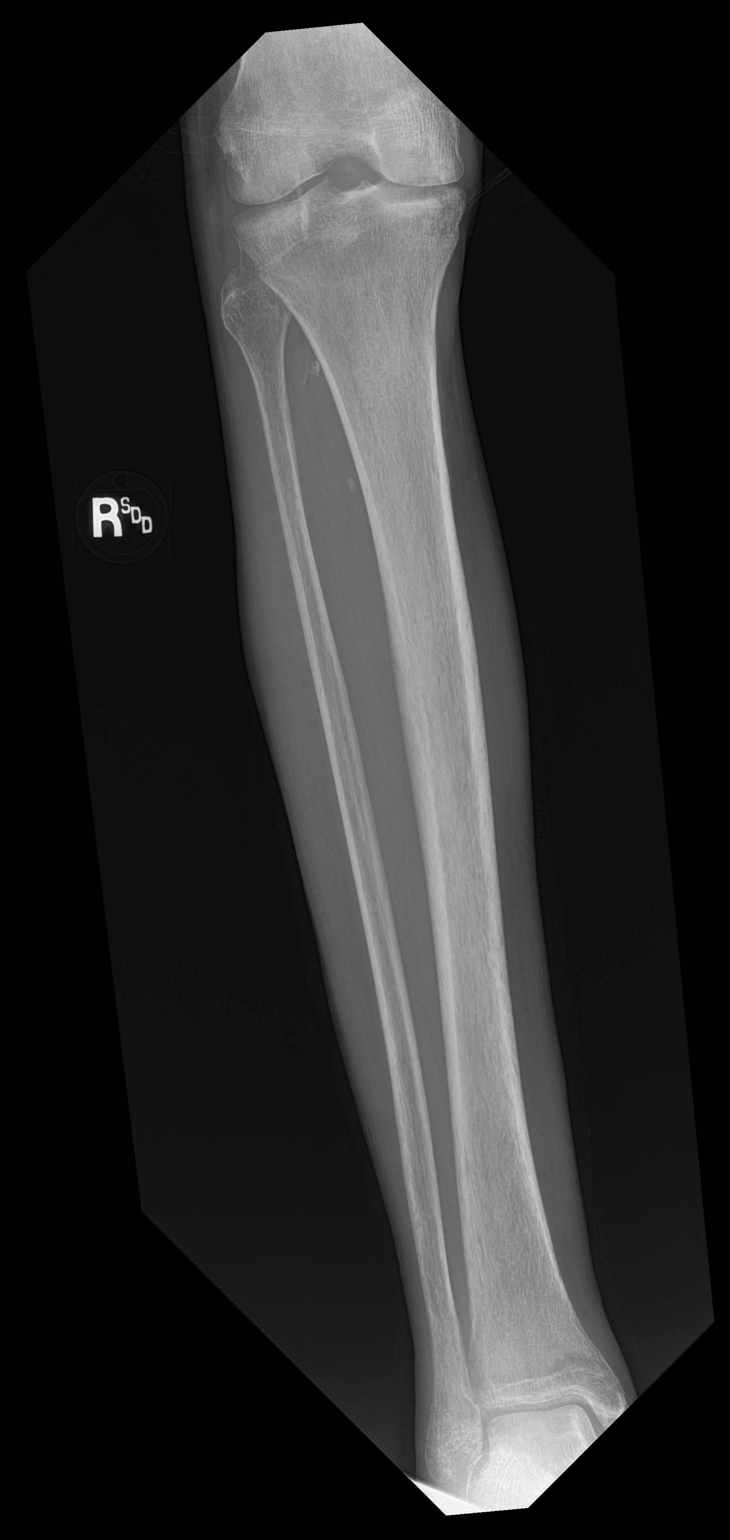

[tibia ap (2 of 2)]
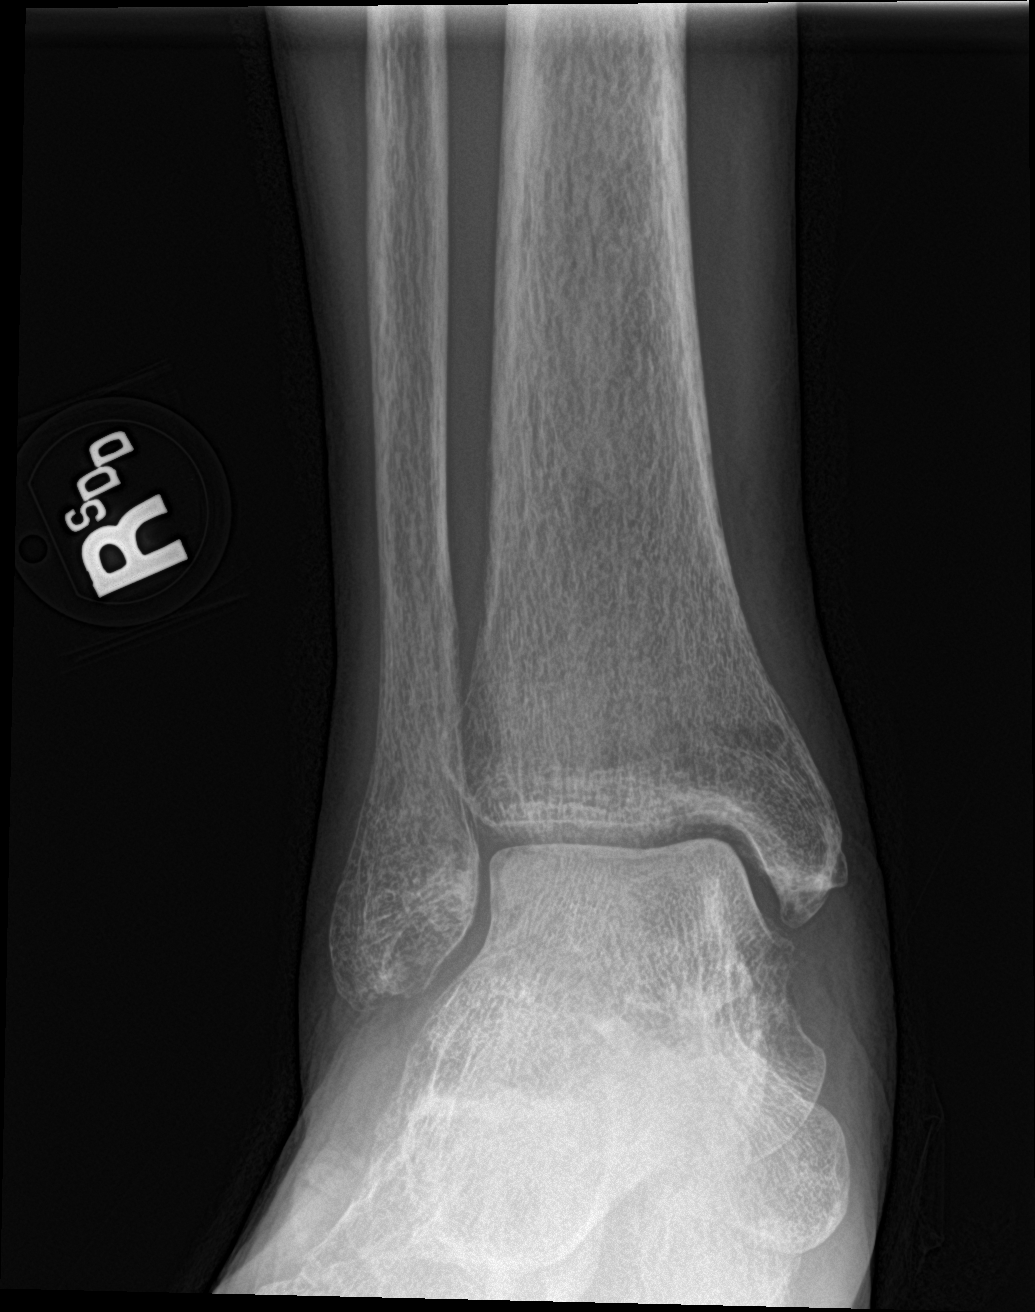

[tibia lat (1 of 2)]
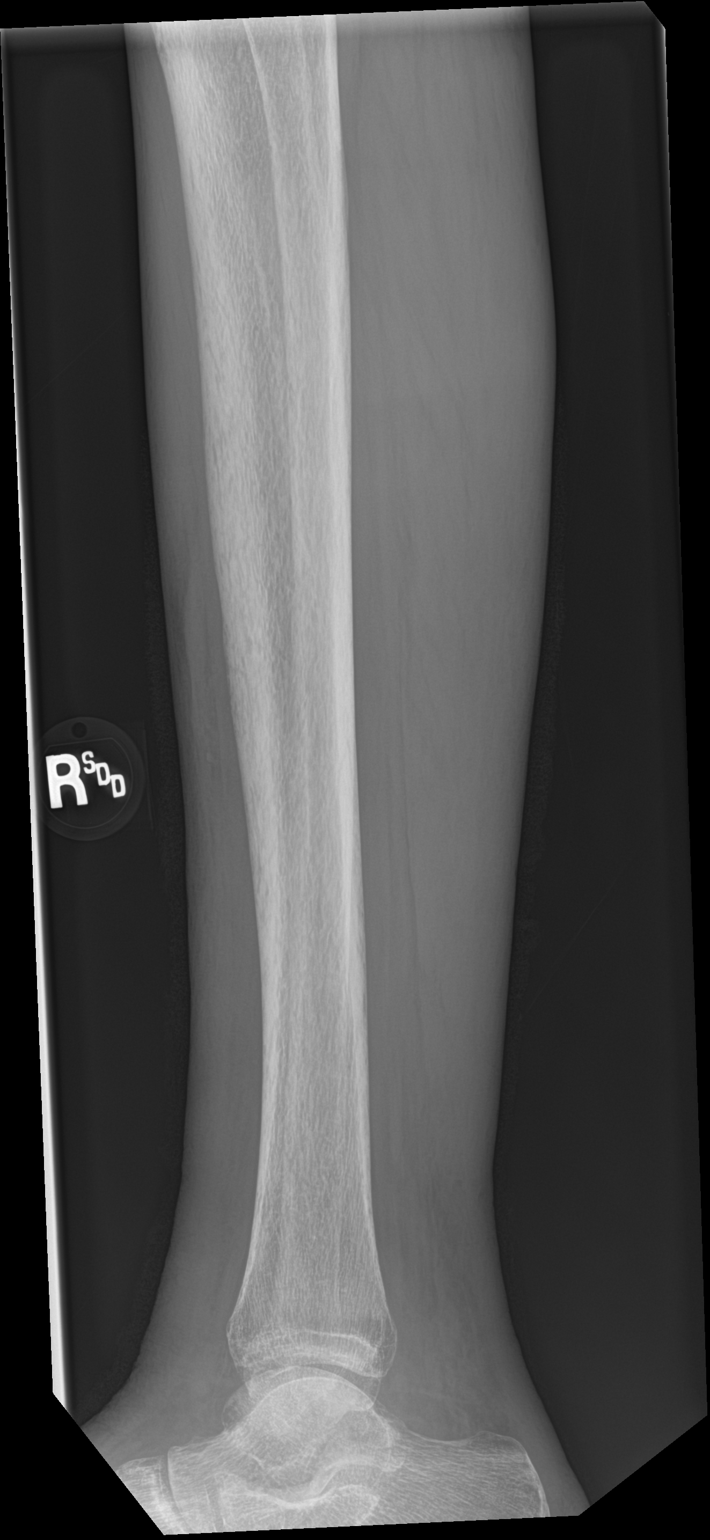

[tibia lat (2 of 2)]
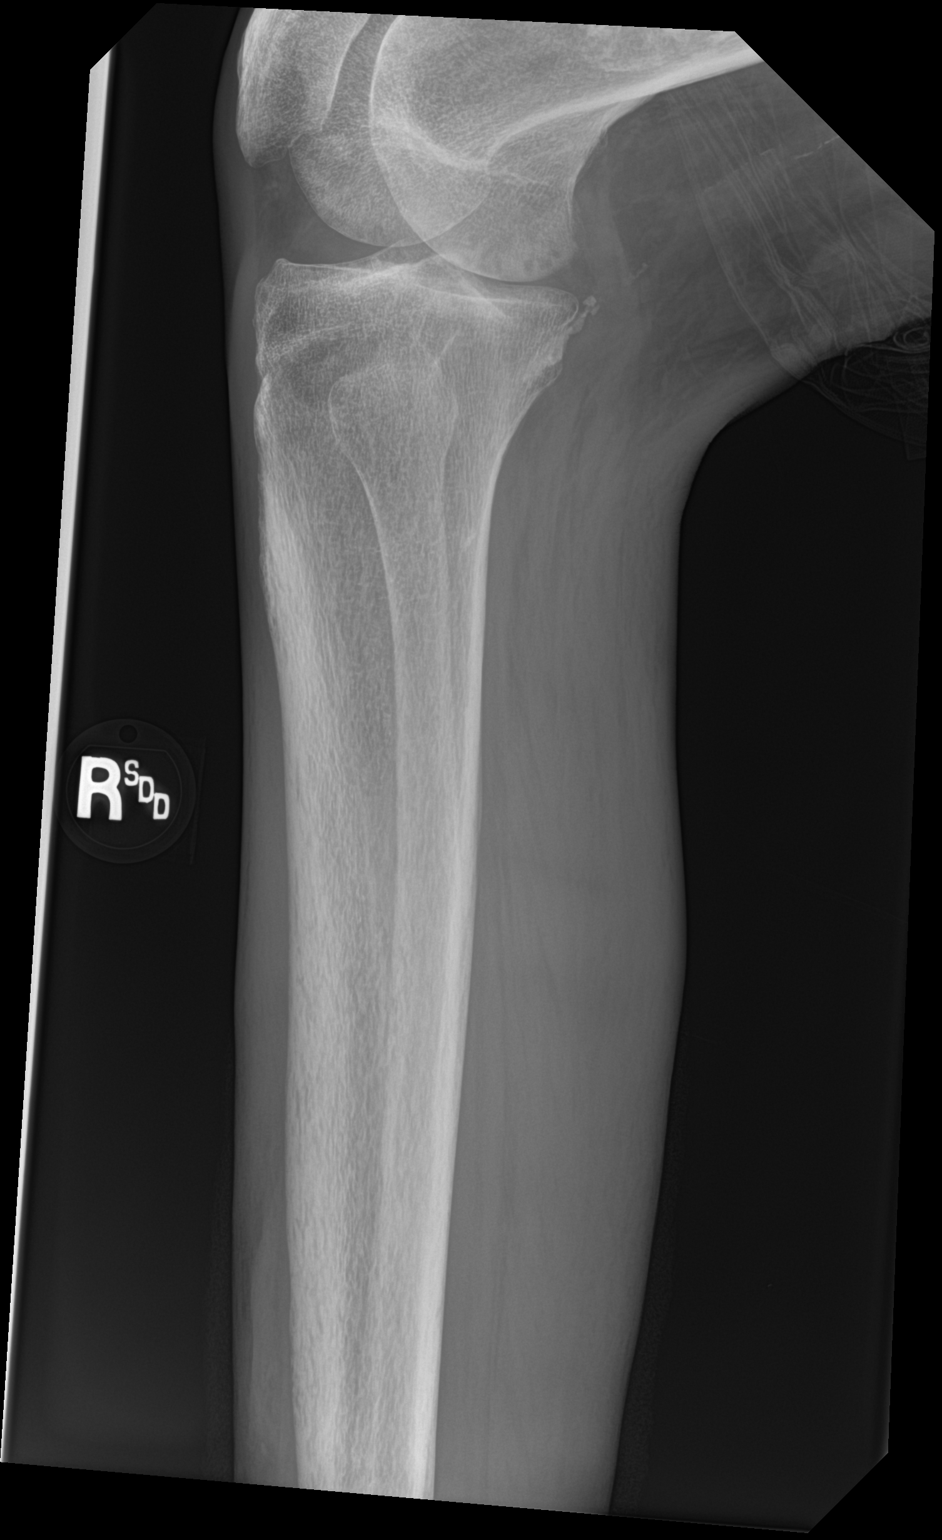

[4 of 4 positions shown; findings below may reference images not displayed]

FINDINGS: Diffuse edematous thickening of the right lower extremity which is
increasing towards the level of the foot. There is circumferential
swelling at the level of the ankle with a large effusion and some
questionable gas suspicious lucency noted in the subcortical bone of
the distal tibial metaphysis. Mild tricompartmental degenerative
changes are seen at the knee. Vascular calcium in the soft tissues.
No acute fracture or traumatic malalignment.
IMPRESSION: 1. Diffuse edematous thickening of the right lower extremity which
is increasing towards the level of the foot.
2. There is a large ankle effusion, questionable gas in the joint,
and circumferential swelling at the level of the ankle.
3. Subcortical lucency at the distal tibial metaphysis, worrisome
for osteomyelitis.

## 2021-07-13 IMAGING — DX DG TIBIA/FIBULA 2V*L*
4 series · 4 of 4 positions shown · non-contrast
Comparison: None

CLINICAL DATA: Multiple wounds. Assessment for osteomyelitis.

EXAM:
LEFT TIBIA AND FIBULA - 2 VIEW

[tibia ap (1 of 2)]
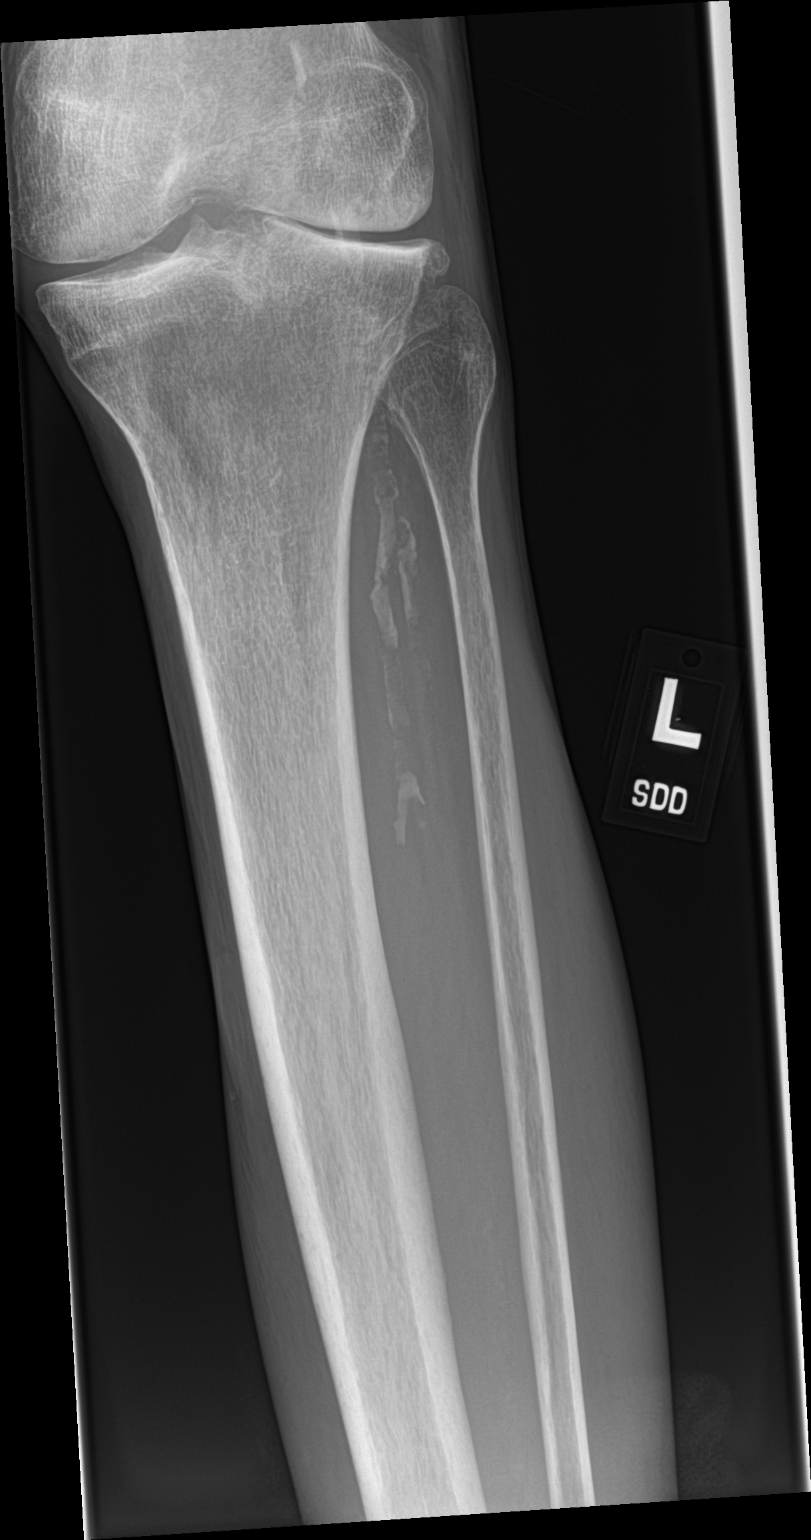

[tibia ap (2 of 2)]
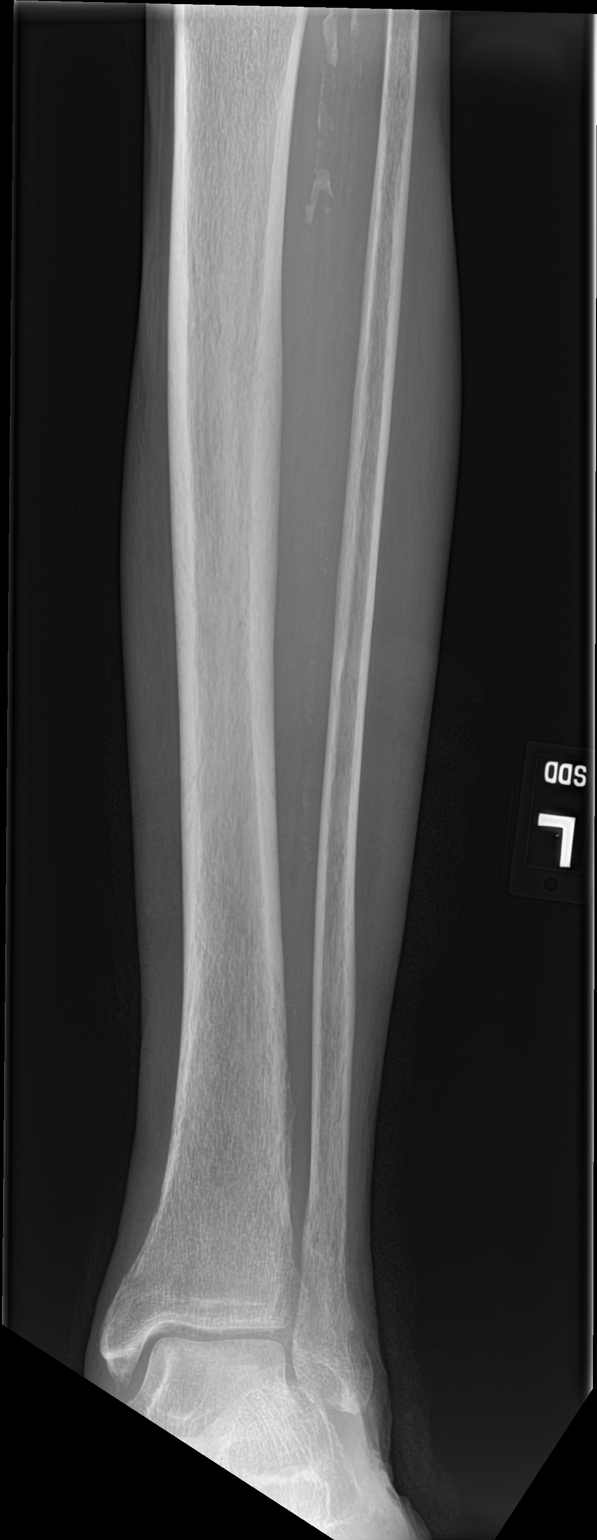

[tibia lat (1 of 2)]
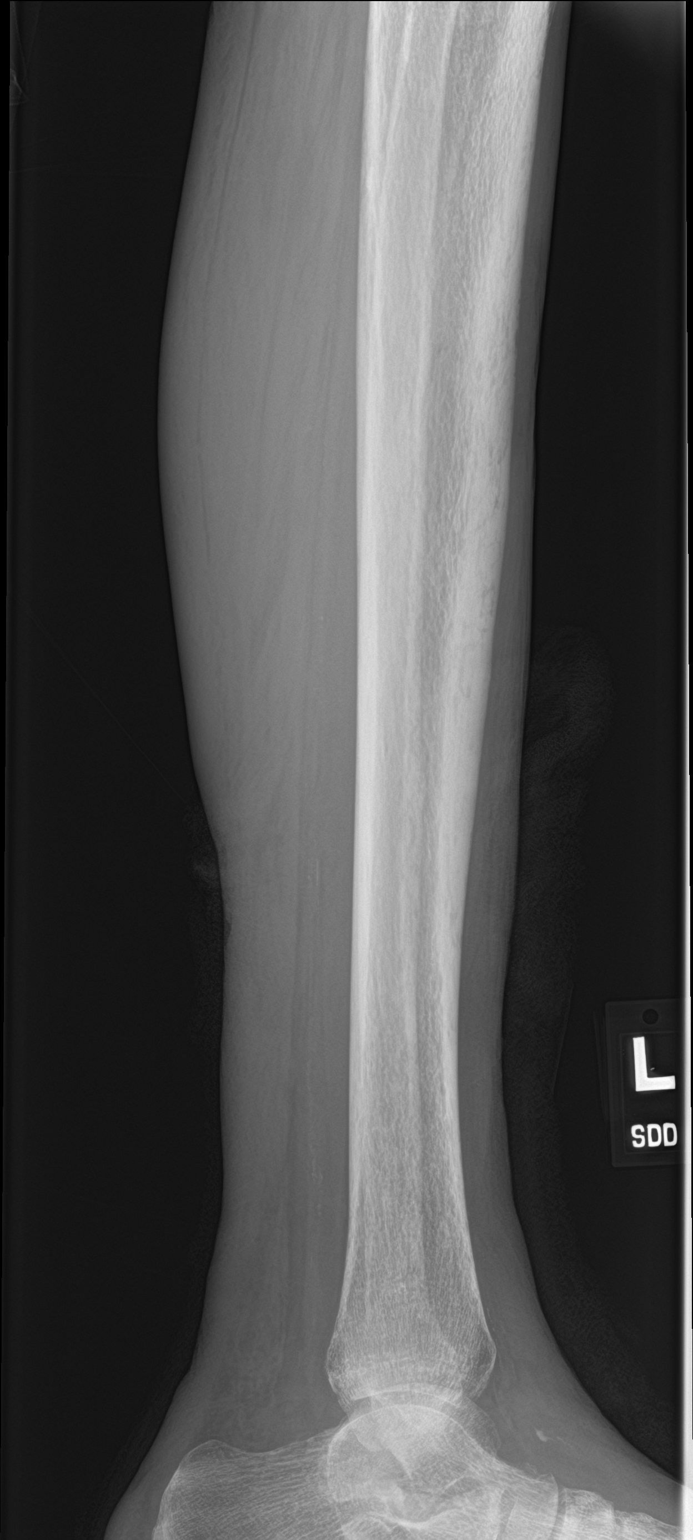

[tibia lat (2 of 2)]
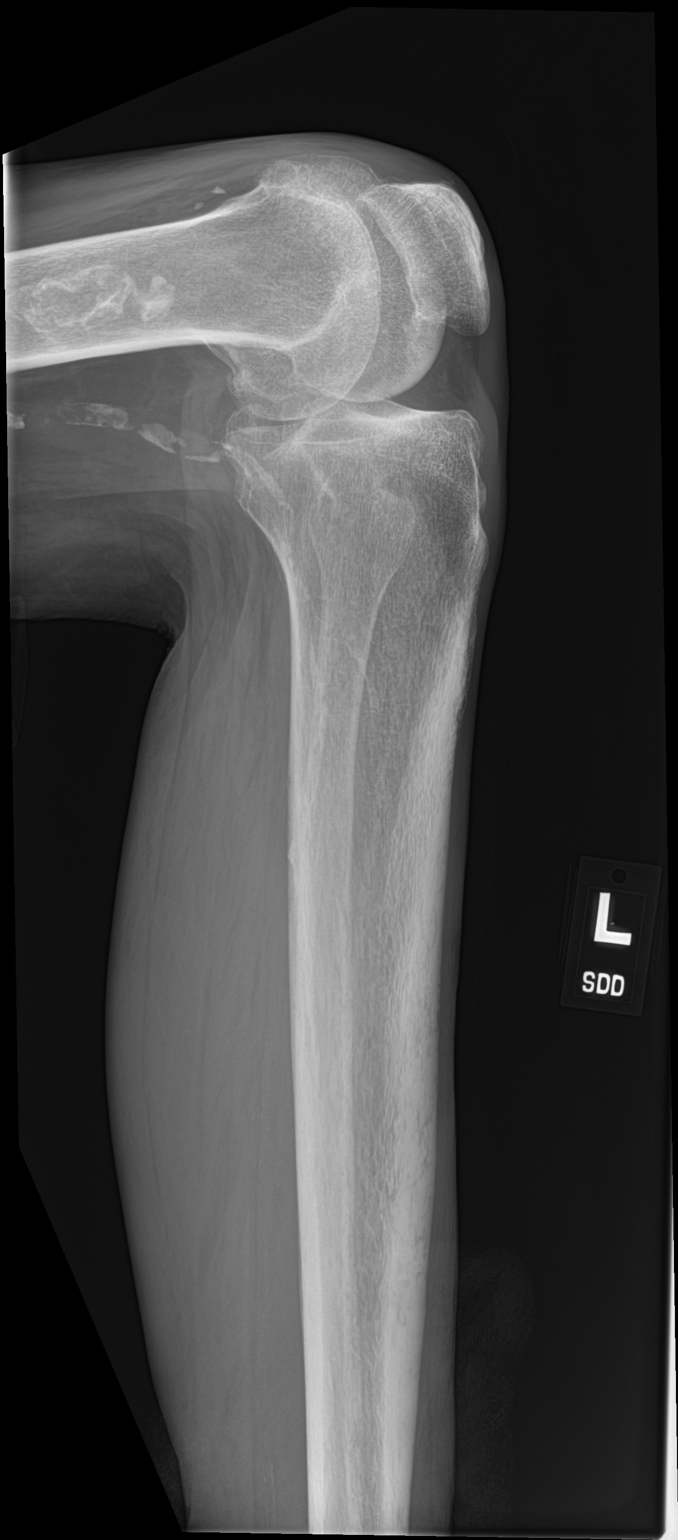

[4 of 4 positions shown; findings below may reference images not displayed]

FINDINGS: Diffuse mild soft tissue swelling of the left lower leg. Question
some subcortical lucency along the distal fibular metadiaphysis with
overlying material. If there is visible ulceration at this location,
could reflect early features of osteomyelitis. No other erosion,
destructive change, or immature periostitis to suggest a discrete
focus of infection. Few fragments in the suprapatellar joint recess
of the left knee, may be posttraumatic or degenerative in nature.
Serpiginous sclerosis in the distal femur likely sequela of prior
infarct. Atherosclerotic calcifications noted in the posterior knee.
IMPRESSION: 1. Question subtle subcortical lucency along the distal fibular
metadiaphysis with overlying bandage material. If there is visible
ulceration at this location, could reflect early features of
osteomyelitis.
2. No other erosion, destructive change, or immature periostitis to
suggest a discrete focus of infection.

## 2021-07-13 IMAGING — DX DG FOOT COMPLETE 3+V*L*
3 series · 3 of 3 positions shown · non-contrast
Comparison: None.

CLINICAL DATA: Wounds, assess for osteomyelitis

EXAM:
LEFT FOOT - COMPLETE 3+ VIEW

[foot ap]
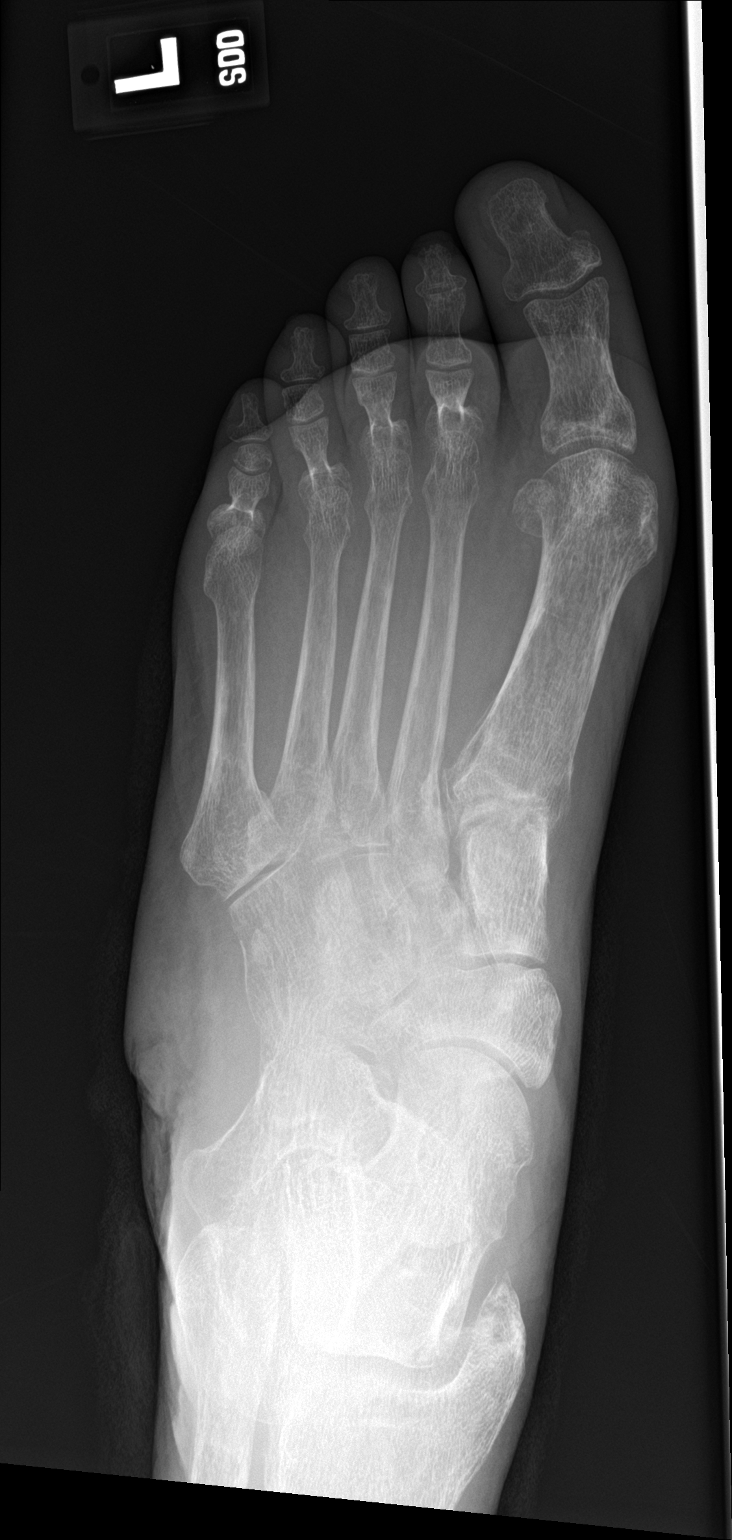

[foot obl]
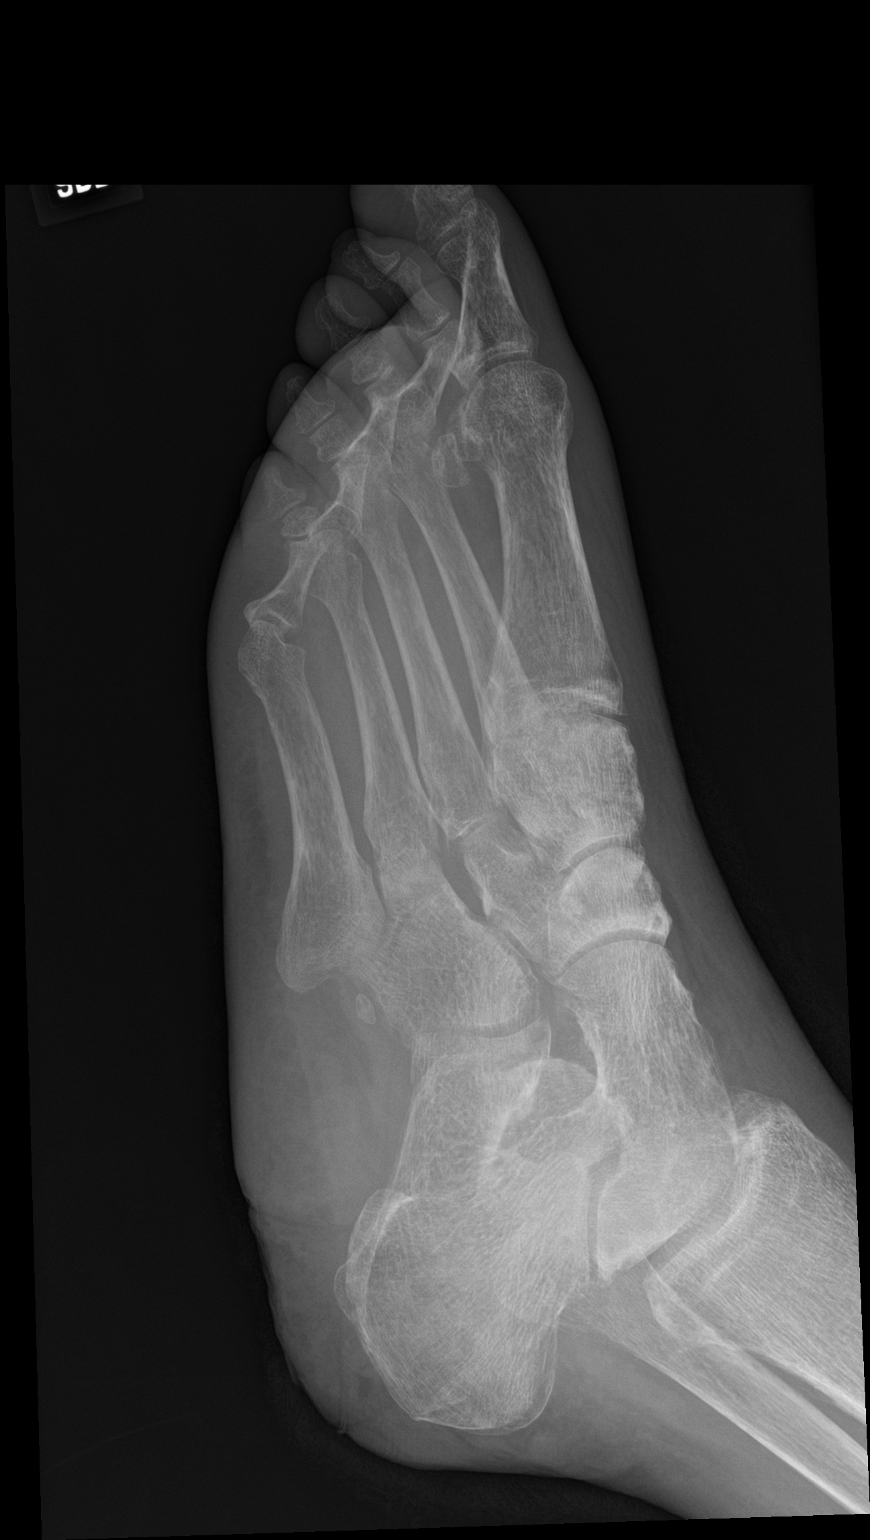

[foot lat]
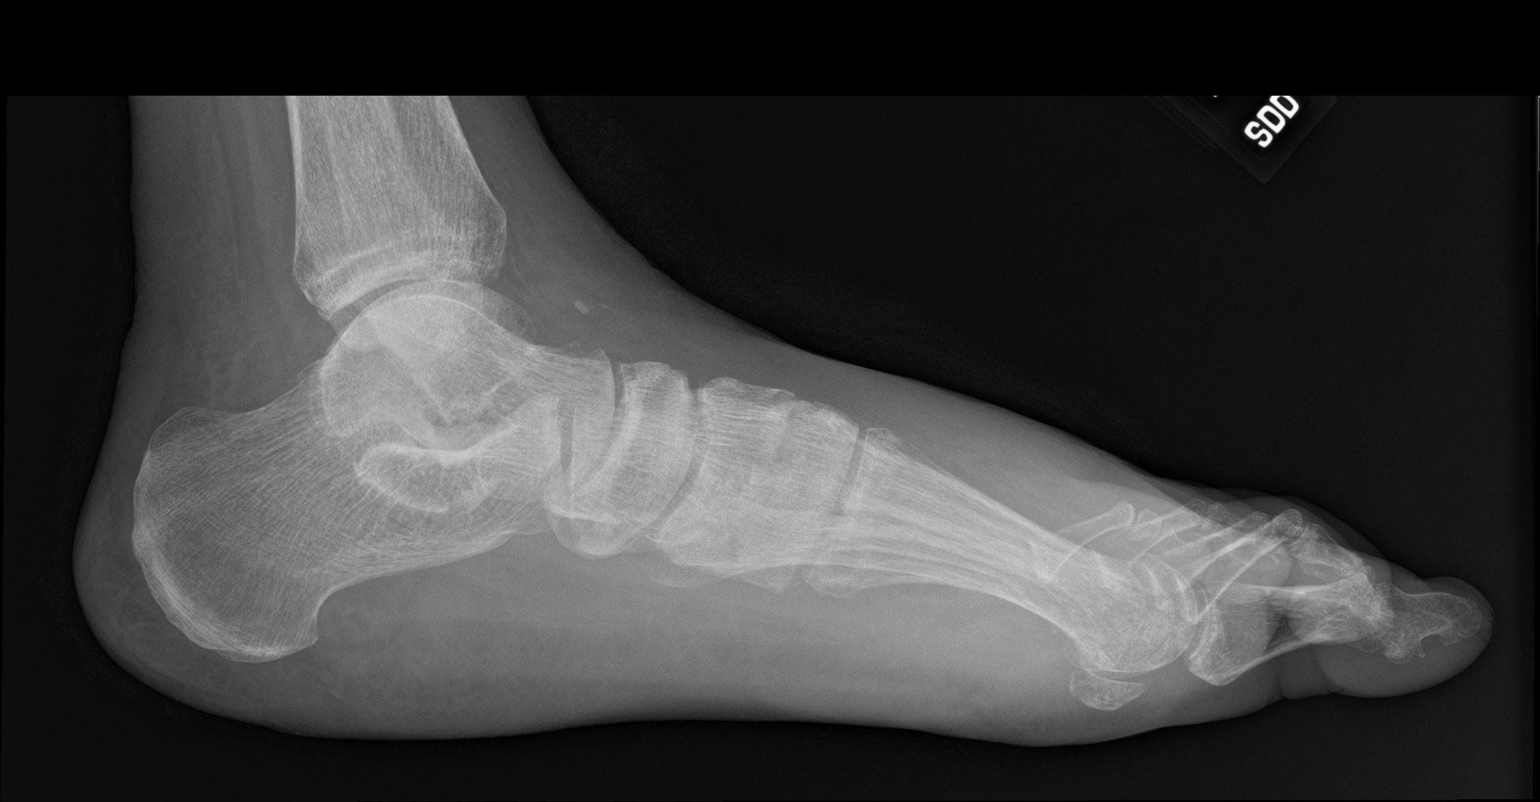

[3 of 3 positions shown; findings below may reference images not displayed]

FINDINGS: Diffuse soft tissue swelling of the foot. There is ulceration noted
along the plantar aspect of the heel and posterolateral aspect of
the foot. No subjacent osseous erosion, cortical lucency or immature
periostitis to suggest site of osteomyelitis. No other
radiographically evident site of ulceration. Vascular calcium noted
in the soft tissues. Diffuse degenerative changes in the midfoot are
noted most pronounced involving navicular, medial cuneiform and base
of the first metatarsal. Clawtoe deformities of the second through
fifth digits.
IMPRESSION: 1. Soft tissue ulceration along the plantar aspect of the heel and
posterolateral aspect of the foot.
2. No convincing radiographic evidence for osteomyelitis. If there
is persisting concern contrast-enhanced MRI is more sensitive and
specific.
3. Diffuse soft tissue swelling of the foot.
4. Diffuse degenerative changes in the midfoot.
